# Patient Record
Sex: Female | Born: 1965 | Race: White | Hispanic: No | Marital: Single | Smoking: Current every day smoker
Health system: Southern US, Community
[De-identification: ages and names within clinical notes are randomized; demographics above are authoritative.]

## PROBLEM LIST (undated history)

## (undated) DIAGNOSIS — J449 Chronic obstructive pulmonary disease, unspecified: Secondary | ICD-10-CM

## (undated) DIAGNOSIS — A159 Respiratory tuberculosis unspecified: Secondary | ICD-10-CM

## (undated) DIAGNOSIS — J189 Pneumonia, unspecified organism: Secondary | ICD-10-CM

## (undated) DIAGNOSIS — F410 Panic disorder [episodic paroxysmal anxiety] without agoraphobia: Secondary | ICD-10-CM

## (undated) DIAGNOSIS — M549 Dorsalgia, unspecified: Secondary | ICD-10-CM

## (undated) DIAGNOSIS — G43909 Migraine, unspecified, not intractable, without status migrainosus: Secondary | ICD-10-CM

## (undated) DIAGNOSIS — F329 Major depressive disorder, single episode, unspecified: Secondary | ICD-10-CM

## (undated) DIAGNOSIS — F609 Personality disorder, unspecified: Secondary | ICD-10-CM

## (undated) DIAGNOSIS — F32A Depression, unspecified: Secondary | ICD-10-CM

## (undated) DIAGNOSIS — M542 Cervicalgia: Secondary | ICD-10-CM

## (undated) DIAGNOSIS — G8929 Other chronic pain: Secondary | ICD-10-CM

## (undated) DIAGNOSIS — R51 Headache: Secondary | ICD-10-CM

## (undated) DIAGNOSIS — O99519 Diseases of the respiratory system complicating pregnancy, unspecified trimester: Secondary | ICD-10-CM

## (undated) DIAGNOSIS — F319 Bipolar disorder, unspecified: Secondary | ICD-10-CM

## (undated) HISTORY — PX: NECK SURGERY: SHX720

## (undated) HISTORY — DX: Chronic obstructive pulmonary disease, unspecified: J44.9

## (undated) HISTORY — PX: APPENDECTOMY: SHX54

## (undated) HISTORY — DX: Diseases of the respiratory system complicating pregnancy, unspecified trimester: O99.519

## (undated) HISTORY — PX: ABDOMINAL HYSTERECTOMY: SHX81

## (undated) HISTORY — DX: Pneumonia, unspecified organism: J18.9

## (undated) HISTORY — PX: CHOLECYSTECTOMY: SHX55

## (undated) HISTORY — DX: Respiratory tuberculosis unspecified: A15.9

---

## 2001-06-10 ENCOUNTER — Emergency Department (HOSPITAL_COMMUNITY): Admission: EM | Admit: 2001-06-10 | Discharge: 2001-06-10 | Payer: Self-pay | Admitting: Emergency Medicine

## 2002-02-03 ENCOUNTER — Encounter: Payer: Self-pay | Admitting: Emergency Medicine

## 2002-02-03 ENCOUNTER — Emergency Department (HOSPITAL_COMMUNITY): Admission: EM | Admit: 2002-02-03 | Discharge: 2002-02-04 | Payer: Self-pay | Admitting: Emergency Medicine

## 2002-03-25 ENCOUNTER — Ambulatory Visit (HOSPITAL_COMMUNITY): Admission: RE | Admit: 2002-03-25 | Discharge: 2002-03-25 | Payer: Self-pay | Admitting: Family Medicine

## 2002-03-25 ENCOUNTER — Encounter: Payer: Self-pay | Admitting: Family Medicine

## 2002-04-02 ENCOUNTER — Ambulatory Visit (HOSPITAL_COMMUNITY): Admission: RE | Admit: 2002-04-02 | Discharge: 2002-04-02 | Payer: Self-pay | Admitting: Family Medicine

## 2002-04-02 ENCOUNTER — Encounter: Payer: Self-pay | Admitting: Family Medicine

## 2002-05-29 ENCOUNTER — Inpatient Hospital Stay (HOSPITAL_COMMUNITY): Admission: AD | Admit: 2002-05-29 | Discharge: 2002-05-30 | Payer: Self-pay | Admitting: Family Medicine

## 2002-05-30 ENCOUNTER — Encounter: Payer: Self-pay | Admitting: Family Medicine

## 2002-07-01 ENCOUNTER — Encounter: Payer: Self-pay | Admitting: Family Medicine

## 2002-07-01 ENCOUNTER — Ambulatory Visit (HOSPITAL_COMMUNITY): Admission: RE | Admit: 2002-07-01 | Discharge: 2002-07-01 | Payer: Self-pay | Admitting: Family Medicine

## 2003-01-13 ENCOUNTER — Other Ambulatory Visit: Admission: RE | Admit: 2003-01-13 | Discharge: 2003-01-13 | Payer: Self-pay | Admitting: Obstetrics & Gynecology

## 2003-04-30 ENCOUNTER — Encounter: Payer: Self-pay | Admitting: *Deleted

## 2003-04-30 ENCOUNTER — Emergency Department (HOSPITAL_COMMUNITY): Admission: EM | Admit: 2003-04-30 | Discharge: 2003-04-30 | Payer: Self-pay | Admitting: *Deleted

## 2003-05-09 ENCOUNTER — Ambulatory Visit (HOSPITAL_COMMUNITY): Admission: RE | Admit: 2003-05-09 | Discharge: 2003-05-09 | Payer: Self-pay | Admitting: Family Medicine

## 2003-05-12 ENCOUNTER — Encounter: Payer: Self-pay | Admitting: Family Medicine

## 2003-05-12 ENCOUNTER — Ambulatory Visit (HOSPITAL_COMMUNITY): Admission: RE | Admit: 2003-05-12 | Discharge: 2003-05-12 | Payer: Self-pay | Admitting: Family Medicine

## 2003-10-20 ENCOUNTER — Emergency Department (HOSPITAL_COMMUNITY): Admission: EM | Admit: 2003-10-20 | Discharge: 2003-10-21 | Payer: Self-pay | Admitting: Emergency Medicine

## 2003-10-21 ENCOUNTER — Emergency Department (HOSPITAL_COMMUNITY): Admission: EM | Admit: 2003-10-21 | Discharge: 2003-10-21 | Payer: Self-pay | Admitting: Emergency Medicine

## 2003-11-29 ENCOUNTER — Emergency Department (HOSPITAL_COMMUNITY): Admission: EM | Admit: 2003-11-29 | Discharge: 2003-11-29 | Payer: Self-pay | Admitting: Emergency Medicine

## 2004-02-04 ENCOUNTER — Ambulatory Visit (HOSPITAL_COMMUNITY): Admission: RE | Admit: 2004-02-04 | Discharge: 2004-02-04 | Payer: Self-pay | Admitting: Family Medicine

## 2004-02-09 ENCOUNTER — Emergency Department (HOSPITAL_COMMUNITY): Admission: EM | Admit: 2004-02-09 | Discharge: 2004-02-09 | Payer: Self-pay | Admitting: Emergency Medicine

## 2004-11-25 ENCOUNTER — Emergency Department (HOSPITAL_COMMUNITY): Admission: EM | Admit: 2004-11-25 | Discharge: 2004-11-25 | Payer: Self-pay | Admitting: Emergency Medicine

## 2004-11-26 ENCOUNTER — Ambulatory Visit: Payer: Self-pay | Admitting: Internal Medicine

## 2004-11-26 ENCOUNTER — Ambulatory Visit (HOSPITAL_COMMUNITY): Admission: RE | Admit: 2004-11-26 | Discharge: 2004-11-26 | Payer: Self-pay | Admitting: Internal Medicine

## 2005-02-13 ENCOUNTER — Emergency Department (HOSPITAL_COMMUNITY): Admission: EM | Admit: 2005-02-13 | Discharge: 2005-02-13 | Payer: Self-pay | Admitting: Emergency Medicine

## 2005-03-27 ENCOUNTER — Emergency Department (HOSPITAL_COMMUNITY): Admission: EM | Admit: 2005-03-27 | Discharge: 2005-03-27 | Payer: Self-pay | Admitting: *Deleted

## 2005-05-08 ENCOUNTER — Emergency Department (HOSPITAL_COMMUNITY): Admission: EM | Admit: 2005-05-08 | Discharge: 2005-05-09 | Payer: Self-pay | Admitting: Emergency Medicine

## 2005-08-11 ENCOUNTER — Emergency Department (HOSPITAL_COMMUNITY): Admission: EM | Admit: 2005-08-11 | Discharge: 2005-08-11 | Payer: Self-pay | Admitting: Emergency Medicine

## 2005-08-29 ENCOUNTER — Emergency Department (HOSPITAL_COMMUNITY): Admission: EM | Admit: 2005-08-29 | Discharge: 2005-08-29 | Payer: Self-pay | Admitting: Emergency Medicine

## 2005-09-19 ENCOUNTER — Ambulatory Visit (HOSPITAL_COMMUNITY): Admission: RE | Admit: 2005-09-19 | Discharge: 2005-09-19 | Payer: Self-pay | Admitting: Family Medicine

## 2006-02-15 ENCOUNTER — Emergency Department (HOSPITAL_COMMUNITY): Admission: EM | Admit: 2006-02-15 | Discharge: 2006-02-16 | Payer: Self-pay | Admitting: Emergency Medicine

## 2006-02-17 ENCOUNTER — Emergency Department (HOSPITAL_COMMUNITY): Admission: EM | Admit: 2006-02-17 | Discharge: 2006-02-17 | Payer: Self-pay | Admitting: Emergency Medicine

## 2006-05-09 ENCOUNTER — Emergency Department (HOSPITAL_COMMUNITY): Admission: EM | Admit: 2006-05-09 | Discharge: 2006-05-10 | Payer: Self-pay | Admitting: Emergency Medicine

## 2006-06-14 ENCOUNTER — Emergency Department (HOSPITAL_COMMUNITY): Admission: EM | Admit: 2006-06-14 | Discharge: 2006-06-14 | Payer: Self-pay | Admitting: Emergency Medicine

## 2006-07-20 ENCOUNTER — Emergency Department (HOSPITAL_COMMUNITY): Admission: EM | Admit: 2006-07-20 | Discharge: 2006-07-20 | Payer: Self-pay | Admitting: Emergency Medicine

## 2006-08-10 ENCOUNTER — Ambulatory Visit (HOSPITAL_COMMUNITY): Admission: RE | Admit: 2006-08-10 | Discharge: 2006-08-10 | Payer: Self-pay | Admitting: Family Medicine

## 2006-09-15 ENCOUNTER — Ambulatory Visit (HOSPITAL_COMMUNITY): Admission: RE | Admit: 2006-09-15 | Discharge: 2006-09-16 | Payer: Self-pay | Admitting: Neurosurgery

## 2006-10-24 ENCOUNTER — Emergency Department (HOSPITAL_COMMUNITY): Admission: EM | Admit: 2006-10-24 | Discharge: 2006-10-24 | Payer: Self-pay | Admitting: Emergency Medicine

## 2006-11-04 ENCOUNTER — Emergency Department (HOSPITAL_COMMUNITY): Admission: EM | Admit: 2006-11-04 | Discharge: 2006-11-04 | Payer: Self-pay | Admitting: Emergency Medicine

## 2006-12-04 ENCOUNTER — Emergency Department (HOSPITAL_COMMUNITY): Admission: EM | Admit: 2006-12-04 | Discharge: 2006-12-04 | Payer: Self-pay | Admitting: Emergency Medicine

## 2006-12-09 ENCOUNTER — Emergency Department (HOSPITAL_COMMUNITY): Admission: EM | Admit: 2006-12-09 | Discharge: 2006-12-09 | Payer: Self-pay | Admitting: Emergency Medicine

## 2007-01-18 ENCOUNTER — Emergency Department (HOSPITAL_COMMUNITY): Admission: EM | Admit: 2007-01-18 | Discharge: 2007-01-18 | Payer: Self-pay | Admitting: Emergency Medicine

## 2007-02-09 ENCOUNTER — Emergency Department (HOSPITAL_COMMUNITY): Admission: EM | Admit: 2007-02-09 | Discharge: 2007-02-09 | Payer: Self-pay | Admitting: Emergency Medicine

## 2007-02-25 ENCOUNTER — Emergency Department (HOSPITAL_COMMUNITY): Admission: EM | Admit: 2007-02-25 | Discharge: 2007-02-25 | Payer: Self-pay | Admitting: Emergency Medicine

## 2007-04-14 ENCOUNTER — Emergency Department: Payer: Self-pay | Admitting: Emergency Medicine

## 2007-04-21 ENCOUNTER — Emergency Department: Payer: Self-pay | Admitting: Emergency Medicine

## 2007-04-26 ENCOUNTER — Emergency Department: Payer: Self-pay | Admitting: Internal Medicine

## 2007-05-03 ENCOUNTER — Emergency Department: Payer: Self-pay | Admitting: Emergency Medicine

## 2007-05-07 ENCOUNTER — Emergency Department: Payer: Self-pay | Admitting: Emergency Medicine

## 2007-05-21 ENCOUNTER — Emergency Department: Payer: Self-pay | Admitting: Emergency Medicine

## 2007-07-11 ENCOUNTER — Emergency Department (HOSPITAL_COMMUNITY): Admission: EM | Admit: 2007-07-11 | Discharge: 2007-07-11 | Payer: Self-pay | Admitting: Emergency Medicine

## 2007-12-16 ENCOUNTER — Emergency Department (HOSPITAL_COMMUNITY): Admission: EM | Admit: 2007-12-16 | Discharge: 2007-12-16 | Payer: Self-pay | Admitting: Emergency Medicine

## 2008-01-27 ENCOUNTER — Emergency Department (HOSPITAL_COMMUNITY): Admission: EM | Admit: 2008-01-27 | Discharge: 2008-01-27 | Payer: Self-pay | Admitting: Emergency Medicine

## 2008-05-27 ENCOUNTER — Emergency Department (HOSPITAL_COMMUNITY): Admission: EM | Admit: 2008-05-27 | Discharge: 2008-05-27 | Payer: Self-pay | Admitting: Emergency Medicine

## 2008-10-31 ENCOUNTER — Emergency Department (HOSPITAL_COMMUNITY): Admission: EM | Admit: 2008-10-31 | Discharge: 2008-10-31 | Payer: Self-pay | Admitting: Emergency Medicine

## 2009-03-29 ENCOUNTER — Emergency Department (HOSPITAL_COMMUNITY): Admission: EM | Admit: 2009-03-29 | Discharge: 2009-03-29 | Payer: Self-pay | Admitting: Emergency Medicine

## 2009-04-25 ENCOUNTER — Emergency Department (HOSPITAL_COMMUNITY): Admission: EM | Admit: 2009-04-25 | Discharge: 2009-04-26 | Payer: Self-pay | Admitting: Emergency Medicine

## 2009-05-13 ENCOUNTER — Emergency Department (HOSPITAL_COMMUNITY): Admission: EM | Admit: 2009-05-13 | Discharge: 2009-05-13 | Payer: Self-pay | Admitting: Emergency Medicine

## 2010-03-03 ENCOUNTER — Emergency Department (HOSPITAL_COMMUNITY): Admission: EM | Admit: 2010-03-03 | Discharge: 2010-03-03 | Payer: Self-pay | Admitting: Emergency Medicine

## 2010-05-13 ENCOUNTER — Emergency Department (HOSPITAL_COMMUNITY): Admission: EM | Admit: 2010-05-13 | Discharge: 2010-05-13 | Payer: Self-pay | Admitting: Emergency Medicine

## 2010-07-23 ENCOUNTER — Emergency Department (HOSPITAL_COMMUNITY)
Admission: EM | Admit: 2010-07-23 | Discharge: 2010-07-23 | Payer: Self-pay | Source: Home / Self Care | Admitting: Emergency Medicine

## 2010-08-22 ENCOUNTER — Encounter: Payer: Self-pay | Admitting: Family Medicine

## 2010-10-11 LAB — LITHIUM LEVEL: Lithium Lvl: 0.61 mEq/L — ABNORMAL LOW (ref 0.80–1.40)

## 2010-10-13 LAB — COMPREHENSIVE METABOLIC PANEL
ALT: 12 U/L (ref 0–35)
Albumin: 3.8 g/dL (ref 3.5–5.2)
Chloride: 107 mEq/L (ref 96–112)
GFR calc Af Amer: >60 mL/min (ref >60)
Glucose, Bld: 94 mg/dL (ref 70–99)
Potassium: 3.5 mEq/L (ref 3.5–5.1)
Total Protein: 6.6 g/dL (ref 6.0–8.3)

## 2010-10-13 LAB — DIFFERENTIAL
Basophils Relative: 1 % (ref 0–1)
Eosinophils Absolute: 0.1 10*3/uL (ref 0.0–0.7)
Lymphs Abs: 3.1 10*3/uL (ref 0.7–4.0)
Neutro Abs: 7.5 10*3/uL (ref 1.7–7.7)

## 2010-10-13 LAB — D-DIMER, QUANTITATIVE: D-Dimer, Quant: 0.22 ug/mL-FEU (ref 0.00–0.48)

## 2010-10-13 LAB — CBC
HCT: 42.2 % (ref 36.0–46.0)
Hemoglobin: 14.5 g/dL (ref 12.0–15.0)
MCH: 32.5 pg (ref 26.0–34.0)
MCV: 94.8 fL (ref 78.0–100.0)
Platelets: 198 10*3/uL (ref 150–400)

## 2010-10-13 LAB — POCT CARDIAC MARKERS: Troponin i, poc: <0.05 ng/mL (ref 0.00–0.09)

## 2010-11-06 LAB — POCT CARDIAC MARKERS
CKMB, poc: <1 ng/mL — ABNORMAL LOW (ref 1.0–8.0)
Myoglobin, poc: 17.5 ng/mL (ref 12–200)

## 2010-11-06 LAB — CBC
HCT: 42 % (ref 36.0–46.0)
MCV: 95.7 fL (ref 78.0–100.0)
RDW: 13.8 % (ref 11.5–15.5)
WBC: 9.7 10*3/uL (ref 4.0–10.5)

## 2010-11-06 LAB — COMPREHENSIVE METABOLIC PANEL
ALT: 13 U/L (ref 0–35)
AST: 16 U/L (ref 0–37)
BUN: 12 mg/dL (ref 6–23)
Calcium: 9.1 mg/dL (ref 8.4–10.5)
Creatinine, Ser: 0.7 mg/dL (ref 0.4–1.2)
GFR calc Af Amer: >60 mL/min (ref >60)
GFR calc non Af Amer: >60 mL/min (ref >60)
Potassium: 3.9 mEq/L (ref 3.5–5.1)
Sodium: 136 mEq/L (ref 135–145)
Total Protein: 6 g/dL (ref 6.0–8.3)

## 2010-11-06 LAB — DIFFERENTIAL
Eosinophils Absolute: 0.1 10*3/uL (ref 0.0–0.7)
Neutro Abs: 6.3 10*3/uL (ref 1.7–7.7)
Neutrophils Relative %: 65 % (ref 43–77)

## 2010-11-25 ENCOUNTER — Emergency Department (HOSPITAL_COMMUNITY): Payer: Self-pay

## 2010-11-25 ENCOUNTER — Emergency Department (HOSPITAL_COMMUNITY)
Admission: EM | Admit: 2010-11-25 | Discharge: 2010-11-25 | Disposition: A | Discharge status: Home / Self Care | Payer: Self-pay | Attending: Emergency Medicine | Admitting: Emergency Medicine

## 2010-11-25 DIAGNOSIS — G43909 Migraine, unspecified, not intractable, without status migrainosus: Secondary | ICD-10-CM | POA: Insufficient documentation

## 2010-11-25 DIAGNOSIS — R110 Nausea: Secondary | ICD-10-CM | POA: Insufficient documentation

## 2010-11-25 DIAGNOSIS — H53149 Visual discomfort, unspecified: Secondary | ICD-10-CM | POA: Insufficient documentation

## 2010-12-17 NOTE — Consult Note (Signed)
NAME:  Kara Kelly                        ACCOUNT NO.:  [id number]   MEDICAL RECORD NO.:  [id number]                   PATIENT TYPE:  INP   LOCATION:  A327                                 FACILITY:  [facility]   PHYSICIAN:  Dennie Maizes, M.D.                DATE OF BIRTH:  02-07-1966   DATE OF CONSULTATION:  DATE OF DISCHARGE:  05/30/2002                                   CONSULTATION   REASON FOR CONSULTATION:  Severe right flank pain.   HISTORY OF PRESENT ILLNESS:  This 45 year old female complains of sudden  onset of severe right flank pain radiating to the front for 1-day.  This  pain was very severe and she was unable to bear the pain. She had associated  nausea and vomiting and mild dysuria.  The patient also had low-grade fever.  There is no history of voiding difficulty or gross hematuria.  There is no  past history of urolithiasis.  She has been treated for recurrent urinary  tract infections possible pyelonephritis during the past 6 weeks.  She has  had 2 courses of antibiotics.   PAST MEDICAL HISTORY:  History of bipolar disorder, migraine, status post  hysterectomy, cholecystectomy, appendectomy.   MEDICATIONS:  1. Eskalith 440 mg 1 p.o. b.i.d.  2. Depakote 500 mg p.o. b.i.d.  3. Xanax 1 mg p.o. t.i.d. p.r.n. anxiety.   ALLERGIES:  SULFA and DARVOCET.  She has side effects with SERZONE, BUSPAR,  PAMELOR, ZOLOFT, AND WELLBUTRIN.   FAMILY HISTORY:  Positive for breast cancer, hypertension, coronary artery  disease, and hyperlipidemia.   PHYSICAL EXAMINATION:  GENERAL:  The patient was found to be in severe pain.  ABDOMEN:  Soft, no palpable flank mass.  Moderate right CVA tenderness was  noted.  No suprapubic tenderness was noted.  GENITOURINARY:  A Foley catheter was inserted and 50 cc of clear urine was  drained.   X-RAY AND LABORATORY DATA:  Admission labs:  CBC normal, WBC normal, red  cells normal. Urinalysis in the office revealed white blood cells and  red  blood cells.  Urine culture and sensitivity have been done and the patient  has been started on IV Rocephin.   IMPRESSION:  1. Right renal colic.  2. Possible acute pyelonephritis.   PLAN:  1. Await urine culture and sensitivity.  2.     An IVP has been scheduled for 05/30/02.  We will review the x-rays and     consider course and decide about further evaluation and management.   Thanks for this consult.                                               Dennie Maizes, M.D.    SK/MEDQ  D:  05/30/2002  T:  05/31/2002  Job:  [id number]   cc:   Donna Bernard, M.D.  [address]  [place]  [place] [address]  Fax: [phone]

## 2010-12-17 NOTE — H&P (Signed)
NAME:  Kara Kelly                        ACCOUNT NO.:  [id number]   MEDICAL RECORD NO.:  [id number]                   PATIENT TYPE:  INP   LOCATION:  A327                                 FACILITY:  [facility]   PHYSICIAN:  Donna Bernard, M.D.             DATE OF BIRTH:  Mar 13, 1966   DATE OF ADMISSION:  05/29/2002  DATE OF DISCHARGE:                                HISTORY & PHYSICAL   CHIEF COMPLAINT:  Severe flank pain with sudden onset.   SUBJECTIVE:  This patient is a 45 year old white female with history of  recent urinary tract infections and bipolar affective disorder who presents  to the office the day of admission with complaints of severe pain.  The  patient states the pain came on rather suddenly this morning.  She states  she was doing reasonably well yesterday.  The pain is in the flank radiating  on across the abdomen and down into the right groin area.  It is very severe  in nature and she finds herself moving about, unable to sit still, with  severe pain.  The patient did have nausea and also developed vomiting.  Recently she has been seen for a couple of episodes of pyelonephritis.  In  fact, she was scheduled to see the urologist, Dr. Jerre Simon, on June 17, 2002.   MEDICATIONS:  The patient claims compliance with her usual medications,  which include:  1. Eskalith 450 mg one p.o. b.i.d.  2. Depakote 500 mg one p.o. b.i.d.  3. Xanax 1 mg t.i.d. p.r.n. with the patient stating she is trying to wean     herself off of this.   ALLERGIES:  SULFA, DARVOCET.  Side effects:  SERZONE, BUSPAR, PAMELOR,  ZOLOFT, WELLBUTRIN      .   PRIOR SURGERY:  Remote hysterectomy, cholecystectomy, appendectomy,  endometrial surgery.   FAMILY HISTORY:  Positive for breast cancer, hypertension, coronary artery  disease, hyperlipidemia.   PAST MEDICAL HISTORY:  Significant for diagnosis of bipolar affective  disorder followed by a psychiatrist for this.  Also has a history of  migraines.   SOCIAL HISTORY:  The patient smokes, no alcohol abuse.  One child, married.  Works as a Scientist, research (medical).   REVIEW OF SYSTEMS:  Otherwise negative.   PHYSICAL EXAMINATION:  VITAL SIGNS:  Stable, afebrile, blood pressure  140/90.  GENERAL:  The patient is in acute distress, holding her right flank, pacing  about, crying with the pain.  HEENT:  Normal.  NECK:  Supple.  LUNGS:  Clear.  HEART:  Regular rate and rhythm.  ABDOMEN:  Some right CVA tenderness noted.  Some right lateral abdominal and  right lower quadrant tenderness noted.  No rebound.  Good bowel sounds.  EXTREMITIES:  Thin.   SIGNIFICANT LABORATOR DATA:  CBC:  White blood count normal.  MET-7 normal.  UA in the office is nonspecific with 2-3 epithelial cells, white blood  cells, and red blood cells.   IMPRESSION:  1. Acute onset right flank pain with colicky presentation and history of a     couple of recent mild pyelonephritis-type presentations in the same area.     Urinalyses on these other visits have been more impressive as far as     infection; this one is not.  In addition, this patient in this particular     presentation is more of a kidney stone-type pain rather than an     inflammatory pyelonephritis-type pain.  This was discussed with the     patient.  2. Bipolar disorder.   PLAN:  As per orders.                                                Donna Bernard, M.D.    Karie Chimera  D:  05/29/2002  T:  05/30/2002  Job:  [id number]

## 2010-12-17 NOTE — Op Note (Signed)
Kara Kelly, Kara Kelly              ACCOUNT NO.:  [id number]   MEDICAL RECORD NO.:  [id number]          PATIENT TYPE:  AMB   LOCATION:  DAY                           FACILITY:  [facility]   PHYSICIAN:  R. Roetta Sessions, M.D. DATE OF BIRTH:  [birth year]   DATE OF PROCEDURE:  11/26/2004  DATE OF DISCHARGE:                                 OPERATIVE REPORT   PROCEDURE:  Diagnostic esophagogastroduodenoscopy.   INDICATIONS FOR PROCEDURE:  The patient is a 45 year old lady with history  of chronic gastroesophageal reflux disease who has not been on acid  suppression therapy in some time who over the past four weeks has had  retrosternal chest pain intermittently and worsening of her reflux symptoms  and the perception she is not able to swallow anything including solid fluid  and liquid. She is able to swallow pills, however. Also apparently she has  had some suprapubic pain intermittently which Dr. Nobie Putnam has ordered an  abdominal and pelvic CT electively.   Kara Kelly is not having any abdominal pain today. She has not had any  melena or rectal bleeding. She denies taking any antibiotics recently. She  tells me she pretty much stays upright and drinks adequate fluids when she  does swallow medications. She tells me Dr. Corinda Gubler and associates down in  [place] several years ago around 1996 performed an EGD and said she would  have to get her esophagus stretched one day. EGD is now being done to  further evaluate her symptoms. This approach has been discussed with the  patient at length. Potential risks, benefits, and alternatives have been  reviewed and questions answered. She is agreeable. Please see documentation  in the medical record.   PROCEDURE NOTE:  O2 saturation, blood pressure, pulse, and respirations were  monitored throughout the entire procedure. Conscious sedation with Versed 8  mg IV and Demerol 200 mg IV in divided doses. Cetacaine spray for topical  oropharyngeal  anesthesia.   INSTRUMENT:  Olympus video chip system.   FINDINGS:  Examination of the tubular esophagus revealed a couple of tiny  distal esophageal erosions. Esophageal mucosa otherwise appeared entirely  normal, and tubular esophagus was widely patent through the ED junction. EG  junction was easily traversed.   Stomach:  Gastric cavity was empty and insufflated well with air. Thorough  examination of gastric mucosa including retroflexed view of the proximal  stomach and esophagogastric junction demonstrated multiple linear erosions.  There was no infiltrating process or frank ulcer crater. The pylorus was  patent and easily traversed. Examination of the bulb and second portion  revealed no abnormalities.   THERAPEUTIC/DIAGNOSTIC MANEUVERS:  None.   The patient tolerated the procedure well and was reactive to endoscopy.   IMPRESSION:  1.  Distal esophageal erosions consistent with erosive reflux esophagitis      (mild). Otherwise normal tubular esophagus.  2.  Multiple linear antral erosions. Otherwise normal stomach. Normal D1 and      D2.   RECOMMENDATIONS:  1.  Check Helicobacter pylori serology today.  2.  Begin Prevacid 30 mg SoluTab one on the tongue each  morning before      breakfast. She is to go by my office for a three-week supply of free      samples.  3.  Recommend she continue to follow through on workup orchestrated by Dr.      Nobie Putnam for her suprapubic discomfort with CT scan as scheduled per Dr.      Nobie Putnam. Of note, she had labs done on April 26. Her white count was      normal at 6,000, H and H 14.5 and 41.0, MCV 93.3. Electrolytes look      good. HCG quantitative urine was negative. Urinalysis was also negative.   We will make arrangements to see this nice lady back in our office in three  weeks so we can reassess her chest pain/dysphagia symptoms.      RMR/MEDQ  D:  11/26/2004  T:  11/26/2004  Job:  [id number]   cc:   Patrica Duel, M.D.  [address]  [place]  [place] [address]  Fax: [phone]

## 2010-12-17 NOTE — Discharge Summary (Signed)
   NAME:  Kara Kelly                        ACCOUNT NO.:  [id number]   MEDICAL RECORD NO.:  [id number]                   PATIENT TYPE:  INP   LOCATION:  A327                                 FACILITY:  [facility]   PHYSICIAN:  Scott A. Gerda Diss, M.D.               DATE OF BIRTH:  1966/01/22   DATE OF ADMISSION:  05/29/2002  DATE OF DISCHARGE:  05/30/2002                                 DISCHARGE SUMMARY   DISCHARGE DIAGNOSES:  Kidney stone with complete ureter obstruction.   HOSPITAL COURSE:  A 45 year old white female admitted in with severe renal  colic pain.  Had an IVP ordered.  Unable to get it done until the morning of  October 30.  It showed complete right kidney obstruction.  Dr. Rito Ehrlich was  consulted.  Saw the patient on the 29th and again on the 30th.  On the 30th  he did cystoscopy with retrograde pyelogram and stone extraction and stent  placement.  There were no complications.  The patient was feeling much  better that evening.  She was discharged to home on Percocet one q.4h.  p.r.n. severe pain, caution drowsiness, Cipro XR 500 one daily for seven  days.  She is to call if ongoing troubles or problems.  Follow up with Dr.  Lilyan Punt on a p.r.n. basis and follow up with Dr. Rito Ehrlich in one week.                                               Scott A. Gerda Diss, M.D.    Linus Orn  D:  05/30/2002  T:  05/31/2002  Job:  [id number]

## 2010-12-17 NOTE — Op Note (Signed)
[name]                          ACCOUNT NO.:  [id number]   MEDICAL RECORD NO.:  [id number]                    PATIENT TYPE:   LOCATION:                                       FACILITY:   PHYSICIAN:  Dennie Maizes, M.D.                DATE OF BIRTH:   DATE OF PROCEDURE:  05/30/2002  DATE OF DISCHARGE:                                 OPERATIVE REPORT   PREOPERATIVE DIAGNOSES:  1. Right distal ureteral calculus with obstruction.  2. Right renal colic.  3. Right hydronephrosis.   POSTOPERATIVE DIAGNOSES:  1. Right distal ureteral calculus with obstruction.  2. Right renal colic.  3. Right hydronephrosis.   PROCEDURE:  1. Cystoscopy.  2. Right retrograde pyelogram.  3. Ureteroscopic stone extraction.  4. Right ureteral stent placement.   SURGEON:  Dennie Maizes, M.D.   ANESTHESIA:  General   COMPLICATIONS:  None.   SPECIMENS:  A 2-mm size ureteral stone.   DRAINS:  A 6 French, 26-cm size right ureteral stent with a string.   INDICATIONS FOR PROCEDURE:  This 45 year old female was admitted to the  hospital with severe right flank pain.  Evaluation revealed a small right  distal ureteral calculus with obstruction and hydronephrosis.  The patient  was taken to the OR today for cystoscopy, right retrograde pyelogram,  ureteroscopic stone extraction, and right ureteral stent placement.   DESCRIPTION OF PROCEDURE:  General anesthesia was induced and the patient  was placed on the operating room table in the dorsal lithotomy position.  The lower abdomen and genitalia were prepped and draped in a sterile  fashion.  Cystoscopy was done with the 25 [place] scope and the appearance of  the bladder was normal.  The trigone, ureteral orifices, and bladder mucosa  were unremarkable.   A 5 French wedge catheter was then placed in the right ureteral orifice.  Contrast was injected and a retrograde pyelogram was done with C-arm  fluoroscopy.  There was a small filling  defect in the distal ureter.  The  ureter and renal pelvis were moderately dilated.  The 5 French open-ended  retractor was placed in the ureteral orifice.  A 0.038-inch Benson guidewire  with the flexible tip was then advanced into the renal pelvis.  The open-  ended catheter was then removed.  The distal ureter was then dilated using  an 54 French balloon dilating catheter.  The balloon dilating catheter was  then removed leaving the guidewire in place.   Ureteroscopy was done with the 9.5 French rigid ureteroscope.  Small stone  fragment was noted in the distal ureter.  This was retrieved with the 4 wire  basket.  I feel that the stone fragmented during the removal and one of the  fragments moved into the upper collecting system.  Examination of the ureter  was done up to the level of the ureteropelvic junction.  No other stone  fragment was noted. The instruments were removed.   A 6 French 26-cm size stent was then inserted over the guidewire and placed  in the right collecting system.  The patient was transferred to the PACU in  satisfactory condition.                                               Dennie Maizes, M.D.    SK/MEDQ  D:  05/30/2002  T:  05/30/2002  Job:  [id number]   cc:   Donna Bernard, M.D.  [address]  [place]  [place] [address]  Fax: [phone]

## 2010-12-17 NOTE — Op Note (Signed)
NAMESHOMARI, MATUSIK              ACCOUNT NO.:  [id number]   MEDICAL RECORD NO.:  [id number]          PATIENT TYPE:  AMB   LOCATION:  SDS                          FACILITY:  [facility]   PHYSICIAN:  Coletta Memos, M.D.     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   POSTOPERATIVE DIAGNOSIS:  Cervical spondylosis without myelopathy C5-C6  and C6-C7, cervical radiculopathy C6, cervical radiculopathy C7.   PROCEDURE:  1. Anterior cervical decompression C5-C6 and C6-C7.  2. Arthrodesis C5-C6 with 7 mm ACF bone, 7 mm allograft also at C6-C7.  3. Anterior instrumentation 32 mm Vector plate with 14 mm screws, two      in C5, two in C6, two in C7.   COMPLICATIONS:  None.   SURGEON:  Coletta Memos, M.D.   ASSISTANT:  Clydene Fake, M.D.   INDICATIONS:  Ms. Zayra Devito presented with severe pain in the left  upper extremity.  The pain has been ongoing and refractory to  conservative treatment.  I, therefore, recommended and she agreed to  undergo operative decompression.  She did have weakness in her triceps  at approximately 4/5.   OPERATIVE NOTE:  [name] was brought to the operating room,  intubated, and placed under general anesthetic.  Her head was placed on  a horseshoe retractor in essentially neutral position.  Her neck was  prepped and she was draped in a sterile fashion.  I infiltrated 4 mL  0.5% lidocaine with 1:200,000 epinephrine, starting from the midline  extending to the medial border of the left sternocleidomastoid over to  the cricothyroid cartilage.  I opened the skin with a #10 blade and I  took this down sharply to the platysma.  I dissected in a plane above  the platysma rostrally and caudally.  I opened the platysma in a  horizontal  fashion.  I then dissected in a plane inferior to the  platysma rostrally and caudally.  I dissected via both sharp and blunt  techniques to create an avascular corridor to the cervical spine.  I  placed a spinal needle.  An x-ray showed spinal needle to be in the disc  space between C5 and C6.  I then reflected the longus colli muscles  bilaterally and placed a self-retaining retractor.   For the decompression, I opened both disc spaces with a #15 blade then  removed disc material in a progressive fashion using pituitary rongeurs,  curets, Kerrison punches, and a high speed drill, which was used to  remove osteophytes.  After thoroughly decompressing distally the C6-C7  disc space, distraction pins had been placed and the good space opened  up.  I then decompressed both C7 nerve roots bilaterally.  After the  decompression, I did achieve hemostasis.  I then prepared the C6-C7  space for arthrodesis.   Using a high speed drill, I shaved down the endplates and removed soft  tissue  and also evened out both the C6 and C7 interspace.  I then placed  a 7 mm lordotic allograft into that space.  I removed the distraction  pin from C7.  I did place some Gelfoam along the outer borders.  I then  turned my attention to decompression of C5-C6.  I placed a distraction  pin into C5 and opened the disc space.  Again, I removed the disc in a  progressive fashion.  I identified the posterior longitudinal ligament,  opened that, and decompressed both C6 nerve roots thoroughly along with  the spinal canal.  After decompression, I then prepared for arthrodesis.   For the arthrodesis at C5-C6, I shaved down the endplates, removed soft  tissue and again evened them out.  For the arthrodesis, I then placed a  7 mm allograft which again were lordotic Synthes bone.  I removed the  distraction pins at C5 and C6.  I then prepared for instrumentation.  I  placed a 32 mm plate and it seemed to fit.  So I then  placed two screws  with Dr. Doreen Beam assistance in C5, C6 and C7.  Each pilot hole was  first drilled and self-tapping screws were used.  X-ray showed the  plate, plug and screws to be in good position.  I then closed the wound  in layered fashion using Vicryl sutures to reapproximate the platysma  and then subsequently subcuticular layer.  Dermabond was used for a  sterile dressing.           ______________________________  Coletta Memos, M.D.     KC/MEDQ  D:  09/15/2006  T:  09/15/2006  Job:  [id number]

## 2010-12-17 NOTE — Procedures (Signed)
   NAME:  Kara Kelly, Kara Kelly                        ACCOUNT NO.:  [id number]   MEDICAL RECORD NO.:  [id number]                   PATIENT TYPE:  OUT   LOCATION:  DFTL                                 FACILITY:  [facility]   PHYSICIAN:  Scott A. Gerda Diss, M.D.               DATE OF BIRTH:  21-Apr-1966   DATE OF PROCEDURE:  DATE OF DISCHARGE:                                    STRESS TEST   INDICATIONS:  Chest discomfort with risk factors.   Resting EKG:  Normal sinus rhythm. No acute ST segment changes.   Protocol:  Bruce protocol.   ST segment response to exercise:  The patient did not have any significant  ST segment depressions noted. No sign of any ischemia.   Arrhythmias:  None.   Blood pressure response to exercise:  The patient's blood pressure went up  gradually during the test but was not a hypertensive response.   Recovery phase:  Quick and uneventful.   The patient exercised for approximately 11 minutes, and the reason the test  was stopped was because target heart rate was met plus also dyspnea.   INTERPRETATION:  Negative stress test.      ___________________________________________                                            Jonna Coup. Gerda Diss, M.D.   Linus Orn  D:  05/09/2003  T:  05/09/2003  Job:  [id number]

## 2011-03-16 ENCOUNTER — Other Ambulatory Visit: Payer: Self-pay

## 2011-03-16 ENCOUNTER — Emergency Department (HOSPITAL_COMMUNITY): Payer: Self-pay

## 2011-03-16 ENCOUNTER — Emergency Department (HOSPITAL_COMMUNITY)
Admission: EM | Admit: 2011-03-16 | Discharge: 2011-03-16 | Disposition: A | Discharge status: Home / Self Care | Payer: Self-pay | Attending: Emergency Medicine | Admitting: Emergency Medicine

## 2011-03-16 DIAGNOSIS — F17200 Nicotine dependence, unspecified, uncomplicated: Secondary | ICD-10-CM | POA: Insufficient documentation

## 2011-03-16 DIAGNOSIS — F411 Generalized anxiety disorder: Secondary | ICD-10-CM | POA: Insufficient documentation

## 2011-03-16 HISTORY — DX: Major depressive disorder, single episode, unspecified: F32.9

## 2011-03-16 HISTORY — DX: Depression, unspecified: F32.A

## 2011-03-16 HISTORY — DX: Panic disorder (episodic paroxysmal anxiety): F41.0

## 2011-03-16 LAB — CBC
Platelets: 243 10*3/uL (ref 150–400)
RBC: 4.7 MIL/uL (ref 3.87–5.11)
WBC: 9.3 10*3/uL (ref 4.0–10.5)

## 2011-03-16 LAB — D-DIMER, QUANTITATIVE: D-Dimer, Quant: 0.22 ug/mL-FEU (ref 0.00–0.48)

## 2011-03-16 LAB — CARDIAC PANEL(CRET KIN+CKTOT+MB+TROPI)
CK, MB: 1.7 ng/mL (ref 0.3–4.0)
Relative Index: INVALID (ref 0.0–2.5)
Total CK: 38 U/L (ref 7–177)
Troponin I: <0.3 ng/mL (ref <0.30)

## 2011-03-16 LAB — BASIC METABOLIC PANEL
CO2: 20 mEq/L (ref 19–32)
Chloride: 104 mEq/L (ref 96–112)
Sodium: 137 mEq/L (ref 135–145)

## 2011-03-16 MED ORDER — LORAZEPAM 2 MG/ML IJ SOLN
2.0000 mg | Freq: Once | INTRAMUSCULAR | Status: AC
  Administered 2011-03-16: 2 mg via INTRAVENOUS
  Filled 2011-03-16: qty 1

## 2011-03-16 MED ORDER — HYDROMORPHONE HCL 1 MG/ML IJ SOLN
1.0000 mg | Freq: Once | INTRAMUSCULAR | Status: AC
  Administered 2011-03-16: 1 mg via INTRAVENOUS
  Filled 2011-03-16: qty 1

## 2011-03-16 MED ORDER — ONDANSETRON HCL 4 MG/2ML IJ SOLN
4.0000 mg | Freq: Once | INTRAMUSCULAR | Status: AC
  Administered 2011-03-16: 4 mg via INTRAVENOUS
  Filled 2011-03-16: qty 2

## 2011-03-16 MED ORDER — ASPIRIN 325 MG PO TABS
325.0000 mg | ORAL_TABLET | Freq: Once | ORAL | Status: AC
  Administered 2011-03-16: 325 mg via ORAL
  Filled 2011-03-16: qty 1

## 2011-03-16 MED ORDER — LORAZEPAM 1 MG PO TABS: 1.0000 mg | ORAL_TABLET | Freq: Three times a day (TID) | ORAL | Status: AC | PRN

## 2011-03-16 MED ORDER — SODIUM CHLORIDE 0.9 % IV SOLN
INTRAVENOUS | Status: DC
  Administered 2011-03-16: 09:00:00 via INTRAVENOUS

## 2011-03-16 NOTE — ED Notes (Signed)
Pt up to restroom, states that she is still nervous, notified edp.

## 2011-03-16 NOTE — ED Notes (Signed)
A&Ox3. Skin w/p/d. Resp even and unlabored. Complaining of Headache 10/10 and feeling anxious at this time.

## 2011-03-16 NOTE — ED Notes (Signed)
Pt reports cp since last night.  Pt has hx of panic attacks, but states "this feels so much worse".  Pt is extremely anxious.  Pt is tearful, needed me to open doors completely and is holding on to her call light.  Pt states " please don't leave me alone very long".

## 2011-03-16 NOTE — ED Provider Notes (Signed)
History    Scribed for Shelda Jakes, MD, the patient was seen in room APA06/APA06. This chart was scribed by Clarita Crane. This patient's care was started at 9:02AM.  CSN: [id number] Arrival date & time: 03/16/2011  8:11 AM  Chief Complaint  Patient presents with  . Chest Pain   HPI Patient is a 45 year old female c/o constant sternal chest pain radiating to neck described as burning like "my chest was on fire" onset approximately 10 hours ago while resting at home and persistent since with associated tingling and numbness of bilateral upper extremities and feet, SOB, minimal swelling of bilateral hands and diarrhea-3x this AM. Rates chest pain a 9/10 currently and 9/10 at its worst. Patient also notes feeling nauseated when she has become increasingly worried or upset since onset of chest pain. Patient describes diarrhea as loose stools. Denies blood in stool, abdominal pain, dysuria, swelling of lower extremities, HA, back pain and previous history of similar symptoms. States chest pain is not aggravated or relieved by anything. Patient states current symptoms are not similar to those previously experienced with panic attacks. Reports h/o migraines, depression, panic attack, appendectomy, cholecystectomy, abdominal hysterectomy.     HPI ELEMENTS: Location: sternum Onset: last night while resting at home Duration: persistent since onset Timing: constant Quality: burning Severity: Rates pain 9/10 Modifying factors: Not aggravated or relieved by anything Context:  as above  Associated symptoms:  tingling and numbness of bilateral upper extremities and feet, SOB, minimal swelling of bilateral hands and diarrhea-3x this AM, nausea with increased emotional distress. Denies blood in stool, abdominal pain, HA, back pain    PAST MEDICAL HISTORY:  Past Medical History  Diagnosis Date  . Panic attack   . Depression     PAST SURGICAL HISTORY:  Past Surgical History  Procedure Date  .  Appendectomy   . Cholecystectomy   . Abdominal hysterectomy     MEDICATIONS:  Previous Medications   ACETAMINOPHEN (TYLENOL) 325 MG TABLET    Take 325 mg by mouth every 6 (six) hours as needed. For pain    ASCORBIC ACID (VITAMIN C) 1000 MG TABLET    Take 1,000 mg by mouth 2 (two) times daily.     ASENAPINE (SAPHRIS) 5 MG SUBL    Place 5 mg under the tongue daily.     CLONAZEPAM (KLONOPIN) 1 MG TABLET    Take 1 mg by mouth 3 (three) times daily as needed. anxiety    DOLOMITE 130-78 MG TABS    Take 2 tablets by mouth 4 (four) times daily.     LAMOTRIGINE (LAMICTAL) 100 MG TABLET    Take 100 mg by mouth 2 (two) times daily.     VITAMIN B-12 (CYANOCOBALAMIN) 100 MCG TABLET    Take 50 mcg by mouth daily.       ALLERGIES:  Allergies as of 03/16/2011 - Review Complete 03/16/2011  Allergen Reaction Noted  . Morphine and related  03/16/2011     FAMILY HISTORY:  No family history on file.   SOCIAL HISTORY: History   Social History  . Marital Status: Single    Spouse Name: N/A    Number of Children: N/A  . Years of Education: N/A   Social History Main Topics  . Smoking status: Current Everyday Smoker  . Smokeless tobacco: None  . Alcohol Use: No  . Drug Use: No  . Sexually Active:    Other Topics Concern  . None   Social History Narrative  .  None     Review of Systems 10 Systems reviewed and are negative for acute change except as noted in the HPI.  Physical Exam  BP 144/84  Pulse 72  Temp(Src) 98.1 F (36.7 C) (Oral)  Resp 19  Ht 5\' 11"  (1.803 m)  Wt 175 lb (79.379 kg)  BMI 24.41 kg/m2  SpO2 97%  Physical Exam  Nursing note and vitals reviewed. Constitutional: She is oriented to person, place, and time. She appears well-developed and well-nourished.       Anxious appearing, tearful.   HENT:  Head: Normocephalic and atraumatic.  Mouth/Throat: Oropharynx is clear and moist.  Eyes: EOM are normal. Pupils are equal, round, and reactive to light.  Neck: Neck  supple.  Cardiovascular: Normal rate and regular rhythm.  Exam reveals no gallop and no friction rub.   No murmur heard. Pulmonary/Chest: Effort normal and breath sounds normal. She has no wheezes.  Abdominal: Soft. Bowel sounds are normal. She exhibits no distension. There is no tenderness.  Musculoskeletal: Normal range of motion. She exhibits no edema.       No edema of bilateral hands or lower extremities.   Lymphadenopathy:    She has no cervical adenopathy.  Neurological: She is alert and oriented to person, place, and time. No sensory deficit.  Skin: Skin is warm and dry.  Psychiatric: She has a normal mood and affect. Her behavior is normal.    ED Course  Procedures    OTHER DATA REVIEWED: Nursing notes, vital signs, and past medical records reviewed. Lab results reviewed and considered Imaging results reviewed and considered EKG evaluated and considered DIAGNOSTIC STUDIES: Oxygen Saturation is 98% on room air, normal by my interpretation.     Date: 03/16/2011  Rate: 83  Rhythm: normal sinus rhythm  QRS Axis: normal  Intervals: normal  ST/T Wave abnormalities: nonspecific T wave changes  Conduction Disutrbances:none  Narrative Interpretation:   Old EKG Reviewed: none available and unchanged    LABS / RADIOLOGY: Results for orders placed during the hospital encounter of 03/16/11  CBC      Component Value Range   WBC 9.3  4.0 - 10.5 (K/uL)   RBC 4.70  3.87 - 5.11 (MIL/uL)   Hemoglobin 14.8  12.0 - 15.0 (g/dL)   HCT 40.9  81.1 - 91.4 (%)   MCV 91.1  78.0 - 100.0 (fL)   MCH 31.5  26.0 - 34.0 (pg)   MCHC 34.6  30.0 - 36.0 (g/dL)   RDW 78.2  95.6 - 21.3 (%)   Platelets 243  150 - 400 (K/uL)  BASIC METABOLIC PANEL      Component Value Range   Sodium 137  135 - 145 (mEq/L)   Potassium 3.7  3.5 - 5.1 (mEq/L)   Chloride 104  96 - 112 (mEq/L)   CO2 20  19 - 32 (mEq/L)   Glucose, Bld 118 (*) 70 - 99 (mg/dL)   BUN 8  6 - 23 (mg/dL)   Creatinine, Ser 0.86  0.50 -  1.10 (mg/dL)   Calcium 57.8  8.4 - 10.5 (mg/dL)   GFR calc non Af Amer >60  >60 (mL/min)   GFR calc Af Amer >60  >60 (mL/min)  CARDIAC PANEL(CRET KIN+CKTOT+MB+TROPI)      Component Value Range   Total CK 38  7 - 177 (U/L)   CK, MB 1.7  0.3 - 4.0 (ng/mL)   Troponin I <0.30  <0.30 (ng/mL)   Relative Index RELATIVE INDEX IS INVALID  0.0 - 2.5   D-DIMER, QUANTITATIVE      Component Value Range   D-Dimer, Quant 0.22  0.00 - 0.48 (ug/mL-FEU)    Dg Chest 2 View  03/16/2011  *RADIOLOGY REPORT*  Clinical Data: Chest pain  CHEST - 2 VIEW  Comparison: 05/13/2010  Findings: Normal heart size, mediastinal contours, and pulmonary vascularity. Minimal chronic peribronchial thickening. Lungs otherwise clear. No pleural effusion or pneumothorax. Chronic compression deformity of a vertebra at the thoracolumbar junction. Prior cervical spine fusion.  IMPRESSION: No acute abnormalities.  Original Report Authenticated By: Lollie Marrow, M.D.     PROCEDURES:  ED COURSE / COORDINATION OF CARE: Orders Placed This Encounter  Procedures  . DG Chest 2 View  . CBC  . Basic metabolic panel  . Cardiac panel Timed (cret kin+cktot+mb+tropi)  . D-dimer, quantitative  . Vital signs   1:27PM Recheck- Pt labs and imaging  indicate no acute abnormality. Patient reports she is feeling better after administration of pain medication. Noted her anxiety was not improved with administration of Ativan.    MDM: Differential Diagnosis: PERSISTENT CP NEED TO RULE OUT CARDIAC EVENT, PE, PNEUMOTHORAX, BUT MAY BE RALTED TO ANXIETY.   IMPROVED BY DISCHARGE FEELING MUCH BETTER NOW .  PLAN:  Discharge The patient is to return the emergency department if there is any worsening of symptoms. I have reviewed the discharge instructions with the patient/family  CONDITION ON DISCHARGE: Stable, Improved  MEDICATIONS GIVEN IN THE E.D.  Medications  0.9 %  sodium chloride infusion (  Intravenous New Bag 03/16/11 0921)    lamoTRIgine (LAMICTAL) 100 MG tablet (not administered)  clonazePAM (KLONOPIN) 1 MG tablet (not administered)  asenapine (SAPHRIS) 5 MG SUBL (not administered)  acetaminophen (TYLENOL) 325 MG tablet (not administered)  vitamin B-12 (CYANOCOBALAMIN) 100 MCG tablet (not administered)  Ascorbic Acid (VITAMIN C) 1000 MG tablet (not administered)  Dolomite 130-78 MG TABS (not administered)  aspirin tablet 325 mg (325 mg Oral Given 03/16/11 0915)  ondansetron (ZOFRAN) injection 4 mg (4 mg Intravenous Given 03/16/11 0920)  LORazepam (ATIVAN) injection 2 mg (2 mg Intravenous Given 03/16/11 0919)      I personally performed the services described in this documentation, which was scribed in my presence. The recorded information has been reviewed and considered. Shelda Jakes, MD

## 2011-03-16 NOTE — ED Notes (Signed)
Pt reports was at home alone last night and started feeling a burning sensation in chest.  Says got nervous.  Reports pain got worse throughout the night.  Pt says feels like chest is caving in, pt says is very scared and nervous.  Reports hands and feet are tingling.  Pt says has had panic attacks in the past but says this doesn't seem like the other panic attacks.

## 2011-03-23 ENCOUNTER — Other Ambulatory Visit: Payer: Self-pay | Admitting: Obstetrics and Gynecology

## 2011-03-23 DIAGNOSIS — Z139 Encounter for screening, unspecified: Secondary | ICD-10-CM

## 2011-03-31 ENCOUNTER — Ambulatory Visit (HOSPITAL_COMMUNITY)
Admission: RE | Admit: 2011-03-31 | Discharge: 2011-03-31 | Disposition: A | Discharge status: Home / Self Care | Payer: Self-pay | Source: Ambulatory Visit | Attending: Obstetrics and Gynecology | Admitting: Obstetrics and Gynecology

## 2011-03-31 DIAGNOSIS — Z139 Encounter for screening, unspecified: Secondary | ICD-10-CM

## 2011-04-28 LAB — DIFFERENTIAL
Eosinophils Absolute: 0.1
Lymphs Abs: 2.8
Neutrophils Relative %: 54

## 2011-04-28 LAB — CBC
MCV: 94.2
Platelets: 167
WBC: 7.2

## 2011-04-28 LAB — BASIC METABOLIC PANEL
BUN: 9
Creatinine, Ser: 0.8
GFR calc non Af Amer: >60

## 2011-05-09 LAB — DIFFERENTIAL
Eosinophils Relative: 2
Lymphocytes Relative: 42
Lymphs Abs: 4.1 — ABNORMAL HIGH
Monocytes Absolute: 0.5
Neutro Abs: 4.8

## 2011-05-09 LAB — URINALYSIS, ROUTINE W REFLEX MICROSCOPIC
Glucose, UA: NEGATIVE
Leukocytes, UA: NEGATIVE
Nitrite: NEGATIVE
Urobilinogen, UA: 0.2

## 2011-05-09 LAB — COMPREHENSIVE METABOLIC PANEL
AST: 18
Albumin: 3.4 — ABNORMAL LOW
Calcium: 8.7
Creatinine, Ser: 0.87
GFR calc Af Amer: >60
Total Protein: 5.7 — ABNORMAL LOW

## 2011-05-09 LAB — URINE MICROSCOPIC-ADD ON

## 2011-05-09 LAB — CBC
MCHC: 33.9
MCV: 94.5
Platelets: 256
RDW: 13.9
WBC: 9.8

## 2011-05-18 LAB — COMPREHENSIVE METABOLIC PANEL
ALT: 16
Albumin: 3.5
Alkaline Phosphatase: 47
Potassium: 3.4 — ABNORMAL LOW
Sodium: 137
Total Protein: 5.7 — ABNORMAL LOW

## 2011-05-18 LAB — URINE CULTURE

## 2011-05-18 LAB — DIFFERENTIAL
Basophils Relative: 0
Lymphs Abs: 1.5
Monocytes Absolute: 0.3
Monocytes Relative: 3
Neutro Abs: 7.7

## 2011-05-18 LAB — AMYLASE: Amylase: 70

## 2011-05-18 LAB — URINALYSIS, ROUTINE W REFLEX MICROSCOPIC
Glucose, UA: NEGATIVE
Ketones, ur: 40 — AB
Specific Gravity, Urine: 1.02
pH: 7

## 2011-05-18 LAB — CBC
Platelets: 248
RDW: 14

## 2011-05-18 LAB — LIPASE, BLOOD: Lipase: 21

## 2011-07-25 ENCOUNTER — Encounter (HOSPITAL_COMMUNITY): Payer: Self-pay | Admitting: *Deleted

## 2011-07-25 ENCOUNTER — Emergency Department (HOSPITAL_COMMUNITY)
Admission: EM | Admit: 2011-07-25 | Discharge: 2011-07-25 | Disposition: A | Discharge status: Home / Self Care | Payer: Self-pay | Attending: Emergency Medicine | Admitting: Emergency Medicine

## 2011-07-25 DIAGNOSIS — A088 Other specified intestinal infections: Secondary | ICD-10-CM | POA: Insufficient documentation

## 2011-07-25 DIAGNOSIS — F410 Panic disorder [episodic paroxysmal anxiety] without agoraphobia: Secondary | ICD-10-CM | POA: Insufficient documentation

## 2011-07-25 DIAGNOSIS — F17200 Nicotine dependence, unspecified, uncomplicated: Secondary | ICD-10-CM | POA: Insufficient documentation

## 2011-07-25 DIAGNOSIS — Z9889 Other specified postprocedural states: Secondary | ICD-10-CM | POA: Insufficient documentation

## 2011-07-25 DIAGNOSIS — J069 Acute upper respiratory infection, unspecified: Secondary | ICD-10-CM | POA: Insufficient documentation

## 2011-07-25 DIAGNOSIS — A084 Viral intestinal infection, unspecified: Secondary | ICD-10-CM

## 2011-07-25 DIAGNOSIS — G43909 Migraine, unspecified, not intractable, without status migrainosus: Secondary | ICD-10-CM | POA: Insufficient documentation

## 2011-07-25 DIAGNOSIS — Z9079 Acquired absence of other genital organ(s): Secondary | ICD-10-CM | POA: Insufficient documentation

## 2011-07-25 DIAGNOSIS — F319 Bipolar disorder, unspecified: Secondary | ICD-10-CM | POA: Insufficient documentation

## 2011-07-25 HISTORY — DX: Bipolar disorder, unspecified: F31.9

## 2011-07-25 MED ORDER — IBUPROFEN 600 MG PO TABS: 600.0000 mg | ORAL_TABLET | Freq: Three times a day (TID) | ORAL | Status: AC | PRN

## 2011-07-25 MED ORDER — MAGNESIUM SULFATE 40 MG/ML IJ SOLN
2.0000 g | Freq: Once | INTRAMUSCULAR | Status: AC
  Administered 2011-07-25: 2 g via INTRAVENOUS
  Filled 2011-07-25: qty 50

## 2011-07-25 MED ORDER — SODIUM CHLORIDE 0.9 % IV BOLUS (SEPSIS)
1000.0000 mL | Freq: Once | INTRAVENOUS | Status: AC
  Administered 2011-07-25: 1000 mL via INTRAVENOUS

## 2011-07-25 MED ORDER — CETIRIZINE-PSEUDOEPHEDRINE ER 5-120 MG PO TB12: 1.0000 | ORAL_TABLET | Freq: Two times a day (BID) | ORAL | Status: DC

## 2011-07-25 MED ORDER — SODIUM CHLORIDE 0.9 % IV SOLN: INTRAVENOUS | Status: DC

## 2011-07-25 MED ORDER — KETOROLAC TROMETHAMINE 30 MG/ML IJ SOLN
30.0000 mg | Freq: Once | INTRAMUSCULAR | Status: AC
  Administered 2011-07-25: 30 mg via INTRAVENOUS
  Filled 2011-07-25: qty 1

## 2011-07-25 MED ORDER — SUMATRIPTAN SUCCINATE 6 MG/0.5ML ~~LOC~~ SOLN
6.0000 mg | Freq: Once | SUBCUTANEOUS | Status: AC
  Administered 2011-07-25: 6 mg via SUBCUTANEOUS
  Filled 2011-07-25: qty 0.5

## 2011-07-25 MED ORDER — PROMETHAZINE HCL 25 MG/ML IJ SOLN
25.0000 mg | Freq: Once | INTRAMUSCULAR | Status: AC
  Administered 2011-07-25: 25 mg via INTRAVENOUS
  Filled 2011-07-25: qty 1

## 2011-07-25 MED ORDER — ONDANSETRON HCL 8 MG PO TABS: 8.0000 mg | ORAL_TABLET | Freq: Three times a day (TID) | ORAL | Status: AC | PRN

## 2011-07-25 MED ORDER — DEXAMETHASONE SODIUM PHOSPHATE 10 MG/ML IJ SOLN
10.0000 mg | Freq: Once | INTRAMUSCULAR | Status: AC
  Administered 2011-07-25: 10 mg via INTRAVENOUS
  Filled 2011-07-25: qty 1

## 2011-07-25 MED ORDER — DIPHENHYDRAMINE HCL 50 MG/ML IJ SOLN
25.0000 mg | Freq: Once | INTRAMUSCULAR | Status: AC
  Administered 2011-07-25: 25 mg via INTRAVENOUS
  Filled 2011-07-25: qty 1

## 2011-07-25 NOTE — ED Notes (Signed)
Pt c/o migraine, n/v/,  And chills x 1 day.

## 2011-07-25 NOTE — ED Provider Notes (Signed)
History     CSN: [id number]  Arrival date & time 07/25/11  0311   First MD Initiated Contact with Patient 07/25/11 0500      Chief Complaint  Patient presents with  . Migraine  . Cough  . Nasal Congestion  . Chills  . Nausea  . Emesis    (Consider location/radiation/quality/duration/timing/severity/associated sxs/prior treatment) HPI Comments: The patient is a 45 year old female without significant past medical history who presents chiefly for evaluation of a migraine headache that she reports started gradually early this morning and has worsened to where it is now moderate to severe in intensity, throbbing, dull, bifrontal without radiation, and similar to prior migraines. She has had nausea and vomiting with it as well. This comes on it's a one-week course of an upper respiratory tract infection with nasal congestion and a nonproductive cough which is unchanged.  Patient is a 45 y.o. female presenting with migraine, cough, and vomiting. The history is provided by the patient.  Migraine This is a new (the patient reports a dull, throbbing, bifrontal headache that is similar to her prior migraine headaches. She reports is moderate to severe in intensity and its onset was gradual.) problem. The current episode started 6 to 12 hours ago. The problem occurs constantly. The problem has not changed since onset.Associated symptoms include headaches. Pertinent negatives include no chest pain, no abdominal pain and no shortness of breath. Exacerbated by: sound and light. The symptoms are relieved by nothing. Treatments tried: hydrocodone. The treatment provided no relief.  Cough This is a new problem. The current episode started more than 2 days ago (approximately one week ago). The problem occurs every few minutes. The problem has not changed since onset.The cough is non-productive. There has been no fever. Associated symptoms include headaches and rhinorrhea. Pertinent negatives include no chest  pain, no chills, no sweats, no weight loss, no ear congestion, no ear pain, no sore throat, no myalgias, no shortness of breath, no wheezing and no eye redness. She has tried nothing for the symptoms. Her past medical history does not include COPD or asthma.  Emesis  This is a new problem. The current episode started 6 to 12 hours ago. The problem occurs 2 to 4 times per day. The problem has been resolved. The emesis has an appearance of stomach contents. There has been no fever. Associated symptoms include cough, diarrhea, headaches and URI. Pertinent negatives include no abdominal pain, no arthralgias, no chills, no fever, no myalgias and no sweats.    Past Medical History  Diagnosis Date  . Panic attack   . Depression   . Endometriosis   . Bipolar 1 disorder     Past Surgical History  Procedure Date  . Appendectomy   . Cholecystectomy   . Abdominal hysterectomy     History reviewed. No pertinent family history.  History  Substance Use Topics  . Smoking status: Current Everyday Smoker  . Smokeless tobacco: Not on file  . Alcohol Use: No    OB History    Grav Para Term Preterm Abortions TAB SAB Ect Mult Living                  Review of Systems  Constitutional: Negative for fever, chills and weight loss.  HENT: Positive for rhinorrhea. Negative for ear pain and sore throat.   Eyes: Negative for redness.  Respiratory: Positive for cough. Negative for shortness of breath and wheezing.   Cardiovascular: Negative for chest pain.  Gastrointestinal: Positive for  vomiting and diarrhea. Negative for abdominal pain.  Musculoskeletal: Negative for myalgias and arthralgias.  Neurological: Positive for headaches.  All other systems reviewed and are negative.    Allergies  Morphine and related  Home Medications   Current Outpatient Rx  Name Route Sig Dispense Refill  . ACETAMINOPHEN 325 MG PO TABS Oral Take 325 mg by mouth every 6 (six) hours as needed. For pain     .  CLONAZEPAM 1 MG PO TABS Oral Take 1 mg by mouth 3 (three) times daily as needed. anxiety     . DOLOMITE 130-78 MG PO TABS Oral Take 2 tablets by mouth 4 (four) times daily.      . DULOXETINE HCL 60 MG PO CPEP Oral Take 60 mg by mouth daily.      Marland Kitchen LAMOTRIGINE 100 MG PO TABS Oral Take 100 mg by mouth 2 (two) times daily.      Marland Kitchen VITAMIN B-12 100 MCG PO TABS Oral Take 50 mcg by mouth daily.        BP 119/65  Pulse 84  Temp 97.6 F (36.4 C)  Resp 18  Ht 5\' 10"  (1.778 m)  Wt 185 lb (83.915 kg)  BMI 26.54 kg/m2  SpO2 99%  Physical Exam  Constitutional: She is oriented to person, place, and time. She appears well-developed and well-nourished. She appears distressed.  HENT:  Head: Normocephalic and atraumatic.  Right Ear: External ear normal.  Left Ear: External ear normal.  Mouth/Throat: Oropharynx is clear and moist. No oropharyngeal exudate.       Nasal congestion present  Eyes: Conjunctivae and EOM are normal. Pupils are equal, round, and reactive to light. Right eye exhibits no discharge. Left eye exhibits no discharge. Right eye exhibits no nystagmus. Left eye exhibits no nystagmus.  Fundoscopic exam:      The right eye shows no papilledema.       The left eye shows no papilledema.  Neck: Normal range of motion, full passive range of motion without pain and phonation normal. Neck supple. Carotid bruit is not present.  Cardiovascular: Normal rate, regular rhythm, normal heart sounds and intact distal pulses.  Exam reveals no gallop and no friction rub.   No murmur heard. Pulmonary/Chest: Effort normal and breath sounds normal. No respiratory distress. She has no wheezes. She has no rales.  Abdominal: Soft. Bowel sounds are normal. She exhibits no distension. There is no tenderness. There is no rebound and no guarding.  Musculoskeletal: Normal range of motion. She exhibits no edema and no tenderness.  Lymphadenopathy:    She has no cervical adenopathy.  Neurological: She is alert and  oriented to person, place, and time. She has normal reflexes. No cranial nerve deficit. She exhibits normal muscle tone. Coordination normal. GCS eye subscore is 4. GCS verbal subscore is 5. GCS motor subscore is 6.  Skin: Skin is warm and dry. No rash noted. She is not diaphoretic.  Psychiatric: She has a normal mood and affect.    ED Course  Procedures (including critical care time)  Labs Reviewed - No data to display No results found.   No diagnosis found.    MDM  No suggestion of pneumonia, pharyngitis, or otitis on examination. The patient is afebrile and appears to have a viral upper respiratory tract infection as well as a migraine headache, which she has a history of. At this time the patient's headache has been relieved after being given steroids, nonsteroidal anti-inflammatory, anti-emetic, antihistamine, IV fluid, magnesium sulfate,  and finely Imitrex subcutaneously. She also appears to have a viral gastroenteritis component, but a benign abdominal examination. I will discharge her home with decongestant antihistamine and anti-emetic.       Felisa Bonier, MD 07/25/11 [phone]

## 2012-02-14 ENCOUNTER — Emergency Department (HOSPITAL_COMMUNITY)
Admission: EM | Admit: 2012-02-14 | Discharge: 2012-02-14 | Disposition: A | Discharge status: Home / Self Care | Payer: Medicaid Other | Attending: Emergency Medicine | Admitting: Emergency Medicine

## 2012-02-14 ENCOUNTER — Emergency Department (HOSPITAL_COMMUNITY): Payer: Medicaid Other

## 2012-02-14 ENCOUNTER — Encounter (HOSPITAL_COMMUNITY): Payer: Self-pay

## 2012-02-14 DIAGNOSIS — F319 Bipolar disorder, unspecified: Secondary | ICD-10-CM | POA: Insufficient documentation

## 2012-02-14 DIAGNOSIS — R0789 Other chest pain: Secondary | ICD-10-CM

## 2012-02-14 DIAGNOSIS — R51 Headache: Secondary | ICD-10-CM

## 2012-02-14 DIAGNOSIS — F17200 Nicotine dependence, unspecified, uncomplicated: Secondary | ICD-10-CM | POA: Insufficient documentation

## 2012-02-14 DIAGNOSIS — J40 Bronchitis, not specified as acute or chronic: Secondary | ICD-10-CM

## 2012-02-14 DIAGNOSIS — Z79899 Other long term (current) drug therapy: Secondary | ICD-10-CM | POA: Insufficient documentation

## 2012-02-14 DIAGNOSIS — Z9089 Acquired absence of other organs: Secondary | ICD-10-CM | POA: Insufficient documentation

## 2012-02-14 HISTORY — DX: Headache: R51

## 2012-02-14 MED ORDER — METOCLOPRAMIDE HCL 5 MG/ML IJ SOLN
10.0000 mg | Freq: Once | INTRAMUSCULAR | Status: AC
  Administered 2012-02-14: 10 mg via INTRAVENOUS
  Filled 2012-02-14: qty 2

## 2012-02-14 MED ORDER — METHOCARBAMOL 500 MG PO TABS: 500.0000 mg | ORAL_TABLET | Freq: Two times a day (BID) | ORAL | Status: AC

## 2012-02-14 MED ORDER — VALPROATE SODIUM 500 MG/5ML IV SOLN
INTRAVENOUS | Status: AC
  Filled 2012-02-14: qty 10

## 2012-02-14 MED ORDER — DIPHENHYDRAMINE HCL 50 MG/ML IJ SOLN
25.0000 mg | Freq: Once | INTRAMUSCULAR | Status: AC
  Administered 2012-02-14: 25 mg via INTRAVENOUS
  Filled 2012-02-14: qty 1

## 2012-02-14 MED ORDER — CYCLOBENZAPRINE HCL 10 MG PO TABS
10.0000 mg | ORAL_TABLET | Freq: Once | ORAL | Status: AC
  Administered 2012-02-14: 10 mg via ORAL
  Filled 2012-02-14: qty 1

## 2012-02-14 MED ORDER — DROPERIDOL 2.5 MG/ML IJ SOLN
1.2500 mg | Freq: Once | INTRAMUSCULAR | Status: DC
  Filled 2012-02-14: qty 2

## 2012-02-14 MED ORDER — VALPROATE SODIUM 500 MG/5ML IV SOLN
1.0000 g | Freq: Once | INTRAVENOUS | Status: AC
  Administered 2012-02-14: 1000 mg via INTRAVENOUS
  Filled 2012-02-14: qty 10

## 2012-02-14 MED ORDER — KETOROLAC TROMETHAMINE 30 MG/ML IJ SOLN
30.0000 mg | Freq: Once | INTRAMUSCULAR | Status: AC
  Administered 2012-02-14: 30 mg via INTRAVENOUS
  Filled 2012-02-14: qty 1

## 2012-02-14 MED ORDER — SODIUM CHLORIDE 0.9 % IV SOLN
1000.0000 mL | Freq: Once | INTRAVENOUS | Status: AC
  Administered 2012-02-14: 1000 mL via INTRAVENOUS

## 2012-02-14 MED ORDER — TRAMADOL-ACETAMINOPHEN 37.5-325 MG PO TABS: ORAL_TABLET | ORAL | Status: AC

## 2012-02-14 MED ORDER — MAGNESIUM SULFATE 40 MG/ML IJ SOLN
2.0000 g | Freq: Once | INTRAMUSCULAR | Status: AC
  Administered 2012-02-14: 2 g via INTRAVENOUS
  Filled 2012-02-14: qty 50

## 2012-02-14 MED ORDER — SODIUM CHLORIDE 0.9 % IV SOLN
1000.0000 mL | INTRAVENOUS | Status: DC
  Administered 2012-02-14: 1000 mL via INTRAVENOUS

## 2012-02-14 MED ORDER — AMOXICILLIN 500 MG PO CAPS: 500.0000 mg | ORAL_CAPSULE | Freq: Three times a day (TID) | ORAL | Status: AC

## 2012-02-14 MED ORDER — PROMETHAZINE HCL 25 MG RE SUPP: RECTAL | Status: DC

## 2012-02-14 NOTE — ED Notes (Signed)
Pt reports no relief from pain meds.  edp notified.

## 2012-02-14 NOTE — ED Notes (Signed)
States "Flexeril is not strong enough for me, last time I had Valium, Flexeril makes me ill."  "Can you ask the doctor to give me something other than Flexeril to take at home?"  Advised patient that I would speak to the doctor in regards to her questions.

## 2012-02-14 NOTE — ED Notes (Signed)
States she feels somewhat better, states some of the pressure in her head is gone, continues to keep lights off and sunglasses on.

## 2012-02-14 NOTE — ED Notes (Signed)
Complain of rib pain on right. States she woke up this morning with rib pain and a headache

## 2012-02-14 NOTE — ED Provider Notes (Signed)
History  This chart was scribed for Ward Givens, MD by Bennett Scrape. This patient was seen in room APA17/APA17 and the patient's care was started at 3:05PM.  CSN: [id number]  Arrival date & time 02/14/12  1342   First MD Initiated Contact with Patient 02/14/12 1505      Chief Complaint  Patient presents with  . Chest Pain     Patient is a 46 y.o. female presenting with chest pain. The history is provided by the patient. No language interpreter was used.  Chest Pain The chest pain began 6 - 12 hours ago. Chest pain occurs constantly. The chest pain is unchanged. The pain does not radiate. Chest pain is worsened by deep breathing. Primary symptoms include nausea and vomiting. Pertinent negatives for primary symptoms include no fever and no abdominal pain.  Pertinent negatives for associated symptoms include no diaphoresis.    Kara Kelly is a 46 y.o. female who presents to the Emergency Department complaining of 12 hours of right pleuritic chest pain that she woke up with this morning. The pain is described sharp and is non-radiating. She reports that the pain is worse with movement and deep breathing. She lists one week of ough productive of yellow phlegm. She also c/o 3 days of frontal HA described as throbbing with associated nausea, non-bloody emesis, blurry vision and photophobia.  She states that the HA is worse with bright lights and loud noises. She reports that at baseline she has migraines once per month and HAs once or twice per week, and that tylenol improves her HA symptoms. She also reports constipation with her last bowel movement occuring 4 days ago, but attributes this to her decreased appetite due to the nausea. She denies fever, SOB, diarrhea, dizziness, lightheadedness and abdominal pain as associated symptoms. She has a h/o depression and bipolar disorder. Pt reports everyday tobacco use 1 pack/day.     PCP [facility] Department.   Past Medical History    Diagnosis Date  . Panic attack   . Depression   . Endometriosis   . Bipolar 1 disorder   . Headache     Past Surgical History  Procedure Date  . Appendectomy   . Cholecystectomy   . Abdominal hysterectomy     No family history on file.  History  Substance Use Topics  . Smoking status: Current Everyday Smoker  . Smokeless tobacco: Not on file  . Alcohol Use: No  unemployed  No OB history provided.  Review of Systems  Constitutional: Negative for fever and diaphoresis.  Eyes: Positive for photophobia and visual disturbance.  Cardiovascular: Positive for chest pain. Negative for leg swelling.  Gastrointestinal: Positive for nausea, vomiting and constipation. Negative for abdominal pain and diarrhea.  Neurological: Positive for headaches. Negative for syncope.    Allergies  Morphine and related  Home Medications   Current Outpatient Rx  Name Route Sig Dispense Refill  . ACETAMINOPHEN 325 MG PO TABS Oral Take 325 mg by mouth every 6 (six) hours as needed. For pain     . CLONAZEPAM 1 MG PO TABS Oral Take 1 mg by mouth 3 (three) times daily as needed. anxiety     . DULOXETINE HCL 60 MG PO CPEP Oral Take 60 mg by mouth daily.      Marland Kitchen LAMOTRIGINE 100 MG PO TABS Oral Take 200 mg by mouth at bedtime.     Marland Kitchen VITAMIN B-12 100 MCG PO TABS Oral Take 50 mcg by mouth daily.  BP 116/72  Pulse 91  Temp 97.6 F (36.4 C) (Oral)  Resp 20  Ht 5\' 8"  (1.727 m)  Wt 211 lb (95.709 kg)  BMI 32.08 kg/m2  SpO2 99%  Vital signs normal    Physical Exam  Nursing note and vitals reviewed. Constitutional: She is oriented to person, place, and time. She appears well-developed. No distress.       Pt wearing sunglasses  HENT:  Head: Normocephalic and atraumatic.  Right Ear: External ear normal.  Left Ear: External ear normal.  Nose: Nose normal.  Mouth/Throat: Oropharynx is clear and moist.  Eyes: Conjunctivae and EOM are normal. Pupils are equal, round, and reactive to light.   Neck: Normal range of motion. Neck supple. No tracheal deviation present.  Cardiovascular: Normal rate and normal heart sounds.   Pulmonary/Chest: Effort normal and breath sounds normal. No respiratory distress. She has no wheezes. She has no rales.  Abdominal: Soft. Bowel sounds are normal. There is tenderness (Right anterior ribcage, no crepitus).    Musculoskeletal: Normal range of motion. She exhibits no edema.  Neurological: She is alert and oriented to person, place, and time. No cranial nerve deficit.  Skin: Skin is warm and dry.  Psychiatric: She has a normal mood and affect. Her behavior is normal.    ED Course  Procedures (including critical care time)   Medications  0.9 %  sodium chloride infusion (1000 mL Intravenous New Bag/Given 02/14/12 1614)    Followed by  0.9 %  sodium chloride infusion (1000 mL Intravenous New Bag/Given 02/14/12 1614)    Followed by  0.9 %  sodium chloride infusion (not administered)  metoCLOPramide (REGLAN) injection 10 mg (10 mg Intravenous Given 02/14/12 1615)  diphenhydrAMINE (BENADRYL) injection 25 mg (25 mg Intravenous Given 02/14/12 1615)  cyclobenzaprine (FLEXERIL) tablet 10 mg (10 mg Oral Given 02/14/12 1615)    DIAGNOSTIC STUDIES: Oxygen Saturation is 99% on room air, normal by my interpretation.    COORDINATION OF CARE: 3:45PM- Discussed treatment plan which includes a CXR with pt at bedside and pt agreed to plan. 5:58PM- Informed pt of negative chest XR and that pain is most likely associated with muscle tearing due to dry heaving. 9:50PM- Pt reports improvements to HA symptoms.  Discussed discharge plans, pt agreed.  No recent labwork.  Dg Ribs Unilateral W/chest Right  02/14/2012  *RADIOLOGY REPORT*  Clinical Data: Right anterior chest pain  RIGHT RIBS AND CHEST - 3+ VIEW  Comparison: 11/24/2011  Findings: No acute displaced or deforming rib fracture is noted. The lungs are well-aerated.  No pneumothorax is seen.  Postsurgical  changes in the cervical spine are noted.  IMPRESSION: No evidence of acute rib fracture.  Original Report Authenticated By: Phillips Odor, M.D.     Date: 02/14/2012  Rate: 70  Rhythm: normal sinus rhythm and premature ventricular contractions (PVC)  QRS Axis: normal  Intervals: QT prolonged  ST/T Wave abnormalities: normal  Conduction Disutrbances:none  Narrative Interpretation:   Old EKG Reviewed: unchanged from 03/16/2011 NOTE Pt unable to received inapsine b/o prolonged QTI     1. Chest wall pain   2. Bronchitis   3. Headache     New Prescriptions   AMOXICILLIN (AMOXIL) 500 MG CAPSULE    Take 1 capsule (500 mg total) by mouth 3 (three) times daily.   METHOCARBAMOL (ROBAXIN) 500 MG TABLET    Take 1 tablet (500 mg total) by mouth 2 (two) times daily.   PROMETHAZINE (PHENERGAN) 25 MG SUPPOSITORY  Unwrap and insert 1 PR PRN nausea, vomiting or headache   TRAMADOL-ACETAMINOPHEN (ULTRACET) 37.5-325 MG PER TABLET    2 tabs po QID prn pain    Plan discharge  Devoria Albe, MD, FACEP   MDM   I personally performed the services described in this documentation, which was scribed in my presence. The recorded information has been reviewed and considered.  Devoria Albe, MD, [facility]        Ward Givens, MD 02/14/12 2203

## 2012-02-22 ENCOUNTER — Emergency Department (HOSPITAL_COMMUNITY)
Admission: EM | Admit: 2012-02-22 | Discharge: 2012-02-22 | Discharge status: Against Medical Advice | Payer: Medicaid Other | Attending: Emergency Medicine | Admitting: Emergency Medicine

## 2012-04-16 ENCOUNTER — Other Ambulatory Visit (HOSPITAL_COMMUNITY): Payer: Self-pay | Admitting: Nurse Practitioner

## 2012-04-16 DIAGNOSIS — Z139 Encounter for screening, unspecified: Secondary | ICD-10-CM

## 2012-04-23 ENCOUNTER — Ambulatory Visit (HOSPITAL_COMMUNITY): Payer: Medicaid Other

## 2012-06-24 ENCOUNTER — Encounter (HOSPITAL_COMMUNITY): Payer: Self-pay | Admitting: *Deleted

## 2012-06-24 ENCOUNTER — Emergency Department (HOSPITAL_COMMUNITY)
Admission: EM | Admit: 2012-06-24 | Discharge: 2012-06-24 | Disposition: A | Discharge status: Home / Self Care | Payer: Self-pay | Attending: Emergency Medicine | Admitting: Emergency Medicine

## 2012-06-24 DIAGNOSIS — Z79899 Other long term (current) drug therapy: Secondary | ICD-10-CM | POA: Insufficient documentation

## 2012-06-24 DIAGNOSIS — F410 Panic disorder [episodic paroxysmal anxiety] without agoraphobia: Secondary | ICD-10-CM | POA: Insufficient documentation

## 2012-06-24 DIAGNOSIS — F3289 Other specified depressive episodes: Secondary | ICD-10-CM | POA: Insufficient documentation

## 2012-06-24 DIAGNOSIS — R51 Headache: Secondary | ICD-10-CM | POA: Insufficient documentation

## 2012-06-24 DIAGNOSIS — F319 Bipolar disorder, unspecified: Secondary | ICD-10-CM | POA: Insufficient documentation

## 2012-06-24 DIAGNOSIS — F17200 Nicotine dependence, unspecified, uncomplicated: Secondary | ICD-10-CM | POA: Insufficient documentation

## 2012-06-24 DIAGNOSIS — Z8742 Personal history of other diseases of the female genital tract: Secondary | ICD-10-CM | POA: Insufficient documentation

## 2012-06-24 DIAGNOSIS — F329 Major depressive disorder, single episode, unspecified: Secondary | ICD-10-CM | POA: Insufficient documentation

## 2012-06-24 DIAGNOSIS — Z859 Personal history of malignant neoplasm, unspecified: Secondary | ICD-10-CM | POA: Insufficient documentation

## 2012-06-24 HISTORY — DX: Personality disorder, unspecified: F60.9

## 2012-06-24 MED ORDER — HYDROMORPHONE HCL PF 1 MG/ML IJ SOLN
1.0000 mg | Freq: Once | INTRAMUSCULAR | Status: AC
  Administered 2012-06-24: 1 mg via INTRAVENOUS
  Filled 2012-06-24: qty 1

## 2012-06-24 MED ORDER — AMOXICILLIN 500 MG PO CAPS: 500.0000 mg | ORAL_CAPSULE | Freq: Three times a day (TID) | ORAL | Status: DC

## 2012-06-24 MED ORDER — ONDANSETRON HCL 4 MG/2ML IJ SOLN
4.0000 mg | Freq: Once | INTRAMUSCULAR | Status: AC
  Administered 2012-06-24: 4 mg via INTRAVENOUS
  Filled 2012-06-24: qty 2

## 2012-06-24 MED ORDER — METOCLOPRAMIDE HCL 5 MG/ML IJ SOLN
10.0000 mg | Freq: Once | INTRAMUSCULAR | Status: AC
  Administered 2012-06-24: 10 mg via INTRAVENOUS
  Filled 2012-06-24: qty 2

## 2012-06-24 MED ORDER — OXYCODONE-ACETAMINOPHEN 5-325 MG PO TABS
1.0000 | ORAL_TABLET | Freq: Four times a day (QID) | ORAL | Status: DC | PRN
Start: 2012-06-24 — End: 2012-09-30

## 2012-06-24 MED ORDER — SODIUM CHLORIDE 0.9 % IV BOLUS (SEPSIS)
1000.0000 mL | Freq: Once | INTRAVENOUS | Status: AC
  Administered 2012-06-24: 1000 mL via INTRAVENOUS

## 2012-06-24 MED ORDER — KETOROLAC TROMETHAMINE 30 MG/ML IJ SOLN
30.0000 mg | Freq: Once | INTRAMUSCULAR | Status: AC
  Administered 2012-06-24: 30 mg via INTRAVENOUS
  Filled 2012-06-24: qty 1

## 2012-06-24 MED ORDER — DIPHENHYDRAMINE HCL 50 MG/ML IJ SOLN
25.0000 mg | Freq: Once | INTRAMUSCULAR | Status: AC
  Administered 2012-06-24: 25 mg via INTRAVENOUS
  Filled 2012-06-24: qty 1

## 2012-06-24 NOTE — ED Notes (Signed)
Pt requesting pain medication and antibiotic. Dr. Adriana Simas aware. 2 scripts given to pt

## 2012-06-24 NOTE — ED Provider Notes (Signed)
History  This chart was scribed for Donnetta Hutching, MD by Erskine Emery, ED Scribe. This patient was seen in room APA16A/APA16A and the patient's care was started at 15:10.   CSN: [id number]  Arrival date & time 06/24/12  1240   First MD Initiated Contact with Patient 06/24/12 1510      Chief Complaint  Patient presents with  . Migraine    (Consider location/radiation/quality/duration/timing/severity/associated sxs/prior treatment) The history is provided by the patient. No language interpreter was used.  Kara Kelly is a 46 y.o. female who presents to the Emergency Department complaining of a  biparietal and bioccipital migraine and bilateral jaw pain for the past 2 days. Pt reports some associated emesis, nausea, and throat swelling. Pt denies any associated throat soreness but reports she can't take her medication he throat is so swollen. Pt took 8 mg of ibuprofen and some Tylenol PM with no relief of symptoms.   Past Medical History  Diagnosis Date  . Panic attack   . Depression   . Endometriosis   . Bipolar 1 disorder   . Headache   . Cancer   . Personality disorder     Past Surgical History  Procedure Date  . Appendectomy   . Cholecystectomy   . Abdominal hysterectomy   . Neck surgery     No family history on file.  History  Substance Use Topics  . Smoking status: Current Every Day Smoker    Types: Cigarettes  . Smokeless tobacco: Not on file  . Alcohol Use: No    OB History    Grav Para Term Preterm Abortions TAB SAB Ect Mult Living                  Review of Systems A complete 10 system review of systems was obtained and all systems are negative except as noted in the HPI and PMH.    Allergies  Morphine and related  Home Medications   Current Outpatient Rx  Name  Route  Sig  Dispense  Refill  . ALPRAZOLAM 1 MG PO TABS   Oral   Take 1 mg by mouth 3 (three) times daily.         . DULOXETINE HCL 60 MG PO CPEP   Oral   Take 60 mg by mouth.            Marland Kitchen GABAPENTIN 100 MG PO CAPS   Oral   Take 100 mg by mouth 2 (two) times daily.         Marland Kitchen LAMOTRIGINE 100 MG PO TABS   Oral   Take 100 mg by mouth 2 (two) times daily.          Marland Kitchen OXCARBAZEPINE 150 MG PO TABS   Oral   Take 150 mg by mouth daily.         Marland Kitchen PROMETHAZINE HCL 25 MG RE SUPP      Unwrap and insert 1 PR PRN nausea, vomiting or headache   8 each   0   . VITAMIN B-12 100 MCG PO TABS   Oral   Take 50 mcg by mouth daily.            Triage vitals: BP 95/71  Pulse 84  Temp 98.1 F (36.7 C) (Oral)  Resp 16  Ht 5\' 8"  (1.727 m)  Wt 205 lb (92.987 kg)  BMI 31.17 kg/m2  SpO2 100%  Physical Exam  Nursing note and vitals reviewed. Constitutional: She is oriented to  person, place, and time. She appears well-developed and well-nourished.  HENT:  Head: Normocephalic and atraumatic.       Tender inferior to the angle of the mandible bilaterally. Throat is slightly erythematous.   Eyes: Conjunctivae normal and EOM are normal. Pupils are equal, round, and reactive to light.  Neck: Normal range of motion. Neck supple.       Shoddy adenopathy.  Cardiovascular: Normal rate, regular rhythm and normal heart sounds.   Pulmonary/Chest: Effort normal and breath sounds normal.  Abdominal: Soft. Bowel sounds are normal.  Musculoskeletal: Normal range of motion.  Neurological: She is alert and oriented to person, place, and time.  Skin: Skin is warm and dry.  Psychiatric: She has a normal mood and affect.    ED Course  Procedures (including critical care time) DIAGNOSTIC STUDIES: Oxygen Saturation is 100% on room air, normal by my interpretation.    COORDINATION OF CARE: 15:30--I evaluated the pt and we discussed a treatment plan including IV fluids, pain medication (Toradol, Benadryl, Reglan), and strep test to which the pt agreed.    Labs Reviewed  RAPID STREP SCREEN   No results found.   No diagnosis found.    MDM  Patient has shotty  submandibular adenopathy.   Rx amoxicillin 500 mg 3 times a day #30 and Percocet #10. No evidence of meningitis.      I personally performed the services described in this documentation, which was scribed in my presence. The recorded information has been reviewed and is accurate.    Donnetta Hutching, MD 06/24/12 [phone]

## 2012-06-24 NOTE — ED Notes (Signed)
Migraine headache x 2 days.  bil jaw pain and cough also with sore throat.  Reports vomiting with headache.

## 2012-08-29 ENCOUNTER — Encounter (HOSPITAL_COMMUNITY): Payer: Self-pay | Admitting: Emergency Medicine

## 2012-08-29 ENCOUNTER — Emergency Department (HOSPITAL_COMMUNITY)
Admission: EM | Admit: 2012-08-29 | Discharge: 2012-08-29 | Disposition: A | Discharge status: Other Acute Inpatient Hospital | Payer: Self-pay | Attending: Emergency Medicine | Admitting: Emergency Medicine

## 2012-08-29 DIAGNOSIS — Z859 Personal history of malignant neoplasm, unspecified: Secondary | ICD-10-CM | POA: Insufficient documentation

## 2012-08-29 DIAGNOSIS — Z8742 Personal history of other diseases of the female genital tract: Secondary | ICD-10-CM | POA: Insufficient documentation

## 2012-08-29 DIAGNOSIS — Z8679 Personal history of other diseases of the circulatory system: Secondary | ICD-10-CM | POA: Insufficient documentation

## 2012-08-29 DIAGNOSIS — F319 Bipolar disorder, unspecified: Secondary | ICD-10-CM | POA: Insufficient documentation

## 2012-08-29 DIAGNOSIS — F4310 Post-traumatic stress disorder, unspecified: Secondary | ICD-10-CM | POA: Insufficient documentation

## 2012-08-29 DIAGNOSIS — F17200 Nicotine dependence, unspecified, uncomplicated: Secondary | ICD-10-CM | POA: Insufficient documentation

## 2012-08-29 DIAGNOSIS — Z79899 Other long term (current) drug therapy: Secondary | ICD-10-CM | POA: Insufficient documentation

## 2012-08-29 DIAGNOSIS — F309 Manic episode, unspecified: Secondary | ICD-10-CM | POA: Insufficient documentation

## 2012-08-29 DIAGNOSIS — R4689 Other symptoms and signs involving appearance and behavior: Secondary | ICD-10-CM

## 2012-08-29 DIAGNOSIS — F603 Borderline personality disorder: Secondary | ICD-10-CM | POA: Insufficient documentation

## 2012-08-29 DIAGNOSIS — Z8659 Personal history of other mental and behavioral disorders: Secondary | ICD-10-CM | POA: Insufficient documentation

## 2012-08-29 LAB — CBC WITH DIFFERENTIAL/PLATELET
Basophils Absolute: 0.1 10*3/uL (ref 0.0–0.1)
Eosinophils Absolute: 0.2 10*3/uL (ref 0.0–0.7)
Eosinophils Relative: 2 % (ref 0–5)
HCT: 43.9 % (ref 36.0–46.0)
Lymphocytes Relative: 37 % (ref 12–46)
MCH: 32.8 pg (ref 26.0–34.0)
MCHC: 34.6 g/dL (ref 30.0–36.0)
MCV: 94.8 fL (ref 78.0–100.0)
Monocytes Absolute: 0.9 10*3/uL (ref 0.1–1.0)
Platelets: 260 10*3/uL (ref 150–400)
RDW: 14.3 % (ref 11.5–15.5)
WBC: 10.3 10*3/uL (ref 4.0–10.5)

## 2012-08-29 LAB — RAPID URINE DRUG SCREEN, HOSP PERFORMED
Cocaine: NOT DETECTED
Opiates: NOT DETECTED

## 2012-08-29 LAB — COMPREHENSIVE METABOLIC PANEL
AST: 34 U/L (ref 0–37)
CO2: 28 mEq/L (ref 19–32)
Calcium: 9.6 mg/dL (ref 8.4–10.5)
Creatinine, Ser: 0.83 mg/dL (ref 0.50–1.10)
GFR calc Af Amer: >90 mL/min (ref >90)
GFR calc non Af Amer: 84 mL/min — ABNORMAL LOW (ref >90)
Glucose, Bld: 90 mg/dL (ref 70–99)
Sodium: 140 mEq/L (ref 135–145)
Total Protein: 7.2 g/dL (ref 6.0–8.3)

## 2012-08-29 LAB — URINALYSIS, ROUTINE W REFLEX MICROSCOPIC
Leukocytes, UA: NEGATIVE
Protein, ur: NEGATIVE mg/dL
Specific Gravity, Urine: >1.03 — ABNORMAL HIGH (ref 1.005–1.030)
Urobilinogen, UA: 0.2 mg/dL (ref 0.0–1.0)

## 2012-08-29 MED ORDER — LORAZEPAM 2 MG/ML IJ SOLN: 2.0000 mg | Freq: Three times a day (TID) | INTRAMUSCULAR | Status: DC | PRN

## 2012-08-29 MED ORDER — ONDANSETRON HCL 4 MG PO TABS: 4.0000 mg | ORAL_TABLET | Freq: Three times a day (TID) | ORAL | Status: DC | PRN

## 2012-08-29 MED ORDER — ACETAMINOPHEN 325 MG PO TABS: 650.0000 mg | ORAL_TABLET | ORAL | Status: DC | PRN

## 2012-08-29 MED ORDER — ZOLPIDEM TARTRATE 5 MG PO TABS: 10.0000 mg | ORAL_TABLET | Freq: Every evening | ORAL | Status: DC | PRN

## 2012-08-29 MED ORDER — LAMOTRIGINE 100 MG PO TABS
100.0000 mg | ORAL_TABLET | Freq: Two times a day (BID) | ORAL | Status: DC
  Filled 2012-08-29 (×4): qty 1

## 2012-08-29 MED ORDER — OXCARBAZEPINE 150 MG PO TABS
150.0000 mg | ORAL_TABLET | Freq: Every day | ORAL | Status: DC
  Filled 2012-08-29 (×3): qty 1

## 2012-08-29 MED ORDER — ZIPRASIDONE MESYLATE 20 MG IM SOLR: 10.0000 mg | Freq: Three times a day (TID) | INTRAMUSCULAR | Status: DC | PRN

## 2012-08-29 MED ORDER — IBUPROFEN 400 MG PO TABS: 600.0000 mg | ORAL_TABLET | Freq: Three times a day (TID) | ORAL | Status: DC | PRN

## 2012-08-29 MED ORDER — DIAZEPAM 5 MG PO TABS
5.0000 mg | ORAL_TABLET | Freq: Three times a day (TID) | ORAL | Status: DC
  Administered 2012-08-29: 5 mg via ORAL
  Filled 2012-08-29: qty 1

## 2012-08-29 MED ORDER — LORAZEPAM 1 MG PO TABS
2.0000 mg | ORAL_TABLET | Freq: Once | ORAL | Status: AC
  Administered 2012-08-29: 2 mg via ORAL
  Filled 2012-08-29: qty 2

## 2012-08-29 MED ORDER — DULOXETINE HCL 60 MG PO CPEP
60.0000 mg | ORAL_CAPSULE | Freq: Every day | ORAL | Status: DC
  Filled 2012-08-29 (×3): qty 1

## 2012-08-29 MED ORDER — NICOTINE 21 MG/24HR TD PT24: 21.0000 mg | MEDICATED_PATCH | Freq: Every day | TRANSDERMAL | Status: DC

## 2012-08-29 MED ORDER — ACETAMINOPHEN 500 MG PO TABS
1000.0000 mg | ORAL_TABLET | Freq: Once | ORAL | Status: AC
  Administered 2012-08-29: 1000 mg via ORAL
  Filled 2012-08-29: qty 2

## 2012-08-29 MED ORDER — ZIPRASIDONE HCL 40 MG PO CAPS
ORAL_CAPSULE | ORAL | Status: AC
  Filled 2012-08-29: qty 1

## 2012-08-29 MED ORDER — ALUM & MAG HYDROXIDE-SIMETH 200-200-20 MG/5ML PO SUSP: 30.0000 mL | ORAL | Status: DC | PRN

## 2012-08-29 MED ORDER — ZIPRASIDONE HCL 40 MG PO CAPS
40.0000 mg | ORAL_CAPSULE | Freq: Two times a day (BID) | ORAL | Status: DC
  Administered 2012-08-29: 40 mg via ORAL
  Filled 2012-08-29 (×4): qty 1

## 2012-08-29 MED ORDER — GABAPENTIN 100 MG PO CAPS
100.0000 mg | ORAL_CAPSULE | Freq: Two times a day (BID) | ORAL | Status: DC
  Filled 2012-08-29 (×4): qty 1

## 2012-08-29 MED ORDER — LORAZEPAM 1 MG PO TABS
ORAL_TABLET | ORAL | Status: AC
  Filled 2012-08-29: qty 1

## 2012-08-29 NOTE — ED Notes (Signed)
Lab at bedside to draw blood. Patient more cooperative at this time.   Security came back with patient's belongings due to patient stating she now wanted the ED to lock up her stuff because "I don't trust him with my stuff". Belongings mentioned in previous noted and a purse locked up in ED locker by security.

## 2012-08-29 NOTE — ED Notes (Signed)
Patient has Ecologist at bedside.

## 2012-08-29 NOTE — ED Notes (Signed)
Najah at [facility] called to let us know that Pt did not meet their Criteria.  Nurse informed.
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
## 2012-08-29 NOTE — ED Notes (Signed)
Patient's father at bedside. He was wanded by security before visiting patient.
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
## 2012-08-29 NOTE — ED Notes (Signed)
Patient wanting husband to come back and visit. Patient informed that the ED has a one visitor policy, and that her dad would have to leave and switch out with husband. Patient did not want father to leave.

## 2012-08-29 NOTE — ED Notes (Signed)
Pt c/o hi/si x 2 weeks. Just out of [facility] and was better. States a doctor took her off of cogentin and has been feeling like this since. Pt restless. Threatening people. Anxiety. Shaking legs. States "nobody better look at me wrong I will fuck them up". Denies plan to hurt self but states "no I wanna fuck everybody else up that looks at me wrong". Friend at side calm and helpful.

## 2012-08-29 NOTE — BH Assessment (Signed)
[facility] Assessment Progress Note      Consulted with [facility] who declined pt, as pt does not meet inpatient criteria. Pt has been referred to [facility] for placement. Notified [employer] staff of this.

## 2012-08-29 NOTE — ED Notes (Signed)
Patient changed into blue scrubs and wanded by security. Patient refusing to let her things out of the room. Husband eventually got patient to agree to let him lock her things in their vehicle. Shirt, pant, one pair of house shoes, and two coats given to husband and he left ED with them in his possession. Patient excessively loud, cursing, threatening to "fuck up anyone that looks at me wrong". Is currently talking with Tommy from ACT team.

## 2012-08-29 NOTE — ED Notes (Signed)
Patient complaining of headache and stated "the medicine they gave me earlier to help calm me down did not help at all." Advised MD.

## 2012-09-30 ENCOUNTER — Emergency Department (HOSPITAL_COMMUNITY)
Admission: EM | Admit: 2012-09-30 | Discharge: 2012-09-30 | Disposition: A | Discharge status: Home / Self Care | Payer: Self-pay | Attending: Emergency Medicine | Admitting: Emergency Medicine

## 2012-09-30 ENCOUNTER — Encounter (HOSPITAL_COMMUNITY): Payer: Self-pay

## 2012-09-30 DIAGNOSIS — R21 Rash and other nonspecific skin eruption: Secondary | ICD-10-CM | POA: Insufficient documentation

## 2012-09-30 DIAGNOSIS — F410 Panic disorder [episodic paroxysmal anxiety] without agoraphobia: Secondary | ICD-10-CM | POA: Insufficient documentation

## 2012-09-30 DIAGNOSIS — L089 Local infection of the skin and subcutaneous tissue, unspecified: Secondary | ICD-10-CM | POA: Insufficient documentation

## 2012-09-30 DIAGNOSIS — F319 Bipolar disorder, unspecified: Secondary | ICD-10-CM | POA: Insufficient documentation

## 2012-09-30 DIAGNOSIS — Z859 Personal history of malignant neoplasm, unspecified: Secondary | ICD-10-CM | POA: Insufficient documentation

## 2012-09-30 DIAGNOSIS — Z8659 Personal history of other mental and behavioral disorders: Secondary | ICD-10-CM | POA: Insufficient documentation

## 2012-09-30 DIAGNOSIS — Z8742 Personal history of other diseases of the female genital tract: Secondary | ICD-10-CM | POA: Insufficient documentation

## 2012-09-30 DIAGNOSIS — F17200 Nicotine dependence, unspecified, uncomplicated: Secondary | ICD-10-CM | POA: Insufficient documentation

## 2012-09-30 DIAGNOSIS — Z79899 Other long term (current) drug therapy: Secondary | ICD-10-CM | POA: Insufficient documentation

## 2012-09-30 DIAGNOSIS — F1210 Cannabis abuse, uncomplicated: Secondary | ICD-10-CM | POA: Insufficient documentation

## 2012-09-30 MED ORDER — HYDROCODONE-ACETAMINOPHEN 5-325 MG PO TABS
1.0000 | ORAL_TABLET | Freq: Once | ORAL | Status: AC
  Administered 2012-09-30: 1 via ORAL
  Filled 2012-09-30: qty 1

## 2012-09-30 MED ORDER — SULFAMETHOXAZOLE-TMP DS 800-160 MG PO TABS
1.0000 | ORAL_TABLET | Freq: Once | ORAL | Status: AC
  Administered 2012-09-30: 1 via ORAL
  Filled 2012-09-30: qty 1

## 2012-09-30 MED ORDER — SULFAMETHOXAZOLE-TRIMETHOPRIM 800-160 MG PO TABS: 1.0000 | ORAL_TABLET | Freq: Two times a day (BID) | ORAL | Status: DC

## 2012-09-30 MED ORDER — KETOROLAC TROMETHAMINE 60 MG/2ML IM SOLN
60.0000 mg | Freq: Once | INTRAMUSCULAR | Status: AC
  Administered 2012-09-30: 60 mg via INTRAMUSCULAR
  Filled 2012-09-30: qty 2

## 2012-09-30 MED ORDER — HYDROCODONE-ACETAMINOPHEN 5-325 MG PO TABS: 1.0000 | ORAL_TABLET | ORAL | Status: DC | PRN

## 2012-09-30 NOTE — ED Notes (Signed)
When in to discharge pt ans go over RX pt stated RX was not strong enough for her. She has a high pain tolerance

## 2012-09-30 NOTE — ED Notes (Signed)
Pt c/o foot pain for about a week and a half. Pt presents with some redness and mild swelling on top of right foot. Small red bumps also present on foot. Recent tattoo in area within the last week and a half.

## 2012-10-02 NOTE — ED Provider Notes (Signed)
History     CSN: [id number]  Arrival date & time 09/30/12  4098   First MD Initiated Contact with Patient 09/30/12 1040      Chief Complaint  Patient presents with  . Foot Pain    (Consider location/radiation/quality/duration/timing/severity/associated sxs/prior treatment) HPI Comments: Kara Kelly is a 47 y.o. Female presenting with pain, redness and drainage from her right dorsal foot at the site of a tattoo she had placed about 10 days ago.  Surrounding the tattoo itself are also small raised "bumps" which are tender but have not drained.  She denies fevers and chills and has had no red streaking, but does have pain that radiates into her ankle and lower leg which is worsened with attempts to weight bear.  She has taken no medicines prior to arrival, but has tried an otc antibiotic ointment without relief of symptoms. Her tattoo was placed by a professional shop, she reports new sterile needles were used.     The history is provided by the patient and the spouse.    Past Medical History  Diagnosis Date  . Panic attack   . Depression   . Endometriosis   . Bipolar 1 disorder   . Headache   . Cancer   . Personality disorder     Past Surgical History  Procedure Laterality Date  . Appendectomy    . Cholecystectomy    . Abdominal hysterectomy    . Neck surgery      No family history on file.  History  Substance Use Topics  . Smoking status: Current Every Day Smoker -- 1.00 packs/day    Types: Cigarettes  . Smokeless tobacco: Not on file  . Alcohol Use: No    OB History   Grav Para Term Preterm Abortions TAB SAB Ect Mult Living                  Review of Systems  Constitutional: Negative for fever and chills.  HENT: Negative for facial swelling.   Respiratory: Negative for shortness of breath and wheezing.   Skin: Positive for rash and wound.  Neurological: Negative for numbness.    Allergies  Morphine and related  Home Medications   Current  Outpatient Rx  Name  Route  Sig  Dispense  Refill  . diazepam (VALIUM) 10 MG tablet   Oral   Take 10 mg by mouth 3 (three) times daily.         Marland Kitchen gabapentin (NEURONTIN) 400 MG capsule   Oral   Take 400 mg by mouth 3 (three) times daily.         Marland Kitchen lamoTRIgine (LAMICTAL) 100 MG tablet   Oral   Take 100 mg by mouth 2 (two) times daily.          . Multiple Vitamins-Minerals (MULTIVITAMINS THER. W/MINERALS) TABS   Oral   Take 1 tablet by mouth daily.         . OXcarbazepine (TRILEPTAL) 150 MG tablet   Oral   Take 150 mg by mouth daily.         Marland Kitchen HYDROcodone-acetaminophen (NORCO/VICODIN) 5-325 MG per tablet   Oral   Take 1 tablet by mouth every 4 (four) hours as needed for pain.   20 tablet   0   . sulfamethoxazole-trimethoprim (SEPTRA DS) 800-160 MG per tablet   Oral   Take 1 tablet by mouth every 12 (twelve) hours.   20 tablet   0     BP 115/62  Pulse 107  Temp(Src) 98.4 F (36.9 C) (Oral)  Resp 18  Ht 5\' 8"  (1.727 m)  Wt 200 lb (90.719 kg)  BMI 30.42 kg/m2  SpO2 95%  Physical Exam  Constitutional: She appears well-developed and well-nourished. No distress.  HENT:  Head: Normocephalic.  Neck: Neck supple.  Cardiovascular: Normal rate.   Pulmonary/Chest: Effort normal. She has no wheezes.  Musculoskeletal: Normal range of motion. She exhibits no edema.  Skin:  Raised,  Scaling peeling skin noted over the "vine" of the her tattoo which crosses her right foot dorsum.  She has moderate surrounding erythema without edema, scattered small papules, no pustules present.  The rash appears dry at this time.  Very ttp,  No red streaking.  Less than 3 sec cap refill.  Pedal pulses normal.    ED Course  Procedures (including critical care time)  Labs Reviewed - No data to display No results found.   1. Skin infection       MDM  Suspect localized skin infection surrounding tattoo.  Pt placed on bactrim,  Encouraged warm soaks, elevation.  Hydrocodone  prescribed.  Given dose of each along with IM toradol for immediate pain relief.  Crutches supplied.  Recheck by pcp or return here prn worsening sx.        Burgess Amor, PA 10/02/12 1214

## 2012-10-02 NOTE — ED Provider Notes (Signed)
Medical screening examination/treatment/procedure(s) were performed by non-physician practitioner and as supervising physician I was immediately available for consultation/collaboration.   Joseph L Zammit, MD 10/02/12 1436 

## 2012-10-17 ENCOUNTER — Emergency Department (HOSPITAL_COMMUNITY): Payer: Self-pay

## 2012-10-17 ENCOUNTER — Emergency Department (HOSPITAL_COMMUNITY)
Admission: EM | Admit: 2012-10-17 | Discharge: 2012-10-17 | Disposition: A | Discharge status: Home / Self Care | Payer: Self-pay | Attending: Emergency Medicine | Admitting: Emergency Medicine

## 2012-10-17 DIAGNOSIS — M7730 Calcaneal spur, unspecified foot: Secondary | ICD-10-CM | POA: Insufficient documentation

## 2012-10-17 DIAGNOSIS — F410 Panic disorder [episodic paroxysmal anxiety] without agoraphobia: Secondary | ICD-10-CM | POA: Insufficient documentation

## 2012-10-17 DIAGNOSIS — F319 Bipolar disorder, unspecified: Secondary | ICD-10-CM | POA: Insufficient documentation

## 2012-10-17 DIAGNOSIS — M19079 Primary osteoarthritis, unspecified ankle and foot: Secondary | ICD-10-CM | POA: Insufficient documentation

## 2012-10-17 DIAGNOSIS — F17200 Nicotine dependence, unspecified, uncomplicated: Secondary | ICD-10-CM | POA: Insufficient documentation

## 2012-10-17 DIAGNOSIS — Z79899 Other long term (current) drug therapy: Secondary | ICD-10-CM | POA: Insufficient documentation

## 2012-10-17 DIAGNOSIS — Z8589 Personal history of malignant neoplasm of other organs and systems: Secondary | ICD-10-CM | POA: Insufficient documentation

## 2012-10-17 DIAGNOSIS — Z8742 Personal history of other diseases of the female genital tract: Secondary | ICD-10-CM | POA: Insufficient documentation

## 2012-10-17 DIAGNOSIS — R51 Headache: Secondary | ICD-10-CM | POA: Insufficient documentation

## 2012-10-17 MED ORDER — HYDROCODONE-ACETAMINOPHEN 5-325 MG PO TABS
2.0000 | ORAL_TABLET | Freq: Once | ORAL | Status: AC
  Administered 2012-10-17: 2 via ORAL
  Filled 2012-10-17: qty 2

## 2012-10-17 MED ORDER — DICLOFENAC SODIUM 75 MG PO TBEC: 75.0000 mg | DELAYED_RELEASE_TABLET | Freq: Two times a day (BID) | ORAL | Status: DC

## 2012-10-17 MED ORDER — PROMETHAZINE HCL 12.5 MG PO TABS
12.5000 mg | ORAL_TABLET | Freq: Once | ORAL | Status: AC
  Administered 2012-10-17: 12.5 mg via ORAL
  Filled 2012-10-17: qty 1

## 2012-10-17 MED ORDER — HYDROCODONE-ACETAMINOPHEN 5-325 MG PO TABS: 1.0000 | ORAL_TABLET | ORAL | Status: DC | PRN

## 2012-10-17 MED ORDER — KETOROLAC TROMETHAMINE 10 MG PO TABS
10.0000 mg | ORAL_TABLET | Freq: Once | ORAL | Status: AC
  Administered 2012-10-17: 10 mg via ORAL
  Filled 2012-10-17: qty 1

## 2012-10-17 NOTE — ED Provider Notes (Signed)
History     CSN: [id number]  Arrival date & time 10/17/12  1644   First MD Initiated Contact with Patient 10/17/12 1755      Chief Complaint  Patient presents with  . Foot Pain    (Consider location/radiation/quality/duration/timing/severity/associated sxs/prior treatment) HPI Comments: Patient is a 47 year old female who was treated in the emergency department on March 4 with a skin infection involving the right foot. This infection was in the area of a new tattoo. The patient was treated with Bactrim and Norco. The patient states that approximately 3 days ago she started having some mild discomfort of the heel of the right foot. Early this morning she states that she could barely stand to put any weight on it and it has been" throbbing" since that time. The patient denies any new injury to the right foot. She denies stepping on any sharp objects or any known introduction of foreign body to the foot. She's not had this problem in the past. There's been no previous operations or procedures involving the right foot.  Patient is a 47 y.o. female presenting with lower extremity pain. The history is provided by the patient.  Foot Pain Associated symptoms include headaches. Pertinent negatives include no abdominal pain, arthralgias, chest pain, coughing or neck pain.    Past Medical History  Diagnosis Date  . Panic attack   . Depression   . Endometriosis   . Bipolar 1 disorder   . Headache   . Cancer   . Personality disorder     Past Surgical History  Procedure Laterality Date  . Appendectomy    . Cholecystectomy    . Abdominal hysterectomy    . Neck surgery      No family history on file.  History  Substance Use Topics  . Smoking status: Current Every Day Smoker -- 1.00 packs/day    Types: Cigarettes  . Smokeless tobacco: Not on file  . Alcohol Use: No    OB History   Grav Para Term Preterm Abortions TAB SAB Ect Mult Living                  Review of Systems   Constitutional: Negative for activity change.       All ROS Neg except as noted in HPI  HENT: Negative for nosebleeds and neck pain.   Eyes: Negative for photophobia and discharge.  Respiratory: Negative for cough, shortness of breath and wheezing.   Cardiovascular: Negative for chest pain and palpitations.  Gastrointestinal: Negative for abdominal pain and blood in stool.  Genitourinary: Negative for dysuria, frequency and hematuria.  Musculoskeletal: Negative for back pain and arthralgias.  Skin: Negative.   Neurological: Positive for headaches. Negative for dizziness, seizures and speech difficulty.  Psychiatric/Behavioral: Negative for hallucinations and confusion. The patient is nervous/anxious.        Depression    Allergies  Morphine and related  Home Medications   Current Outpatient Rx  Name  Route  Sig  Dispense  Refill  . diazepam (VALIUM) 10 MG tablet   Oral   Take 10 mg by mouth 3 (three) times daily.         Marland Kitchen gabapentin (NEURONTIN) 400 MG capsule   Oral   Take 400 mg by mouth 3 (three) times daily.         Marland Kitchen HYDROcodone-acetaminophen (NORCO/VICODIN) 5-325 MG per tablet   Oral   Take 1 tablet by mouth every 4 (four) hours as needed for pain.   20  tablet   0   . lamoTRIgine (LAMICTAL) 100 MG tablet   Oral   Take 100 mg by mouth 2 (two) times daily.          . Multiple Vitamins-Minerals (MULTIVITAMINS THER. W/MINERALS) TABS   Oral   Take 1 tablet by mouth daily.         . OXcarbazepine (TRILEPTAL) 150 MG tablet   Oral   Take 150 mg by mouth daily.         Marland Kitchen sulfamethoxazole-trimethoprim (SEPTRA DS) 800-160 MG per tablet   Oral   Take 1 tablet by mouth every 12 (twelve) hours.   20 tablet   0     BP 120/70  Pulse 75  Temp(Src) 98.1 F (36.7 C) (Oral)  Resp 18  Ht 5\' 8"  (1.727 m)  Wt 205 lb (92.987 kg)  BMI 31.18 kg/m2  SpO2 96%  Physical Exam  Nursing note and vitals reviewed. Constitutional: She is oriented to person, place,  and time. She appears well-developed and well-nourished.  Non-toxic appearance.  HENT:  Head: Normocephalic.  Right Ear: Tympanic membrane and external ear normal.  Left Ear: Tympanic membrane and external ear normal.  Eyes: EOM and lids are normal. Pupils are equal, round, and reactive to light.  Neck: Normal range of motion. Neck supple. Carotid bruit is not present.  Cardiovascular: Normal rate, regular rhythm, normal heart sounds, intact distal pulses and normal pulses.   Pulmonary/Chest: Breath sounds normal. No respiratory distress.  Abdominal: Soft. Bowel sounds are normal. There is no tenderness. There is no guarding.  Musculoskeletal: Normal range of motion.  There is a resolving rash around the tattoo of the dorsum of the right foot extending into the right ankle. The dorsalis pedis is 2+ bilaterally. The capillary refill of the right foot is less than 3 seconds. There is pain to palpation and movement of the Achilles tendon. There is mild increased redness and significant tenderness of the heel of the right foot. No red streaking appreciated. No weeping lesions appreciated.  Lymphadenopathy:       Head (right side): No submandibular adenopathy present.       Head (left side): No submandibular adenopathy present.    She has no cervical adenopathy.  Neurological: She is alert and oriented to person, place, and time. She has normal strength. No cranial nerve deficit or sensory deficit.  Skin: Skin is warm and dry.  Psychiatric: She has a normal mood and affect. Her speech is normal.    ED Course  Procedures (including critical care time)  Labs Reviewed - No data to display No results found. Pulse oximetry 96% on room air. Within normal limits by my interpretation.  No diagnosis found.    MDM  I have reviewed nursing notes, vital signs, and all appropriate lab and imaging results for this patient.  X-ray of the right foot reveals degenerative changes present. There is also a  small plantar calcaneal spur present. The previously infected area of the foot and ankle has cleared. The vital signs are stable. Suspect that the heel pain is related to the spur as well as the degenerative changes. Patient has been encouraged to see one of the podiatry specialist for additional evaluation and management. Prescription for diclofenac 2 times daily, and Norco every 4 hours as needed for pain, #15 tablets given to the patient.      Kathie Dike, PA-C 10/17/12 1907

## 2012-10-17 NOTE — ED Notes (Signed)
States she was see nand treated for an infection inn her right foot last week, states she is having trouble with pain in her right heel now

## 2012-10-17 NOTE — ED Notes (Signed)
Pt alert & oriented x4, stable gait. Patient given discharge instructions, paperwork & prescription(s). Patient  instructed to stop at the registration desk to finish any additional paperwork. Patient verbalized understanding. Pt left department w/ no further questions. 

## 2012-10-18 NOTE — ED Provider Notes (Signed)
Medical screening examination/treatment/procedure(s) were performed by non-physician practitioner and as supervising physician I was immediately available for consultation/collaboration. Devoria Albe, MD, FACEP   Ward Givens, MD 10/18/12 Lyda Jester

## 2012-11-07 ENCOUNTER — Emergency Department (HOSPITAL_COMMUNITY): Payer: Self-pay

## 2012-11-07 ENCOUNTER — Emergency Department (HOSPITAL_COMMUNITY)
Admission: EM | Admit: 2012-11-07 | Discharge: 2012-11-07 | Disposition: A | Discharge status: Home / Self Care | Payer: Self-pay | Attending: Emergency Medicine | Admitting: Emergency Medicine

## 2012-11-07 ENCOUNTER — Encounter (HOSPITAL_COMMUNITY): Payer: Self-pay

## 2012-11-07 DIAGNOSIS — K625 Hemorrhage of anus and rectum: Secondary | ICD-10-CM | POA: Insufficient documentation

## 2012-11-07 DIAGNOSIS — F319 Bipolar disorder, unspecified: Secondary | ICD-10-CM | POA: Insufficient documentation

## 2012-11-07 DIAGNOSIS — F17200 Nicotine dependence, unspecified, uncomplicated: Secondary | ICD-10-CM | POA: Insufficient documentation

## 2012-11-07 DIAGNOSIS — F329 Major depressive disorder, single episode, unspecified: Secondary | ICD-10-CM | POA: Insufficient documentation

## 2012-11-07 DIAGNOSIS — Z8742 Personal history of other diseases of the female genital tract: Secondary | ICD-10-CM | POA: Insufficient documentation

## 2012-11-07 DIAGNOSIS — Z79899 Other long term (current) drug therapy: Secondary | ICD-10-CM | POA: Insufficient documentation

## 2012-11-07 DIAGNOSIS — Z9089 Acquired absence of other organs: Secondary | ICD-10-CM | POA: Insufficient documentation

## 2012-11-07 DIAGNOSIS — F3289 Other specified depressive episodes: Secondary | ICD-10-CM | POA: Insufficient documentation

## 2012-11-07 DIAGNOSIS — F410 Panic disorder [episodic paroxysmal anxiety] without agoraphobia: Secondary | ICD-10-CM | POA: Insufficient documentation

## 2012-11-07 DIAGNOSIS — R109 Unspecified abdominal pain: Secondary | ICD-10-CM | POA: Insufficient documentation

## 2012-11-07 DIAGNOSIS — Z859 Personal history of malignant neoplasm, unspecified: Secondary | ICD-10-CM | POA: Insufficient documentation

## 2012-11-07 LAB — COMPREHENSIVE METABOLIC PANEL
ALT: 87 U/L — ABNORMAL HIGH (ref 0–35)
Alkaline Phosphatase: 128 U/L — ABNORMAL HIGH (ref 39–117)
CO2: 27 mEq/L (ref 19–32)
Calcium: 10.1 mg/dL (ref 8.4–10.5)
GFR calc Af Amer: >90 mL/min (ref >90)
GFR calc non Af Amer: 80 mL/min — ABNORMAL LOW (ref >90)
Glucose, Bld: 96 mg/dL (ref 70–99)
Sodium: 138 mEq/L (ref 135–145)

## 2012-11-07 LAB — CBC WITH DIFFERENTIAL/PLATELET
Eosinophils Relative: 1 % (ref 0–5)
HCT: 43.7 % (ref 36.0–46.0)
Hemoglobin: 15.1 g/dL — ABNORMAL HIGH (ref 12.0–15.0)
Lymphocytes Relative: 33 % (ref 12–46)
Lymphs Abs: 3.1 10*3/uL (ref 0.7–4.0)
MCV: 93.2 fL (ref 78.0–100.0)
Monocytes Absolute: 0.6 10*3/uL (ref 0.1–1.0)
Platelets: 250 10*3/uL (ref 150–400)
RBC: 4.69 MIL/uL (ref 3.87–5.11)
WBC: 9.3 10*3/uL (ref 4.0–10.5)

## 2012-11-07 LAB — TYPE AND SCREEN

## 2012-11-07 MED ORDER — SODIUM CHLORIDE 0.9 % IV BOLUS (SEPSIS)
1000.0000 mL | Freq: Once | INTRAVENOUS | Status: AC
  Administered 2012-11-07: 1000 mL via INTRAVENOUS

## 2012-11-07 MED ORDER — FENTANYL CITRATE 0.05 MG/ML IJ SOLN
100.0000 ug | Freq: Once | INTRAMUSCULAR | Status: AC
  Administered 2012-11-07: 100 ug via INTRAVENOUS
  Filled 2012-11-07: qty 2

## 2012-11-07 MED ORDER — OXYCODONE-ACETAMINOPHEN 5-325 MG PO TABS: 1.0000 | ORAL_TABLET | Freq: Four times a day (QID) | ORAL | Status: DC | PRN

## 2012-11-07 MED ORDER — ALBUTEROL SULFATE HFA 108 (90 BASE) MCG/ACT IN AERS: 1.0000 | INHALATION_SPRAY | Freq: Four times a day (QID) | RESPIRATORY_TRACT | Status: DC | PRN

## 2012-11-07 MED ORDER — IOHEXOL 300 MG/ML  SOLN
50.0000 mL | Freq: Once | INTRAMUSCULAR | Status: AC | PRN
  Administered 2012-11-07: 50 mL via ORAL

## 2012-11-07 MED ORDER — IOHEXOL 300 MG/ML  SOLN
100.0000 mL | Freq: Once | INTRAMUSCULAR | Status: AC | PRN
  Administered 2012-11-07: 100 mL via INTRAVENOUS

## 2012-11-07 MED ORDER — HYDROCORTISONE ACETATE 25 MG RE SUPP: 25.0000 mg | Freq: Two times a day (BID) | RECTAL | Status: DC

## 2012-11-07 MED ORDER — SODIUM CHLORIDE 0.9 % IV SOLN: Freq: Once | INTRAVENOUS | Status: DC

## 2012-11-07 MED ORDER — DEXTROSE-NACL 5-0.9 % IV SOLN: Freq: Once | INTRAVENOUS | Status: DC

## 2012-11-07 MED ORDER — PREDNISONE 20 MG PO TABS: ORAL_TABLET | ORAL | Status: DC

## 2012-11-07 MED ORDER — ONDANSETRON HCL 4 MG/2ML IJ SOLN
4.0000 mg | Freq: Once | INTRAMUSCULAR | Status: AC
  Administered 2012-11-07: 4 mg via INTRAVENOUS
  Filled 2012-11-07: qty 2

## 2012-11-07 NOTE — ED Notes (Signed)
Complain of low belly pain and dark rectal bleeding

## 2012-11-07 NOTE — ED Notes (Signed)
POC hemoccult obtained. EDP performed. Result negative. EDP aware.

## 2012-11-07 NOTE — ED Provider Notes (Signed)
History  This chart was scribed for Donnetta Hutching, MD by Bennett Scrape, ED Scribe. This patient was seen in room [facility] and the patient's care was started at 11:06 AM.  CSN: [id number]  Arrival date & time 11/07/12  1041   First MD Initiated Contact with Patient 11/07/12 1106      Chief Complaint  Patient presents with  . Rectal Bleeding     The history is provided by the patient. No language interpreter was used.    Kara Kelly is a 47 y.o. female who presents to the Emergency Department complaining of 4 days of sudden onset, gradually worsening, constant melena described as black with associated LLQ abdominal tenderness described as sharp. She states that it was watery and black with the initial onset and then today she noticed dark red blood. She reports prior less severe episodes of the same. She also c/o one month of CP with associated voice hoarseness and SOB with exertion. She also reports wheezing at night but states that she is a smoker. She denies nausea, emesis, weakness, numbness and HA as associated symptoms. She has a h/o depression, CA and bipolar disorder. She has a h/o 10 laser surgeries for endometriosis, appendectomy, cholecystectomy and abdominal hysterectomy.   No PCP  Past Medical History  Diagnosis Date  . Panic attack   . Depression   . Endometriosis   . Bipolar 1 disorder   . Headache   . Cancer   . Personality disorder     Past Surgical History  Procedure Laterality Date  . Appendectomy    . Cholecystectomy    . Abdominal hysterectomy    . Neck surgery      No family history on file.  History  Substance Use Topics  . Smoking status: Current Every Day Smoker -- 1.00 packs/day    Types: Cigarettes  . Smokeless tobacco: Not on file  . Alcohol Use: No    No OB history provided.  Review of Systems  A complete 10 system review of systems was obtained and all systems are negative except as noted in the HPI and PMH.   Allergies   Morphine and related  Home Medications   Current Outpatient Rx  Name  Route  Sig  Dispense  Refill  . diazepam (VALIUM) 10 MG tablet   Oral   Take 10 mg by mouth 3 (three) times daily.         . diclofenac (VOLTAREN) 75 MG EC tablet   Oral   Take 1 tablet (75 mg total) by mouth 2 (two) times daily.   12 tablet   0   . gabapentin (NEURONTIN) 400 MG capsule   Oral   Take 400 mg by mouth 3 (three) times daily.         Marland Kitchen HYDROcodone-acetaminophen (NORCO/VICODIN) 5-325 MG per tablet   Oral   Take 1 tablet by mouth every 4 (four) hours as needed for pain.   20 tablet   0   . HYDROcodone-acetaminophen (NORCO/VICODIN) 5-325 MG per tablet   Oral   Take 1 tablet by mouth every 4 (four) hours as needed for pain.   15 tablet   0   . lamoTRIgine (LAMICTAL) 100 MG tablet   Oral   Take 100 mg by mouth 2 (two) times daily.          . Multiple Vitamins-Minerals (MULTIVITAMINS THER. W/MINERALS) TABS   Oral   Take 1 tablet by mouth daily.         Marland Kitchen  OXcarbazepine (TRILEPTAL) 150 MG tablet   Oral   Take 150 mg by mouth daily.         Marland Kitchen sulfamethoxazole-trimethoprim (SEPTRA DS) 800-160 MG per tablet   Oral   Take 1 tablet by mouth every 12 (twelve) hours.   20 tablet   0     Triage Vitals: BP 128/77  Pulse 95  Temp(Src) 98 F (36.7 C) (Oral)  Resp 18  Ht 5\' 8"  (1.727 m)  Wt 200 lb (90.719 kg)  BMI 30.42 kg/m2  SpO2 98%  Physical Exam  Nursing note and vitals reviewed. Constitutional: She is oriented to person, place, and time. She appears well-developed and well-nourished.  HENT:  Head: Normocephalic and atraumatic.  Eyes: Conjunctivae and EOM are normal. Pupils are equal, round, and reactive to light.  Neck: Normal range of motion. Neck supple.  Cardiovascular: Normal rate, regular rhythm and normal heart sounds.   Pulmonary/Chest: Effort normal and breath sounds normal.  Abdominal: Soft. Bowel sounds are normal. There is tenderness (diffusely tender,  worse in LLQ).  Genitourinary:  No masses, good rectal tone, heme negative, no gross blood present, chaperone present  Musculoskeletal: Normal range of motion.  Neurological: She is alert and oriented to person, place, and time.  Skin: Skin is warm and dry.  Psychiatric: She has a normal mood and affect.    ED Course  Procedures (including critical care time)  DIAGNOSTIC STUDIES: Oxygen Saturation is 98% on room air, normal by my interpretation.    COORDINATION OF CARE: 11:56 AM-Advised pt to stop smoking. Discussed treatment plan which includes CT abdomen, CXR, CBC panel, CMP, and troponin with pt at bedside and pt agreed to plan.   Labs Reviewed - No data to display No results found. Results for orders placed during the hospital encounter of 11/07/12  CBC WITH DIFFERENTIAL      Result Value Range   WBC 9.3  4.0 - 10.5 K/uL   RBC 4.69  3.87 - 5.11 MIL/uL   Hemoglobin 15.1 (*) 12.0 - 15.0 g/dL   HCT 16.1  09.6 - 04.5 %   MCV 93.2  78.0 - 100.0 fL   MCH 32.2  26.0 - 34.0 pg   MCHC 34.6  30.0 - 36.0 g/dL   RDW 40.9  81.1 - 91.4 %   Platelets 250  150 - 400 K/uL   Neutrophils Relative 58  43 - 77 %   Neutro Abs 5.4  1.7 - 7.7 K/uL   Lymphocytes Relative 33  12 - 46 %   Lymphs Abs 3.1  0.7 - 4.0 K/uL   Monocytes Relative 7  3 - 12 %   Monocytes Absolute 0.6  0.1 - 1.0 K/uL   Eosinophils Relative 1  0 - 5 %   Eosinophils Absolute 0.1  0.0 - 0.7 K/uL   Basophils Relative 1  0 - 1 %   Basophils Absolute 0.1  0.0 - 0.1 K/uL  COMPREHENSIVE METABOLIC PANEL      Result Value Range   Sodium 138  135 - 145 mEq/L   Potassium 4.7  3.5 - 5.1 mEq/L   Chloride 103  96 - 112 mEq/L   CO2 27  19 - 32 mEq/L   Glucose, Bld 96  70 - 99 mg/dL   BUN 12  6 - 23 mg/dL   Creatinine, Ser 7.82  0.50 - 1.10 mg/dL   Calcium 95.6  8.4 - 21.3 mg/dL   Total Protein 7.1  6.0 - 8.3  g/dL   Albumin 3.6  3.5 - 5.2 g/dL   AST 71 (*) 0 - 37 U/L   ALT 87 (*) 0 - 35 U/L   Alkaline Phosphatase 128 (*) 39 -  117 U/L   Total Bilirubin 0.2 (*) 0.3 - 1.2 mg/dL   GFR calc non Af Amer 80 (*) >90 mL/min   GFR calc Af Amer >90  >90 mL/min  LIPASE, BLOOD      Result Value Range   Lipase 43  11 - 59 U/L  TROPONIN I      Result Value Range   Troponin I <0.30  <0.30 ng/mL  TYPE AND SCREEN      Result Value Range   ABO/RH(D) B POS     Antibody Screen PENDING     Sample Expiration 11/10/2012       Dg Chest 2 View  11/07/2012  *RADIOLOGY REPORT*  Clinical Data: Rectal bleeding, shortness of breath, chest pain, smoker  CHEST - 2 VIEW  Comparison: 02/14/2012  Findings: Normal heart size and vascularity.  Streaky bibasilar densities compatible with atelectasis.  No CHF, definite pneumonia, effusion or pneumothorax.  Lower cervical fusion hardware evident. Trachea midline.  Chronic compression fracture at approximate T12.  IMPRESSION: Stable exam.  Increased basilar atelectasis.   Original Report Authenticated By: Judie Petit. Miles Costain, M.D.    Ct Abdomen Pelvis W Contrast  11/07/2012  *RADIOLOGY REPORT*  Clinical Data: Rectal bleeding with left lower quadrant pain.  CT ABDOMEN AND PELVIS WITH CONTRAST  Technique:  Multidetector CT imaging of the abdomen and pelvis was performed following the standard protocol during bolus administration of intravenous contrast.  Contrast: 50mL OMNIPAQUE IOHEXOL 300 MG/ML  SOLN, OMNIPAQUE IOHEXOL 300 MG/ML  SOLN  Comparison: 02/16/2006.  Findings: Lung bases show dependent atelectasis bilaterally.  Heart size normal.  No pericardial or pleural effusion. Pre pericardiac lymph nodes are sub centimeter in size.  Mild intrahepatic biliary duct dilatation after cholecystectomy, as before. Right adrenal gland is unremarkable.  Left adrenal nodule measures 1.4 x 2.0 cm, as before.  Right kidney is unremarkable. An 11 mm exophytic lesion off the upper pole left kidney measures 29 HU and appears minimally larger than on 02/15/2006, at which time it measured approximate 7 mm.  Spleen, pancreas,  stomach and bowel are unremarkable. Hysterectomy.  Ovaries are visualized.  No pathologically enlarged lymph nodes.  Scattered atherosclerotic calcification of the arterial vasculature without abdominal aortic aneurysm.  No worrisome lytic or sclerotic lesions. T12 compression fracture is unchanged from 02/09/2007.  IMPRESSION:  1.  No acute findings to explain the patient's given symptoms. 2.  Stable left adrenal nodule, shown represent an adenoma when examination of 08/11/2005 is reviewed. 3.  Very minimal interval enlargement of a hyperdense lesion off the upper pole left kidney from examination performed on almost 7 years ago.  Findings favor a hyperdense cyst.   Original Report Authenticated By: Leanna Battles, M.D.     No diagnosis found.   Date: 11/07/2012  Rate: 76  Rhythm: normal sinus rhythm  QRS Axis: normal  Intervals: normal  ST/T Wave abnormalities: normal  Conduction Disutrbances: none  Narrative Interpretation: unremarkable     MDM    Rectal exam reveals no masses.  Heme-negative.  CT scan revealed no acute pathology.  Discussed stable left adrenal nodule and lesion in the upper pole of left kidney.  These appear to be stable from 2007.   No acute abdomen.   Discharge meds include Percocet #20, albuterol inhaler,  prednisone taper, Anusol suppository.  Patient understands to return if worse    I personally performed the services described in this documentation, which was scribed in my presence. The recorded information has been reviewed and is accurate.    Donnetta Hutching, MD 11/07/12 1501

## 2012-11-17 ENCOUNTER — Emergency Department (HOSPITAL_COMMUNITY): Payer: Self-pay

## 2012-11-17 ENCOUNTER — Encounter (HOSPITAL_COMMUNITY): Payer: Self-pay | Admitting: Nurse Practitioner

## 2012-11-17 ENCOUNTER — Emergency Department (HOSPITAL_COMMUNITY)
Admission: EM | Admit: 2012-11-17 | Discharge: 2012-11-17 | Disposition: A | Discharge status: Home / Self Care | Payer: Self-pay | Attending: Emergency Medicine | Admitting: Emergency Medicine

## 2012-11-17 DIAGNOSIS — Z79899 Other long term (current) drug therapy: Secondary | ICD-10-CM | POA: Insufficient documentation

## 2012-11-17 DIAGNOSIS — Z3202 Encounter for pregnancy test, result negative: Secondary | ICD-10-CM | POA: Insufficient documentation

## 2012-11-17 DIAGNOSIS — Z8679 Personal history of other diseases of the circulatory system: Secondary | ICD-10-CM | POA: Insufficient documentation

## 2012-11-17 DIAGNOSIS — F319 Bipolar disorder, unspecified: Secondary | ICD-10-CM | POA: Insufficient documentation

## 2012-11-17 DIAGNOSIS — F17200 Nicotine dependence, unspecified, uncomplicated: Secondary | ICD-10-CM | POA: Insufficient documentation

## 2012-11-17 DIAGNOSIS — Z859 Personal history of malignant neoplasm, unspecified: Secondary | ICD-10-CM | POA: Insufficient documentation

## 2012-11-17 DIAGNOSIS — Z8659 Personal history of other mental and behavioral disorders: Secondary | ICD-10-CM | POA: Insufficient documentation

## 2012-11-17 DIAGNOSIS — R109 Unspecified abdominal pain: Secondary | ICD-10-CM | POA: Insufficient documentation

## 2012-11-17 DIAGNOSIS — F329 Major depressive disorder, single episode, unspecified: Secondary | ICD-10-CM | POA: Insufficient documentation

## 2012-11-17 DIAGNOSIS — R062 Wheezing: Secondary | ICD-10-CM | POA: Insufficient documentation

## 2012-11-17 DIAGNOSIS — R0789 Other chest pain: Secondary | ICD-10-CM | POA: Insufficient documentation

## 2012-11-17 DIAGNOSIS — R197 Diarrhea, unspecified: Secondary | ICD-10-CM | POA: Insufficient documentation

## 2012-11-17 DIAGNOSIS — R0602 Shortness of breath: Secondary | ICD-10-CM | POA: Insufficient documentation

## 2012-11-17 DIAGNOSIS — R110 Nausea: Secondary | ICD-10-CM | POA: Insufficient documentation

## 2012-11-17 DIAGNOSIS — Z8742 Personal history of other diseases of the female genital tract: Secondary | ICD-10-CM | POA: Insufficient documentation

## 2012-11-17 DIAGNOSIS — F3289 Other specified depressive episodes: Secondary | ICD-10-CM | POA: Insufficient documentation

## 2012-11-17 LAB — COMPREHENSIVE METABOLIC PANEL
AST: 46 U/L — ABNORMAL HIGH (ref 0–37)
Albumin: 3.8 g/dL (ref 3.5–5.2)
Alkaline Phosphatase: 106 U/L (ref 39–117)
BUN: 18 mg/dL (ref 6–23)
CO2: 28 mEq/L (ref 19–32)
Chloride: 103 mEq/L (ref 96–112)
Creatinine, Ser: 1.03 mg/dL (ref 0.50–1.10)
GFR calc non Af Amer: 65 mL/min — ABNORMAL LOW (ref >90)
Potassium: 3.8 mEq/L (ref 3.5–5.1)
Total Bilirubin: 0.3 mg/dL (ref 0.3–1.2)

## 2012-11-17 LAB — CBC WITH DIFFERENTIAL/PLATELET
HCT: 43.9 % (ref 36.0–46.0)
Hemoglobin: 15.7 g/dL — ABNORMAL HIGH (ref 12.0–15.0)
Lymphocytes Relative: 38 % (ref 12–46)
Monocytes Absolute: 0.8 10*3/uL (ref 0.1–1.0)
Monocytes Relative: 7 % (ref 3–12)
Neutro Abs: 5.8 10*3/uL (ref 1.7–7.7)
Neutrophils Relative %: 53 % (ref 43–77)
RBC: 4.84 MIL/uL (ref 3.87–5.11)
WBC: 10.9 10*3/uL — ABNORMAL HIGH (ref 4.0–10.5)

## 2012-11-17 LAB — POCT I-STAT TROPONIN I: Troponin i, poc: 0 ng/mL (ref 0.00–0.08)

## 2012-11-17 LAB — URINALYSIS, MICROSCOPIC ONLY
Glucose, UA: NEGATIVE mg/dL
Hgb urine dipstick: NEGATIVE
Ketones, ur: NEGATIVE mg/dL
Protein, ur: NEGATIVE mg/dL
Urobilinogen, UA: 0.2 mg/dL (ref 0.0–1.0)

## 2012-11-17 LAB — POCT PREGNANCY, URINE: Preg Test, Ur: NEGATIVE

## 2012-11-17 LAB — D-DIMER, QUANTITATIVE: D-Dimer, Quant: <0.27 ug/mL-FEU (ref 0.00–0.48)

## 2012-11-17 LAB — LIPASE, BLOOD: Lipase: 71 U/L — ABNORMAL HIGH (ref 11–59)

## 2012-11-17 MED ORDER — IPRATROPIUM BROMIDE 0.02 % IN SOLN
0.5000 mg | Freq: Once | RESPIRATORY_TRACT | Status: AC
  Administered 2012-11-17: 0.5 mg via RESPIRATORY_TRACT
  Filled 2012-11-17: qty 2.5

## 2012-11-17 MED ORDER — HYDROMORPHONE HCL PF 1 MG/ML IJ SOLN
0.5000 mg | Freq: Once | INTRAMUSCULAR | Status: AC
  Administered 2012-11-17: 0.5 mg via INTRAVENOUS
  Filled 2012-11-17: qty 1

## 2012-11-17 MED ORDER — ALBUTEROL SULFATE (5 MG/ML) 0.5% IN NEBU
2.5000 mg | INHALATION_SOLUTION | Freq: Once | RESPIRATORY_TRACT | Status: AC
  Administered 2012-11-17: 2.5 mg via RESPIRATORY_TRACT
  Filled 2012-11-17: qty 0.5

## 2012-11-17 MED ORDER — OXYCODONE-ACETAMINOPHEN 5-325 MG PO TABS: 2.0000 | ORAL_TABLET | ORAL | Status: DC | PRN

## 2012-11-17 MED ORDER — KETOROLAC TROMETHAMINE 30 MG/ML IJ SOLN
INTRAMUSCULAR | Status: AC
  Administered 2012-11-17: 30 mg via INTRAVENOUS
  Filled 2012-11-17: qty 1

## 2012-11-17 MED ORDER — KETOROLAC TROMETHAMINE 30 MG/ML IJ SOLN: 30.0000 mg | Freq: Once | INTRAMUSCULAR | Status: AC

## 2012-11-17 MED ORDER — OXYCODONE-ACETAMINOPHEN 5-325 MG PO TABS
2.0000 | ORAL_TABLET | Freq: Once | ORAL | Status: AC
  Administered 2012-11-17: 2 via ORAL
  Filled 2012-11-17: qty 2

## 2012-11-17 NOTE — ED Notes (Signed)
States she was seen at [facility] 2 weeks ago for abd pain, had a CT scan and "they told me I had a lot of cysts and my liver looked bad."  Reports she was unable to f/u with another doctor afterwards and continues to have severe abd pain and SOB. A&Ox4, able to speak in full sentences.

## 2012-11-17 NOTE — ED Provider Notes (Signed)
History     CSN: [id number]  Arrival date & time 11/17/12  1428   First MD Initiated Contact with Patient 11/17/12 1436      Chief Complaint  Patient presents with  . Abdominal Pain    (Consider location/radiation/quality/duration/timing/severity/associated sxs/prior treatment) HPI Comments: Patient with extensive psychiatric history and hx of endometriosis presents for constant LLQ pain that is throbbing and stabbing in quality, radiating to her suprapubic region and L lateral side x 1 month. Patient denies aggravating or alleviating factors of her symptoms. She admits to associated nausea, shortness of breath, and intermittent black watery diarrhea without evidence of gross blood. Patient has hx of appendectomy, cholecystectomy, and abdominal hysterectomy. Patient was evaluated for complaints at [facility] 2 weeks ago and work up with CT abdomen/pelvis, CXR, CBC, CMP, lipase, troponin, hemoccult, and type and screen with no acute findings; CT visualized adrenal nodule and cyst at upper pole of L kidney, both stable since 2007. Patient states symptoms have persisted without any new symptom onset. Patient states last BM was 1 hour prior to arrival and was black and watery.  Patient is a 47 y.o. female presenting with abdominal pain. The history is provided by the patient. No language interpreter was used.  Abdominal Pain Associated symptoms: diarrhea, nausea and shortness of breath   Associated symptoms: no chest pain, no dysuria, no fever, no hematuria and no vomiting     Past Medical History  Diagnosis Date  . Panic attack   . Depression   . Endometriosis   . Bipolar 1 disorder   . Headache   . Cancer   . Personality disorder     Past Surgical History  Procedure Laterality Date  . Appendectomy    . Cholecystectomy    . Abdominal hysterectomy    . Neck surgery      History reviewed. No pertinent family history.  History  Substance Use Topics  . Smoking status: Current  Every Day Smoker -- 1.00 packs/day    Types: Cigarettes  . Smokeless tobacco: Not on file  . Alcohol Use: No    OB History   Grav Para Term Preterm Abortions TAB SAB Ect Mult Living                  Review of Systems  Constitutional: Negative for fever.  Respiratory: Positive for chest tightness and shortness of breath.   Cardiovascular: Negative for chest pain.  Gastrointestinal: Positive for nausea, abdominal pain and diarrhea. Negative for vomiting.  Genitourinary: Negative for dysuria and hematuria.  Skin: Negative for rash.  Neurological: Negative for numbness.  All other systems reviewed and are negative.    Allergies  Morphine and related  Home Medications   Current Outpatient Rx  Name  Route  Sig  Dispense  Refill  . albuterol (PROVENTIL HFA;VENTOLIN HFA) 108 (90 BASE) MCG/ACT inhaler   Inhalation   Inhale 1-2 puffs into the lungs every 6 (six) hours as needed for wheezing.   1 Inhaler   1   . diazepam (VALIUM) 10 MG tablet   Oral   Take 10 mg by mouth 3 (three) times daily.         Marland Kitchen doxepin (SINEQUAN) 50 MG capsule   Oral   Take 50 mg by mouth at bedtime.         . gabapentin (NEURONTIN) 300 MG capsule   Oral   Take 300 mg by mouth 3 (three) times daily.         Marland Kitchen  HYDROcodone-acetaminophen (NORCO/VICODIN) 5-325 MG per tablet   Oral   Take 1 tablet by mouth every 4 (four) hours as needed for pain.   20 tablet   0   . hydrocortisone (ANUSOL-HC) 25 MG suppository   Rectal   Place 1 suppository (25 mg total) rectally 2 (two) times daily. For 7 days   14 suppository   0   . hydrOXYzine (ATARAX/VISTARIL) 50 MG tablet   Oral   Take 50 mg by mouth 3 (three) times daily.         Marland Kitchen lamoTRIgine (LAMICTAL) 100 MG tablet   Oral   Take 200 mg by mouth at bedtime.         . mirtazapine (REMERON) 15 MG tablet   Oral   Take 15 mg by mouth at bedtime.         . Multiple Vitamins-Minerals (MULTIVITAMINS THER. W/MINERALS) TABS   Oral   Take  1 tablet by mouth daily.         . Nutritional Supplements (ESTROVEN PO)   Oral   Take 1 tablet by mouth daily.         Marland Kitchen oxcarbazepine (TRILEPTAL) 600 MG tablet   Oral   Take 600 mg by mouth 2 (two) times daily.         . risperiDONE (RISPERDAL) 1 MG tablet   Oral   Take 1 mg by mouth at bedtime.         Marland Kitchen oxyCODONE-acetaminophen (PERCOCET/ROXICET) 5-325 MG per tablet   Oral   Take 2 tablets by mouth every 4 (four) hours as needed for pain.   10 tablet   0     BP 113/69  Pulse 92  Temp(Src) 97.9 F (36.6 C) (Oral)  Resp 17  SpO2 91%  Physical Exam  Nursing note and vitals reviewed. Constitutional: She is oriented to person, place, and time. She appears well-developed and well-nourished.  HENT:  Head: Normocephalic and atraumatic.  Mouth/Throat: Oropharynx is clear and moist. No oropharyngeal exudate.  Eyes: Conjunctivae are normal. Pupils are equal, round, and reactive to light. No scleral icterus.  Neck: Normal range of motion. Neck supple.  Cardiovascular: Normal rate, regular rhythm, normal heart sounds and intact distal pulses.   Pulmonary/Chest: She has wheezes. She has no rales. She exhibits no tenderness.  Poor inspiratory effort with diffuse decreased breath sounds and mild expiratory wheezes  Abdominal: Soft. She exhibits no distension. There is tenderness. There is no rebound.  Patient diffusely tender to palpation with voluntary guarding and without peritoneal signs. No palpable masses appreciated.  Genitourinary: Rectal exam shows no fissure, no mass and anal tone normal. Guaiac negative stool.  Soft brownish yellow stool without gross blood appreciated on rectal exam  Musculoskeletal: Normal range of motion. She exhibits no edema.  Lymphadenopathy:    She has no cervical adenopathy.  Neurological: She is alert and oriented to person, place, and time.  Skin: Skin is warm and dry. No rash noted. No erythema.  Psychiatric: She has a normal mood and  affect. Her behavior is normal.    ED Course  Procedures (including critical care time)  Labs Reviewed  CBC WITH DIFFERENTIAL - Abnormal; Notable for the following:    WBC 10.9 (*)    Hemoglobin 15.7 (*)    Lymphs Abs 4.1 (*)    All other components within normal limits  COMPREHENSIVE METABOLIC PANEL - Abnormal; Notable for the following:    Glucose, Bld 100 (*)    AST  46 (*)    ALT 72 (*)    GFR calc non Af Amer 65 (*)    GFR calc Af Amer 75 (*)    All other components within normal limits  URINALYSIS, MICROSCOPIC ONLY - Abnormal; Notable for the following:    APPearance CLOUDY (*)    Squamous Epithelial / LPF MANY (*)    Casts HYALINE CASTS (*)    All other components within normal limits  LIPASE, BLOOD - Abnormal; Notable for the following:    Lipase 71 (*)    All other components within normal limits  STOOL CULTURE  D-DIMER, QUANTITATIVE  OCCULT BLOOD X 1 CARD TO LAB, STOOL  POCT I-STAT TROPONIN I  POCT PREGNANCY, URINE   Dg Chest 2 View  11/17/2012  *RADIOLOGY REPORT*  Clinical Data: Shortness of breath, chest pain, cough, congestion  CHEST - 2 VIEW  Comparison: 11/07/2012  Findings: Lungs are clear. No pleural effusion or pneumothorax.  Cardiomediastinal silhouette is within normal limits.  Stable mild superior endplate compression deformity of the T12 vertebral body. Cervical spine fixation hardware.  Cholecystectomy clips.  IMPRESSION: No evidence of acute cardiopulmonary disease.   Original Report Authenticated By: Charline Bills, M.D.    US Transvaginal Non-ob  11/17/2012  *RADIOLOGY REPORT*  Clinical Data:  47 year old female with abdominal pain.  Left pelvic pain.  Lower quadrant pain.  Prior hysterectomy.  TRANSABDOMINAL AND TRANSVAGINAL ULTRASOUND OF PELVIS DOPPLER ULTRASOUND OF OVARIES  Technique:  Both transabdominal and transvaginal ultrasound examinations of the pelvis were performed. Transabdominal technique was performed for global imaging of the pelvis  including uterus, ovaries, adnexal regions, and pelvic cul-de-sac.  It was necessary to proceed with endovaginal exam following the transabdominal exam to attempt to visualize the adnexa.  Color and duplex Doppler ultrasound was utilized to evaluate blood flow to the ovaries.  Comparison:  CT abdomen and pelvis 11/07/2012 and earlier.  Findings:  Uterus:  Surgically absent.  Endometrium:  Surgically absent.  Both ovaries are visible on the recent pelvis CT, but were situated near bowel.  Both ovaries are not enlarged at that time with no associated inflammation or mass. There was no pelvic free fluid at that time.  Today, neither ovary could be delineated despite transabdominal and transvaginal imaging attempts.  Other findings:  No pelvic free fluid.  Pulsed Doppler evaluation:  Ovaries not discernible despite transabdominal or transvaginal imaging attempts.  demonstrates normal low-resistance arterial and venous waveforms in both ovaries.  IMPRESSION: 1.  Ovaries not delineated by ultrasound despite transabdominal and transvaginal imaging attempts.  Both ovaries were visible by CT on 11/07/2012, and had a normal CT appearance at that time.  No pelvic free fluid. 2.  Uterus surgically absent.   Original Report Authenticated By: Erskine Speed, M.D.    US Pelvis Complete  11/17/2012  *RADIOLOGY REPORT*  Clinical Data:  47 year old female with abdominal pain.  Left pelvic pain.  Lower quadrant pain.  Prior hysterectomy.  TRANSABDOMINAL AND TRANSVAGINAL ULTRASOUND OF PELVIS DOPPLER ULTRASOUND OF OVARIES  Technique:  Both transabdominal and transvaginal ultrasound examinations of the pelvis were performed. Transabdominal technique was performed for global imaging of the pelvis including uterus, ovaries, adnexal regions, and pelvic cul-de-sac.  It was necessary to proceed with endovaginal exam following the transabdominal exam to attempt to visualize the adnexa.  Color and duplex Doppler ultrasound was utilized to  evaluate blood flow to the ovaries.  Comparison:  CT abdomen and pelvis 11/07/2012 and earlier.  Findings:  Uterus:  Surgically absent.  Endometrium:  Surgically absent.  Both ovaries are visible on the recent pelvis CT, but were situated near bowel.  Both ovaries are not enlarged at that time with no associated inflammation or mass. There was no pelvic free fluid at that time.  Today, neither ovary could be delineated despite transabdominal and transvaginal imaging attempts.  Other findings:  No pelvic free fluid.  Pulsed Doppler evaluation:  Ovaries not discernible despite transabdominal or transvaginal imaging attempts.  demonstrates normal low-resistance arterial and venous waveforms in both ovaries.  IMPRESSION: 1.  Ovaries not delineated by ultrasound despite transabdominal and transvaginal imaging attempts.  Both ovaries were visible by CT on 11/07/2012, and had a normal CT appearance at that time.  No pelvic free fluid. 2.  Uterus surgically absent.   Original Report Authenticated By: Erskine Speed, M.D.      Date: 11/17/2012  Rate: 88  Rhythm: normal sinus rhythm  QRS Axis: right  Intervals: normal  ST/T Wave abnormalities: normal  Conduction Disutrbances:2 PVCs  Narrative Interpretation: NSR with borderline right axis and 2 PVCs; no STEMI  Old EKG Reviewed: no PVCs on prior EKG; otherwise unchanged I have personally reviewed and interpreted this EKG.   1. Abdominal pain     MDM  Patient presents for constant LLQ pain that is throbbing and stabbing in quality, radiating to her suprapubic region and L lateral side x 1 month with associated SOB. On exam patient with poor inspiratory effort and decreased breath sounds b/l as well as diffuse TTP on abdominal exam with voluntary guarding. Rectal exam significant for soft brownish yellow stool without gross blood. Patient was evaluated 2 weeks prior with CXR and CT abdomen/pelvis, both of which were unremarkable. W/u today to include CBC, CMP,  lipase, troponin, hemoccult, UA, urine pregnancy, EKG, CXR, and Pelvic U/S. Patient with a hx of anxiety which I suspect to be the cause of her SOB, however d-dimer ordered for further evaluation of symptoms.  Patient's work up significant for mild leukocytosis. AST/ALT elevated, however decreased from previous work up. All other labs c/w prior work ups and d-dimer not elevated to suspect DVT/PE as cause of SOB symptoms. I have personally reviewed this patient's CXR which shows no evidence of PTX, pleural effusion, PNA, or infiltrate. Patient states that during U/S "they hit the exact spot where my pain is"; no U/S findings to correlate with source of patient's pain, however, as overall study negative for acute process. I have reviewed all of these results with the patient who verbalizes her understanding.  Patient has remained well and nontoxic appearing and hemodynamically stable; pain controlled with IV narcotic pain medicine. Patient stable for d/c with GI follow up and percocet to take as needed for pain. Indications for ED return discussed. Patient states comfort and understanding with this d/c plan with no unaddressed concerns.   Antony Madura, PA-C 11/19/12 1655

## 2012-11-17 NOTE — ED Notes (Signed)
Occult blood collected buy PAC [facility]umes

## 2012-11-17 NOTE — ED Provider Notes (Signed)
47 year old female, history of chronic abdominal pain including a history of endometriosis, status post hysterectomy many years ago at the age of 74, has been told that she had ruptured ovarian cysts in the past who presents with 4 weeks of left lower quadrant pain, some radiation from the flank to the groin, denies dysuria, hematuria, but has been having diarrhea for the last several weeks. She denies any blood in her stool, states that it is loose sometimes watery and sometimes normal. She denies fevers or chills and on my exam has a soft abdomen which is tender in the left lower quadrant, minimal tenderness to the epigastrium and no tenderness on the right lower quadrant. There is no peritoneal signs, no tachycardia, no abnormal lung sounds, the patient appears comfortable other than her left lower quadrant pain. This is the same pain for which she was evaluated with CT scan and laboratory workup at her last visit in the last month, no definite etiology was seen at that time. We'll proceed with ultrasound of the pelvis to evaluate pelvic organs otherwise patient appears hemodynamically stable, pain medications given.  Medical screening examination/treatment/procedure(s) were conducted as a shared visit with non-physician practitioner(s) and myself.  I personally evaluated the patient during the encounter    Vida Roller, MD 11/17/12 [phone]

## 2012-11-19 NOTE — ED Provider Notes (Signed)
Medical screening examination/treatment/procedure(s) were conducted as a shared visit with non-physician practitioner(s) and myself.  I personally evaluated the patient during the encounter  Please see my separate respective documentation pertaining to this patient encounter   Vida Roller, MD 11/19/12 [phone]

## 2012-12-16 ENCOUNTER — Encounter (HOSPITAL_COMMUNITY): Payer: Self-pay | Admitting: *Deleted

## 2012-12-16 ENCOUNTER — Emergency Department (HOSPITAL_COMMUNITY): Payer: Self-pay

## 2012-12-16 ENCOUNTER — Emergency Department (HOSPITAL_COMMUNITY)
Admission: EM | Admit: 2012-12-16 | Discharge: 2012-12-16 | Disposition: A | Discharge status: Home / Self Care | Payer: Self-pay | Attending: Emergency Medicine | Admitting: Emergency Medicine

## 2012-12-16 DIAGNOSIS — R209 Unspecified disturbances of skin sensation: Secondary | ICD-10-CM | POA: Insufficient documentation

## 2012-12-16 DIAGNOSIS — M5412 Radiculopathy, cervical region: Secondary | ICD-10-CM

## 2012-12-16 DIAGNOSIS — F319 Bipolar disorder, unspecified: Secondary | ICD-10-CM | POA: Insufficient documentation

## 2012-12-16 DIAGNOSIS — Z8742 Personal history of other diseases of the female genital tract: Secondary | ICD-10-CM | POA: Insufficient documentation

## 2012-12-16 DIAGNOSIS — Z859 Personal history of malignant neoplasm, unspecified: Secondary | ICD-10-CM | POA: Insufficient documentation

## 2012-12-16 DIAGNOSIS — M2550 Pain in unspecified joint: Secondary | ICD-10-CM | POA: Insufficient documentation

## 2012-12-16 DIAGNOSIS — Z79899 Other long term (current) drug therapy: Secondary | ICD-10-CM | POA: Insufficient documentation

## 2012-12-16 DIAGNOSIS — F410 Panic disorder [episodic paroxysmal anxiety] without agoraphobia: Secondary | ICD-10-CM | POA: Insufficient documentation

## 2012-12-16 DIAGNOSIS — F17200 Nicotine dependence, unspecified, uncomplicated: Secondary | ICD-10-CM | POA: Insufficient documentation

## 2012-12-16 MED ORDER — OXYCODONE-ACETAMINOPHEN 5-325 MG PO TABS: 1.0000 | ORAL_TABLET | Freq: Once | ORAL | Status: DC

## 2012-12-16 MED ORDER — OXYCODONE-ACETAMINOPHEN 5-325 MG PO TABS
ORAL_TABLET | ORAL | Status: AC
  Administered 2012-12-16: 1 via ORAL
  Filled 2012-12-16: qty 1

## 2012-12-16 MED ORDER — HYDROCODONE-ACETAMINOPHEN 5-325 MG PO TABS
1.0000 | ORAL_TABLET | Freq: Once | ORAL | Status: AC
  Administered 2012-12-16: 1 via ORAL
  Filled 2012-12-16: qty 1

## 2012-12-16 MED ORDER — OXYCODONE-ACETAMINOPHEN 5-325 MG PO TABS: 1.0000 | ORAL_TABLET | ORAL | Status: DC | PRN

## 2012-12-16 MED ORDER — OXYCODONE-ACETAMINOPHEN 5-325 MG PO TABS: 1.0000 | ORAL_TABLET | Freq: Once | ORAL | Status: AC

## 2012-12-16 NOTE — ED Notes (Signed)
Pt was working out in her yard yesterday, picking up items, today c/o left neck pain that radiates down left arm, worse with movement, tingling to left hand.

## 2012-12-16 NOTE — ED Notes (Signed)
Pt c/o neck pain that radiates down left arm since Friday. Pt states she "picked up a chain" Friday afternoon and the pain began shortly after. Pt reports tingling in left fingers.

## 2012-12-18 NOTE — ED Provider Notes (Signed)
History     CSN: [id number]  Arrival date & time 12/16/12  1511   First MD Initiated Contact with Patient 12/16/12 1524      Chief Complaint  Patient presents with  . Neck Pain    (Consider location/radiation/quality/duration/timing/severity/associated sxs/prior treatment) HPI Comments: Kara Kelly is a 47 y.o. Female presenting with pain and tightness in her left neck area which radiates down her left arm,  Describing a burning sensation along with tingling in her left hand since working in her yard yesterday.  She denies injury.  She does have a history of cervical neck fusion by Dr. Franky Macho years ago and has had had intermittent episodes of pain with overuse,  Denies history of radiation of pain.  She denies weakness in her arms and hands.  Pain is worse with movement,  Better but not resolved at rest.  She denies headache, rash and fevers.  She has tried no medications for relief of her symptoms but does take valium and gabapentin daily for chronic pain.      The history is provided by the patient.    Past Medical History  Diagnosis Date  . Panic attack   . Depression   . Endometriosis   . Bipolar 1 disorder   . Headache   . Cancer   . Personality disorder     Past Surgical History  Procedure Laterality Date  . Appendectomy    . Cholecystectomy    . Abdominal hysterectomy    . Neck surgery      No family history on file.  History  Substance Use Topics  . Smoking status: Current Every Day Smoker -- 1.00 packs/day    Types: Cigarettes  . Smokeless tobacco: Not on file  . Alcohol Use: No    OB History   Grav Para Term Preterm Abortions TAB SAB Ect Mult Living                  Review of Systems  Constitutional: Negative for fever and chills.  HENT: Positive for neck pain. Negative for neck stiffness.   Eyes: Negative for visual disturbance.  Musculoskeletal: Positive for arthralgias. Negative for myalgias and joint swelling.  Neurological:  Positive for numbness. Negative for weakness.    Allergies  Flexeril and Morphine and related  Home Medications   Current Outpatient Rx  Name  Route  Sig  Dispense  Refill  . albuterol (PROVENTIL HFA;VENTOLIN HFA) 108 (90 BASE) MCG/ACT inhaler   Inhalation   Inhale 1-2 puffs into the lungs every 6 (six) hours as needed for wheezing.   1 Inhaler   1   . diazepam (VALIUM) 10 MG tablet   Oral   Take 10 mg by mouth 3 (three) times daily.         Marland Kitchen doxepin (SINEQUAN) 50 MG capsule   Oral   Take 50 mg by mouth at bedtime.         . gabapentin (NEURONTIN) 400 MG capsule   Oral   Take 400 mg by mouth 4 (four) times daily.         . hydrocortisone (ANUSOL-HC) 25 MG suppository   Rectal   Place 1 suppository (25 mg total) rectally 2 (two) times daily. For 7 days   14 suppository   0   . hydrOXYzine (ATARAX/VISTARIL) 50 MG tablet   Oral   Take 50 mg by mouth 3 (three) times daily.         Marland Kitchen lamoTRIgine (LAMICTAL) 100 MG  tablet   Oral   Take 200 mg by mouth at bedtime.         . mirtazapine (REMERON) 15 MG tablet   Oral   Take 15 mg by mouth at bedtime.         . Multiple Vitamins-Minerals (MULTIVITAMINS THER. W/MINERALS) TABS   Oral   Take 1 tablet by mouth daily.         . Nutritional Supplements (ESTROVEN PO)   Oral   Take 1 tablet by mouth daily.         Marland Kitchen oxcarbazepine (TRILEPTAL) 600 MG tablet   Oral   Take 600 mg by mouth 2 (two) times daily.         . Potassium 99 MG TABS   Oral   Take 1 tablet by mouth 2 (two) times daily.         . risperiDONE (RISPERDAL) 1 MG tablet   Oral   Take 1 mg by mouth at bedtime.         Marland Kitchen oxyCODONE-acetaminophen (PERCOCET/ROXICET) 5-325 MG per tablet   Oral   Take 1 tablet by mouth every 4 (four) hours as needed for pain.   20 tablet   0     BP 146/85  Pulse 85  Temp(Src) 97.7 F (36.5 C)  Resp 20  SpO2 100%  Physical Exam  Constitutional: She appears well-developed and well-nourished.    HENT:  Head: Atraumatic.  Neck: Normal range of motion.  Cardiovascular:  Pulses equal bilaterally  Musculoskeletal: She exhibits tenderness.       Cervical back: She exhibits tenderness. She exhibits no bony tenderness, no swelling, no deformity, no spasm and normal pulse.       Back:  Neurological: She is alert. She has normal strength. She displays normal reflexes. No sensory deficit.  Reflex Scores:      Tricep reflexes are 2+ on the right side and 2+ on the left side.      Bicep reflexes are 2+ on the right side and 2+ on the left side. Equal grip strength  Skin: Skin is warm and dry.  Psychiatric: She has a normal mood and affect.    ED Course  Procedures (including critical care time)  Labs Reviewed - No data to display Dg Cervical Spine Complete  12/16/2012   *RADIOLOGY REPORT*  Clinical Data: Neck pain  CERVICAL SPINE - COMPLETE 4+ VIEW  Comparison: 02/09/2007  Findings: Anterior plate and screws at C5, C6, and C7 is stable. No breakage or loosening of the hardware.  Straightening of the normally lordotic cervical spine.  Disc narrowing and posterior osteophytes occur C4-5.  Unremarkable prevertebral soft tissues. Patent foramina.  Odontoid is intact.  IMPRESSION: C5-C7 fusion without obvious complication.  No acute bony pathology.   Original Report Authenticated By: Jolaine Click, M.D.     1. Cervical radiculopathy       MDM  Patients labs and/or radiological studies were viewed and considered during the medical decision making and disposition process. Patient was prescribed oxycodone, encouraged heat tx,  F/u with her neurosurgeon if sx persist or worsen.  Cautioned regarding development of weakness and need for immediate recheck if worsens.  No neuro deficit on exam or by history to suggest emergent or surgical presentation.              Burgess Amor, PA-C 12/18/12 [phone]

## 2012-12-18 NOTE — ED Provider Notes (Signed)
Medical screening examination/treatment/procedure(s) were performed by non-physician practitioner and as supervising physician I was immediately available for consultation/collaboration.  Dierra Riesgo, MD 12/18/12 1648 

## 2012-12-20 ENCOUNTER — Ambulatory Visit (HOSPITAL_COMMUNITY)
Admission: RE | Admit: 2012-12-20 | Discharge: 2012-12-20 | Disposition: A | Discharge status: Home / Self Care | Payer: Disability Insurance | Source: Ambulatory Visit | Attending: Emergency Medicine | Admitting: Emergency Medicine

## 2012-12-20 ENCOUNTER — Other Ambulatory Visit (HOSPITAL_COMMUNITY): Payer: Self-pay | Admitting: *Deleted

## 2012-12-20 DIAGNOSIS — M542 Cervicalgia: Secondary | ICD-10-CM

## 2012-12-20 DIAGNOSIS — M549 Dorsalgia, unspecified: Secondary | ICD-10-CM

## 2012-12-20 DIAGNOSIS — M5380 Other specified dorsopathies, site unspecified: Secondary | ICD-10-CM | POA: Insufficient documentation

## 2012-12-20 DIAGNOSIS — M545 Low back pain, unspecified: Secondary | ICD-10-CM | POA: Insufficient documentation

## 2013-02-26 ENCOUNTER — Encounter (HOSPITAL_COMMUNITY): Payer: Self-pay | Admitting: *Deleted

## 2013-02-26 ENCOUNTER — Emergency Department (HOSPITAL_COMMUNITY)
Admission: EM | Admit: 2013-02-26 | Discharge: 2013-02-26 | Disposition: A | Discharge status: Home / Self Care | Attending: Emergency Medicine | Admitting: Emergency Medicine

## 2013-02-26 DIAGNOSIS — F3289 Other specified depressive episodes: Secondary | ICD-10-CM | POA: Insufficient documentation

## 2013-02-26 DIAGNOSIS — W57XXXA Bitten or stung by nonvenomous insect and other nonvenomous arthropods, initial encounter: Secondary | ICD-10-CM | POA: Insufficient documentation

## 2013-02-26 DIAGNOSIS — Z79899 Other long term (current) drug therapy: Secondary | ICD-10-CM | POA: Insufficient documentation

## 2013-02-26 DIAGNOSIS — F410 Panic disorder [episodic paroxysmal anxiety] without agoraphobia: Secondary | ICD-10-CM | POA: Insufficient documentation

## 2013-02-26 DIAGNOSIS — F609 Personality disorder, unspecified: Secondary | ICD-10-CM | POA: Insufficient documentation

## 2013-02-26 DIAGNOSIS — R238 Other skin changes: Secondary | ICD-10-CM | POA: Insufficient documentation

## 2013-02-26 DIAGNOSIS — F17200 Nicotine dependence, unspecified, uncomplicated: Secondary | ICD-10-CM | POA: Insufficient documentation

## 2013-02-26 DIAGNOSIS — F329 Major depressive disorder, single episode, unspecified: Secondary | ICD-10-CM | POA: Insufficient documentation

## 2013-02-26 DIAGNOSIS — R51 Headache: Secondary | ICD-10-CM | POA: Insufficient documentation

## 2013-02-26 DIAGNOSIS — L089 Local infection of the skin and subcutaneous tissue, unspecified: Secondary | ICD-10-CM | POA: Insufficient documentation

## 2013-02-26 DIAGNOSIS — Y9389 Activity, other specified: Secondary | ICD-10-CM | POA: Insufficient documentation

## 2013-02-26 DIAGNOSIS — Y929 Unspecified place or not applicable: Secondary | ICD-10-CM | POA: Insufficient documentation

## 2013-02-26 DIAGNOSIS — Z8742 Personal history of other diseases of the female genital tract: Secondary | ICD-10-CM | POA: Insufficient documentation

## 2013-02-26 DIAGNOSIS — Z8589 Personal history of malignant neoplasm of other organs and systems: Secondary | ICD-10-CM | POA: Insufficient documentation

## 2013-02-26 MED ORDER — HYDROCODONE-ACETAMINOPHEN 5-325 MG PO TABS
1.0000 | ORAL_TABLET | Freq: Once | ORAL | Status: AC
  Administered 2013-02-26: 1 via ORAL
  Filled 2013-02-26: qty 1

## 2013-02-26 MED ORDER — SULFAMETHOXAZOLE-TRIMETHOPRIM 800-160 MG PO TABS: 1.0000 | ORAL_TABLET | Freq: Two times a day (BID) | ORAL | Status: DC

## 2013-02-26 MED ORDER — HYDROCODONE-ACETAMINOPHEN 5-325 MG PO TABS: 1.0000 | ORAL_TABLET | ORAL | Status: DC | PRN

## 2013-02-26 MED ORDER — SULFAMETHOXAZOLE-TMP DS 800-160 MG PO TABS
1.0000 | ORAL_TABLET | Freq: Once | ORAL | Status: AC
  Administered 2013-02-26: 1 via ORAL
  Filled 2013-02-26: qty 1

## 2013-02-26 NOTE — ED Notes (Signed)
?  insect bite/abscess to right knee x 2 days.

## 2013-03-01 ENCOUNTER — Emergency Department (HOSPITAL_COMMUNITY)
Admission: EM | Admit: 2013-03-01 | Discharge: 2013-03-01 | Disposition: A | Discharge status: Home / Self Care | Attending: Emergency Medicine | Admitting: Emergency Medicine

## 2013-03-01 ENCOUNTER — Encounter (HOSPITAL_COMMUNITY): Payer: Self-pay

## 2013-03-01 DIAGNOSIS — L539 Erythematous condition, unspecified: Secondary | ICD-10-CM | POA: Insufficient documentation

## 2013-03-01 DIAGNOSIS — L03115 Cellulitis of right lower limb: Secondary | ICD-10-CM

## 2013-03-01 DIAGNOSIS — M25469 Effusion, unspecified knee: Secondary | ICD-10-CM | POA: Insufficient documentation

## 2013-03-01 DIAGNOSIS — R000 Tachycardia, unspecified: Secondary | ICD-10-CM | POA: Insufficient documentation

## 2013-03-01 DIAGNOSIS — Z859 Personal history of malignant neoplasm, unspecified: Secondary | ICD-10-CM | POA: Insufficient documentation

## 2013-03-01 DIAGNOSIS — F17200 Nicotine dependence, unspecified, uncomplicated: Secondary | ICD-10-CM | POA: Insufficient documentation

## 2013-03-01 DIAGNOSIS — Z8742 Personal history of other diseases of the female genital tract: Secondary | ICD-10-CM | POA: Insufficient documentation

## 2013-03-01 DIAGNOSIS — Z79899 Other long term (current) drug therapy: Secondary | ICD-10-CM | POA: Insufficient documentation

## 2013-03-01 DIAGNOSIS — M25569 Pain in unspecified knee: Secondary | ICD-10-CM | POA: Insufficient documentation

## 2013-03-01 DIAGNOSIS — F319 Bipolar disorder, unspecified: Secondary | ICD-10-CM | POA: Insufficient documentation

## 2013-03-01 DIAGNOSIS — Z8659 Personal history of other mental and behavioral disorders: Secondary | ICD-10-CM | POA: Insufficient documentation

## 2013-03-01 DIAGNOSIS — F410 Panic disorder [episodic paroxysmal anxiety] without agoraphobia: Secondary | ICD-10-CM | POA: Insufficient documentation

## 2013-03-01 DIAGNOSIS — L02419 Cutaneous abscess of limb, unspecified: Secondary | ICD-10-CM | POA: Insufficient documentation

## 2013-03-01 DIAGNOSIS — L03119 Cellulitis of unspecified part of limb: Secondary | ICD-10-CM | POA: Insufficient documentation

## 2013-03-01 DIAGNOSIS — Z9889 Other specified postprocedural states: Secondary | ICD-10-CM | POA: Insufficient documentation

## 2013-03-01 DIAGNOSIS — Z90710 Acquired absence of both cervix and uterus: Secondary | ICD-10-CM | POA: Insufficient documentation

## 2013-03-01 MED ORDER — OXYCODONE-ACETAMINOPHEN 5-325 MG PO TABS: 1.0000 | ORAL_TABLET | Freq: Once | ORAL | Status: AC

## 2013-03-01 MED ORDER — VANCOMYCIN HCL IN DEXTROSE 1-5 GM/200ML-% IV SOLN
1000.0000 mg | Freq: Once | INTRAVENOUS | Status: AC
  Administered 2013-03-01: 1000 mg via INTRAVENOUS
  Filled 2013-03-01: qty 200

## 2013-03-01 MED ORDER — OXYCODONE-ACETAMINOPHEN 5-325 MG PO TABS: 2.0000 | ORAL_TABLET | ORAL | Status: DC | PRN

## 2013-03-01 MED ORDER — CEPHALEXIN 500 MG PO CAPS: 500.0000 mg | ORAL_CAPSULE | Freq: Four times a day (QID) | ORAL | Status: DC

## 2013-03-01 MED ORDER — OXYCODONE-ACETAMINOPHEN 5-325 MG PO TABS
ORAL_TABLET | ORAL | Status: AC
  Administered 2013-03-01: 1 via ORAL
  Filled 2013-03-01: qty 1

## 2013-03-01 MED ORDER — HYDROMORPHONE HCL PF 1 MG/ML IJ SOLN
INTRAMUSCULAR | Status: AC
  Administered 2013-03-01: 1 mg via INTRAVENOUS
  Filled 2013-03-01: qty 1

## 2013-03-01 MED ORDER — HYDROMORPHONE HCL PF 1 MG/ML IJ SOLN
1.0000 mg | Freq: Once | INTRAMUSCULAR | Status: AC
  Administered 2013-03-01: 1 mg via INTRAVENOUS
  Filled 2013-03-01: qty 1

## 2013-03-01 MED ORDER — HYDROMORPHONE HCL PF 1 MG/ML IJ SOLN: 1.0000 mg | Freq: Once | INTRAMUSCULAR | Status: AC

## 2013-03-01 MED ORDER — ONDANSETRON HCL 4 MG/2ML IJ SOLN
4.0000 mg | Freq: Once | INTRAMUSCULAR | Status: AC
  Administered 2013-03-01: 4 mg via INTRAVENOUS
  Filled 2013-03-01: qty 2

## 2013-03-01 NOTE — ED Provider Notes (Signed)
Medical screening examination/treatment/procedure(s) were conducted as a shared visit with non-physician practitioner(s) and myself.  I personally evaluated the patient during the encounter  Presumed insect bite with surrounding cellulitis. No streaking.  FROM knee.  Nontoxic appearing, no evidence of septic joint. IV antibiotics, with recheck in 24-48 hours. PAtient declined observation admission.  Glynn Octave, MD 03/01/13 [phone]

## 2013-03-01 NOTE — ED Notes (Signed)
The patient states that she was evaluated on 02/26/2013 and prescribed pain medication and an antibiotic, states that the area has become larger and more painful since initial evaluation.  The patient requests an injection for the pain that she is experiencing.  The patient is ambulatory and in no acute distress at this point.

## 2013-03-01 NOTE — ED Provider Notes (Signed)
CSN: [id number]     Arrival date & time 02/26/13  2020 History     First MD Initiated Contact with Patient 02/26/13 2059     Chief Complaint  Patient presents with  . Insect Bite   (Consider location/radiation/quality/duration/timing/severity/associated sxs/prior Treatment) HPI Comments: Kara Kelly is a 47 y.o. Female presenting with a presumptive insect bite to her right knee which started 2 days ago and has been progressively painful with increased swelling and redness.  She denies seeing the culprit insect,  Stating she woke with the lesion 2 days ago.  She has had a history of multiple skin infections,  Most recently had an infection 3/14 at the site of a new tattoo applied to her right foot for which she underwent a course of bactrim with complete resolution.  Her pain is constant, throbbing and radiates to her mid lateral lower leg.  She denies fevers or chills, denies myalgias or other flu like symptoms.  She denies rash, headache, nausea or abdominal pain and denies any known tick bites. She has taken no medicines for this problem but has used warm compresses without relief.     The history is provided by the patient.    Past Medical History  Diagnosis Date  . Panic attack   . Depression   . Endometriosis   . Bipolar 1 disorder   . Headache(784.0)   . Cancer   . Personality disorder    Past Surgical History  Procedure Laterality Date  . Appendectomy    . Cholecystectomy    . Abdominal hysterectomy    . Neck surgery     No family history on file. History  Substance Use Topics  . Smoking status: Current Every Day Smoker -- 1.00 packs/day    Types: Cigarettes  . Smokeless tobacco: Not on file  . Alcohol Use: No   OB History   Grav Para Term Preterm Abortions TAB SAB Ect Mult Living                 Review of Systems  Constitutional: Negative for fever and chills.  HENT: Negative for facial swelling.   Respiratory: Negative for shortness of breath  and wheezing.   Skin: Positive for color change and wound.  Neurological: Negative for numbness.    Allergies  Flexeril and Morphine and related  Home Medications   Current Outpatient Rx  Name  Route  Sig  Dispense  Refill  . diazepam (VALIUM) 10 MG tablet   Oral   Take 10 mg by mouth 3 (three) times daily.         Marland Kitchen doxepin (SINEQUAN) 50 MG capsule   Oral   Take 50 mg by mouth at bedtime.         . gabapentin (NEURONTIN) 400 MG capsule   Oral   Take 400 mg by mouth 4 (four) times daily.         . hydrOXYzine (ATARAX/VISTARIL) 50 MG tablet   Oral   Take 50 mg by mouth 3 (three) times daily.         Marland Kitchen lamoTRIgine (LAMICTAL) 100 MG tablet   Oral   Take 200 mg by mouth at bedtime.         . mirtazapine (REMERON) 15 MG tablet   Oral   Take 15 mg by mouth at bedtime.         . Multiple Vitamins-Minerals (MULTIVITAMINS THER. W/MINERALS) TABS   Oral   Take 1 tablet by mouth daily.         Marland Kitchen  Nutritional Supplements (ESTROVEN PO)   Oral   Take 1 tablet by mouth daily.         Marland Kitchen oxcarbazepine (TRILEPTAL) 600 MG tablet   Oral   Take 600 mg by mouth 2 (two) times daily.         . Potassium 99 MG TABS   Oral   Take 1 tablet by mouth 2 (two) times daily.         . risperiDONE (RISPERDAL) 1 MG tablet   Oral   Take 1 mg by mouth at bedtime.         Marland Kitchen albuterol (PROVENTIL HFA;VENTOLIN HFA) 108 (90 BASE) MCG/ACT inhaler   Inhalation   Inhale 1-2 puffs into the lungs every 6 (six) hours as needed for wheezing.   1 Inhaler   1   . HYDROcodone-acetaminophen (NORCO/VICODIN) 5-325 MG per tablet   Oral   Take 1 tablet by mouth every 4 (four) hours as needed for pain.   15 tablet   0   . sulfamethoxazole-trimethoprim (SEPTRA DS) 800-160 MG per tablet   Oral   Take 1 tablet by mouth every 12 (twelve) hours.   20 tablet   0    BP 135/77  Pulse 87  Temp(Src) 98.2 F (36.8 C) (Oral)  Resp 21  Ht 5\' 8"  (1.727 m)  Wt 220 lb (99.791 kg)  BMI  33.46 kg/m2  SpO2 99% Physical Exam  Constitutional: She appears well-developed and well-nourished. No distress.  HENT:  Head: Normocephalic.  Neck: Neck supple.  Cardiovascular: Normal rate.   Pulmonary/Chest: Effort normal. She has no wheezes.  Musculoskeletal: Normal range of motion. She exhibits no edema.  Skin: There is erythema.  Quarter sized slightly raised indurated erythematous lesion right lateral knee.  No fluctuance or red streaking.  Calf is soft to palpation.  No increased pain with knee or ankle flexion.  Pedal pulses full.    ED Course   Procedures (including critical care time)  Informal bedside US performed with no pus pocket identified.  Labs Reviewed - No data to display No results found. 1. Infected insect bite     MDM  Localized infection without sign of abscess,  Possibly localized reaction to insect bite,  But with increased pain and induration, will cover for infection,  Bactrim prescribed,  Hydrocodone for pain relief.  Encouraged warm soaks,  Recheck for any worsened sx.  Burgess Amor, PA-C 03/01/13 [phone]

## 2013-03-01 NOTE — ED Notes (Signed)
Here for recheck of abscess to right knee

## 2013-03-01 NOTE — ED Notes (Signed)
Patient chewed pain medication.

## 2013-03-01 NOTE — ED Provider Notes (Signed)
CSN: [id number]     Arrival date & time 03/01/13  1248 History     First MD Initiated Contact with Patient 03/01/13 1304     Chief Complaint  Patient presents with  . Wound Check   (Consider location/radiation/quality/duration/timing/severity/associated sxs/prior Treatment) The history is provided by the patient.   Kara Kelly is a 47 y.o. female who presents to the ED with increased pain, swelling and redness to the infected area of her right knee. She was here 7/29 and started on antibiotics. Patient has continued to apply warm wet compresses and take the medication. Pain increases with walking and bending knee. She has also noted red streaking since last visit.  Past Medical History  Diagnosis Date  . Panic attack   . Depression   . Endometriosis   . Bipolar 1 disorder   . Headache(784.0)   . Cancer   . Personality disorder    Past Surgical History  Procedure Laterality Date  . Appendectomy    . Cholecystectomy    . Abdominal hysterectomy    . Neck surgery     No family history on file. History  Substance Use Topics  . Smoking status: Current Every Day Smoker -- 1.00 packs/day    Types: Cigarettes  . Smokeless tobacco: Not on file  . Alcohol Use: No   OB History   Grav Para Term Preterm Abortions TAB SAB Ect Mult Living                 Review of Systems  Constitutional: Negative for fever and chills.  HENT: Negative for neck pain.   Respiratory: Negative for shortness of breath.   Gastrointestinal: Negative for nausea, vomiting and abdominal pain.  Musculoskeletal: Gait problem: pain.  Skin: Positive for wound.  Neurological: Negative for headaches.  Psychiatric/Behavioral: The patient is not nervous/anxious.     Allergies  Flexeril and Morphine and related  Home Medications   Current Outpatient Rx  Name  Route  Sig  Dispense  Refill  . albuterol (PROVENTIL HFA;VENTOLIN HFA) 108 (90 BASE) MCG/ACT inhaler   Inhalation   Inhale 1-2 puffs  into the lungs every 6 (six) hours as needed for wheezing.   1 Inhaler   1   . diazepam (VALIUM) 10 MG tablet   Oral   Take 10 mg by mouth 3 (three) times daily.         Marland Kitchen doxepin (SINEQUAN) 50 MG capsule   Oral   Take 50 mg by mouth at bedtime.         . gabapentin (NEURONTIN) 400 MG capsule   Oral   Take 400 mg by mouth 4 (four) times daily.         Marland Kitchen HYDROcodone-acetaminophen (NORCO/VICODIN) 5-325 MG per tablet   Oral   Take 1 tablet by mouth every 4 (four) hours as needed for pain.   15 tablet   0   . hydrOXYzine (ATARAX/VISTARIL) 50 MG tablet   Oral   Take 50 mg by mouth 3 (three) times daily.         Marland Kitchen lamoTRIgine (LAMICTAL) 100 MG tablet   Oral   Take 200 mg by mouth at bedtime.         . mirtazapine (REMERON) 15 MG tablet   Oral   Take 15 mg by mouth at bedtime.         . Multiple Vitamins-Minerals (MULTIVITAMINS THER. W/MINERALS) TABS   Oral   Take 1 tablet by mouth daily.         Marland Kitchen  Nutritional Supplements (ESTROVEN PO)   Oral   Take 1 tablet by mouth daily.         Marland Kitchen oxcarbazepine (TRILEPTAL) 600 MG tablet   Oral   Take 600 mg by mouth 2 (two) times daily.         . Potassium 99 MG TABS   Oral   Take 1 tablet by mouth 2 (two) times daily.         . risperiDONE (RISPERDAL) 1 MG tablet   Oral   Take 1 mg by mouth at bedtime.         . sulfamethoxazole-trimethoprim (SEPTRA DS) 800-160 MG per tablet   Oral   Take 1 tablet by mouth every 12 (twelve) hours.   20 tablet   0    BP 130/73  Pulse 101  Temp(Src) 98.3 F (36.8 C)  Resp 20  Ht 5\' 8"  (1.727 m)  Wt 220 lb (99.791 kg)  BMI 33.46 kg/m2  SpO2 100% Physical Exam  Nursing note and vitals reviewed. Constitutional: She is oriented to person, place, and time. She appears well-developed and well-nourished.  HENT:  Head: Normocephalic.  Eyes: EOM are normal.  Neck: Neck supple.  Cardiovascular: Tachycardia present.   Pulmonary/Chest: Effort normal.  Abdominal:  Soft. There is no tenderness.  Musculoskeletal:       Right knee: She exhibits erythema. She exhibits normal range of motion and no deformity. Swelling: mild. Tenderness found.       Legs: Infected insect bite with cellulitis noted right knee. Pain with flexion of the knee.  Pedal pulse palpated, adequate circulation.  Neurological: She is alert and oriented to person, place, and time. No cranial nerve deficit.  Skin: Skin is warm and dry.  Psychiatric: She has a normal mood and affect. Her behavior is normal.    ED Course: Dr. Manus Gunning in to examine the patient. Offered admission or IV antibiotics now and recheck tomorrow. Patient states she will return for recheck tomorrow.  Will give IV Vancomycin and pain medication now and have patient return tomorrow for recheck.    Procedures  MDM  47 y.o. female with cellulitis right knee. Vancomycin 1 gram IV, Dilaudid 1mg . IV, Zofran 4 mg. IV. Will add Keflex to the Bactrim patient is currently taking and recheck tomorrow.    Medication List    TAKE these medications       cephALEXin 500 MG capsule  Commonly known as:  KEFLEX  Take 1 capsule (500 mg total) by mouth 4 (four) times daily.     oxyCODONE-acetaminophen 5-325 MG per tablet  Commonly known as:  PERCOCET/ROXICET  Take 2 tablets by mouth every 4 (four) hours as needed for pain.      ASK your doctor about these medications       albuterol 108 (90 BASE) MCG/ACT inhaler  Commonly known as:  PROVENTIL HFA;VENTOLIN HFA  Inhale 1-2 puffs into the lungs every 6 (six) hours as needed for wheezing.     diazepam 10 MG tablet  Commonly known as:  VALIUM  Take 10 mg by mouth 3 (three) times daily.     doxepin 50 MG capsule  Commonly known as:  SINEQUAN  Take 50 mg by mouth at bedtime.     ESTROVEN PO  Take 1 tablet by mouth daily.     gabapentin 400 MG capsule  Commonly known as:  NEURONTIN  Take 400 mg by mouth 4 (four) times daily.     HYDROcodone-acetaminophen 5-325 MG per  tablet  Commonly known as:  NORCO/VICODIN  Take 1 tablet by mouth every 4 (four) hours as needed for pain.     hydrOXYzine 50 MG tablet  Commonly known as:  ATARAX/VISTARIL  Take 50 mg by mouth 3 (three) times daily.     lamoTRIgine 100 MG tablet  Commonly known as:  LAMICTAL  Take 200 mg by mouth at bedtime.     mirtazapine 15 MG tablet  Commonly known as:  REMERON  Take 15 mg by mouth at bedtime.     multivitamins ther. w/minerals Tabs  Take 1 tablet by mouth daily.     oxcarbazepine 600 MG tablet  Commonly known as:  TRILEPTAL  Take 600 mg by mouth 2 (two) times daily.     Potassium 99 MG Tabs  Take 1 tablet by mouth 2 (two) times daily.     risperiDONE 1 MG tablet  Commonly known as:  RISPERDAL  Take 1 mg by mouth at bedtime.     sulfamethoxazole-trimethoprim 800-160 MG per tablet  Commonly known as:  SEPTRA DS  Take 1 tablet by mouth every 12 (twelve) hours.         [facility] Orlene Och, [place] 03/01/13 [phone]

## 2013-03-02 ENCOUNTER — Encounter (HOSPITAL_COMMUNITY): Payer: Self-pay | Admitting: Emergency Medicine

## 2013-03-02 ENCOUNTER — Emergency Department (HOSPITAL_COMMUNITY)
Admission: EM | Admit: 2013-03-02 | Discharge: 2013-03-02 | Disposition: A | Discharge status: Home / Self Care | Attending: Emergency Medicine | Admitting: Emergency Medicine

## 2013-03-02 DIAGNOSIS — Z9889 Other specified postprocedural states: Secondary | ICD-10-CM | POA: Insufficient documentation

## 2013-03-02 DIAGNOSIS — L02419 Cutaneous abscess of limb, unspecified: Secondary | ICD-10-CM | POA: Insufficient documentation

## 2013-03-02 DIAGNOSIS — L03119 Cellulitis of unspecified part of limb: Secondary | ICD-10-CM | POA: Insufficient documentation

## 2013-03-02 DIAGNOSIS — Z79899 Other long term (current) drug therapy: Secondary | ICD-10-CM | POA: Insufficient documentation

## 2013-03-02 DIAGNOSIS — Z859 Personal history of malignant neoplasm, unspecified: Secondary | ICD-10-CM | POA: Insufficient documentation

## 2013-03-02 DIAGNOSIS — Z8659 Personal history of other mental and behavioral disorders: Secondary | ICD-10-CM | POA: Insufficient documentation

## 2013-03-02 DIAGNOSIS — F410 Panic disorder [episodic paroxysmal anxiety] without agoraphobia: Secondary | ICD-10-CM | POA: Insufficient documentation

## 2013-03-02 DIAGNOSIS — F17200 Nicotine dependence, unspecified, uncomplicated: Secondary | ICD-10-CM | POA: Insufficient documentation

## 2013-03-02 DIAGNOSIS — Z8742 Personal history of other diseases of the female genital tract: Secondary | ICD-10-CM | POA: Insufficient documentation

## 2013-03-02 DIAGNOSIS — F319 Bipolar disorder, unspecified: Secondary | ICD-10-CM | POA: Insufficient documentation

## 2013-03-02 MED ORDER — BUPIVACAINE HCL (PF) 0.25 % IJ SOLN
INTRAMUSCULAR | Status: AC
  Administered 2013-03-02: 15:00:00
  Filled 2013-03-02: qty 30

## 2013-03-02 MED ORDER — LIDOCAINE HCL (PF) 1 % IJ SOLN
INTRAMUSCULAR | Status: AC
  Administered 2013-03-02: 14:00:00
  Filled 2013-03-02: qty 5

## 2013-03-02 MED ORDER — OXYCODONE-ACETAMINOPHEN 5-325 MG PO TABS: 1.0000 | ORAL_TABLET | Freq: Once | ORAL | Status: AC

## 2013-03-02 MED ORDER — OXYCODONE-ACETAMINOPHEN 5-325 MG PO TABS
1.0000 | ORAL_TABLET | Freq: Once | ORAL | Status: AC
  Administered 2013-03-02: 1 via ORAL
  Filled 2013-03-02: qty 1

## 2013-03-02 MED ORDER — OXYCODONE-ACETAMINOPHEN 5-325 MG PO TABS
ORAL_TABLET | ORAL | Status: AC
  Administered 2013-03-02: 1 via ORAL
  Filled 2013-03-02: qty 1

## 2013-03-02 NOTE — ED Notes (Signed)
Pt states that she is here for recheck of right knee abscess, was seen in er yesterday given iv vancomycin, was advised to return to recheck today, pt states that the redness has decreased but still continues to have pain.

## 2013-03-02 NOTE — ED Notes (Cosign Needed)
INCISION AND DRAINAGE OF ABSCESS RIGHT LOWER EXTREMITY. A patient identified by arm band. Permission for the procedure is given by the patient. Procedural time out taken before incision and drainage of an abscess of the right lower extremity. The procedure was explained to the patient in terms which she understood.  The abscess area of the right lower extremity was first painted in chlorhexidine. The area was infiltrated with 1% plain lidocaine. There was failure of good anesthetic. The area was then infiltrated with 0.25 bupivacaine. After good anesthetic effect the patient was painted with Betadine. Draped in the usual sterile fashion. Incision and drainage was carried out using an 11 blade scalpel. Moderate amount of blood and pus like material was drained. A culture was obtained and sent to the lab. The area was irrigated with saline and inspected. With significant improvement in the abscess site. The patient received a sterile voltage dressing. Patient tolerated the procedure without major problem or complication.  Kathie Dike, PA-C 03/02/13 [phone]

## 2013-03-02 NOTE — ED Notes (Signed)
Pt seen in ED yesterday for right knee abscess. Pt states she was told to return to ED today for another dose of IV antibiotics. Pt also c/o continued pain. Pt states she was given po pain prescription and po antibiotics. Pt states she took 2 oxycodone at 7am today.

## 2013-03-02 NOTE — ED Provider Notes (Signed)
CSN: [id number]     Arrival date & time 03/02/13  1247 History    This chart was scribed for Loren Racer, MD,  by Ashley Jacobs, ED Scribe. The patient was seen in room APA16A/APA16A and the patient's care was started at 1:10 PM     Chief Complaint  Patient presents with  . Abscess    HPI HPI Comments: Kara Kelly is a 47 y.o. female who presents to the Emergency Department complaining of a right knee abscess the presents with moderate pain that present one day PTA.  Pt also visited the ED yesterday and received an IV of antibiotics for L knees cellulitis. Was told to return in 24 hrs for re-evaluation. Decreased redness though now has central swelling and purulent discharge. Pt reports that she has taken 2 oxycodone several hours PTA. Pt denies any nausea, fever, diarrhea, and vomiting.   Past Medical History  Diagnosis Date  . Panic attack   . Depression   . Endometriosis   . Bipolar 1 disorder   . Headache(784.0)   . Cancer   . Personality disorder    Past Surgical History  Procedure Laterality Date  . Appendectomy    . Cholecystectomy    . Abdominal hysterectomy    . Neck surgery     No family history on file. History  Substance Use Topics  . Smoking status: Current Every Day Smoker -- 1.00 packs/day    Types: Cigarettes  . Smokeless tobacco: Not on file  . Alcohol Use: No   OB History   Grav Para Term Preterm Abortions TAB SAB Ect Mult Living                 Review of Systems  Constitutional: Negative for fever, chills and fatigue.  Gastrointestinal: Negative for nausea, vomiting and diarrhea.  Musculoskeletal: Negative for myalgias and arthralgias.  Skin: Positive for wound (right knee abscess).  All other systems reviewed and are negative.    Allergies  Flexeril and Morphine and related  Home Medications   Current Outpatient Rx  Name  Route  Sig  Dispense  Refill  . albuterol (PROVENTIL HFA;VENTOLIN HFA) 108 (90 BASE) MCG/ACT  inhaler   Inhalation   Inhale 1-2 puffs into the lungs every 6 (six) hours as needed for wheezing.   1 Inhaler   1   . cephALEXin (KEFLEX) 500 MG capsule   Oral   Take 1 capsule (500 mg total) by mouth 4 (four) times daily.   20 capsule   0   . diazepam (VALIUM) 10 MG tablet   Oral   Take 10 mg by mouth 3 (three) times daily.         Marland Kitchen doxepin (SINEQUAN) 50 MG capsule   Oral   Take 50 mg by mouth at bedtime.         . gabapentin (NEURONTIN) 400 MG capsule   Oral   Take 400 mg by mouth 4 (four) times daily.         Marland Kitchen HYDROcodone-acetaminophen (NORCO/VICODIN) 5-325 MG per tablet   Oral   Take 1 tablet by mouth every 4 (four) hours as needed for pain.   15 tablet   0   . hydrOXYzine (ATARAX/VISTARIL) 50 MG tablet   Oral   Take 50 mg by mouth 3 (three) times daily.         Marland Kitchen lamoTRIgine (LAMICTAL) 100 MG tablet   Oral   Take 200 mg by mouth at bedtime.         Marland Kitchen  mirtazapine (REMERON) 15 MG tablet   Oral   Take 15 mg by mouth at bedtime.         . Multiple Vitamins-Minerals (MULTIVITAMINS THER. W/MINERALS) TABS   Oral   Take 1 tablet by mouth daily.         . Nutritional Supplements (ESTROVEN PO)   Oral   Take 1 tablet by mouth daily.         Marland Kitchen oxcarbazepine (TRILEPTAL) 600 MG tablet   Oral   Take 600 mg by mouth 2 (two) times daily.         Marland Kitchen oxyCODONE-acetaminophen (PERCOCET/ROXICET) 5-325 MG per tablet   Oral   Take 2 tablets by mouth every 4 (four) hours as needed for pain.   15 tablet   0   . Potassium 99 MG TABS   Oral   Take 1 tablet by mouth 2 (two) times daily.         . risperiDONE (RISPERDAL) 1 MG tablet   Oral   Take 1 mg by mouth at bedtime.         . sulfamethoxazole-trimethoprim (SEPTRA DS) 800-160 MG per tablet   Oral   Take 1 tablet by mouth every 12 (twelve) hours.   20 tablet   0    BP 124/76  Pulse 92  Temp(Src) 98.3 F (36.8 C) (Oral)  Resp 20  Ht 5\' 8"  (1.727 m)  Wt 220 lb (99.791 kg)  BMI 33.46  kg/m2  SpO2 97% Physical Exam  Nursing note and vitals reviewed. Constitutional: She is oriented to person, place, and time. She appears well-developed and well-nourished. She appears distressed.  HENT:  Head: Normocephalic and atraumatic.  Mouth/Throat: No oropharyngeal exudate.  Eyes: Conjunctivae and EOM are normal. Pupils are equal, round, and reactive to light. Right eye exhibits no discharge. Left eye exhibits no discharge.  Neck: Normal range of motion. Neck supple.  Cardiovascular: Normal rate.   Pulmonary/Chest: Effort normal. No respiratory distress.  Abdominal: Soft.  Musculoskeletal: Normal range of motion. She exhibits tenderness. She exhibits no edema.  Neurological: She is alert and oriented to person, place, and time. She has normal reflexes.  Skin: Skin is warm. No rash noted. She is not diaphoretic. There is erythema.  Decreased redness to anterior left when compared to prior ink demarcation. Central area of induration and purulent d/c. ROM of knee. TTP over abscess.   Psychiatric: She has a normal mood and affect.    ED Course  DIAGNOSTIC STUDIES: Oxygen Saturation is 97% on room air, normal by my interpretation.    COORDINATION OF CARE: 1:14 PM Discussed course of care with pt which includes incision and drainage. Pt understands and agrees.   Procedures (including critical care time)  Labs Reviewed - No data to display No results found. No diagnosis found.  MDM  I personally performed the services described in this documentation, which was scribed in my presence. The recorded information has been reviewed and is accurate.  Discussed with pt that repeat IV abx is not needed and that she should continue taking antibiotics as previously prescribed. Will I&D abscess in ED and d/c home.  Return precautions given. Advised to return in 2 days to re-check.   Loren Racer, MD 03/02/13 [phone]

## 2013-03-03 ENCOUNTER — Encounter (HOSPITAL_COMMUNITY): Payer: Self-pay

## 2013-03-03 ENCOUNTER — Emergency Department (HOSPITAL_COMMUNITY)
Admission: EM | Admit: 2013-03-03 | Discharge: 2013-03-03 | Disposition: A | Discharge status: Home / Self Care | Attending: Emergency Medicine | Admitting: Emergency Medicine

## 2013-03-03 DIAGNOSIS — Z79899 Other long term (current) drug therapy: Secondary | ICD-10-CM | POA: Insufficient documentation

## 2013-03-03 DIAGNOSIS — Z859 Personal history of malignant neoplasm, unspecified: Secondary | ICD-10-CM | POA: Insufficient documentation

## 2013-03-03 DIAGNOSIS — L03115 Cellulitis of right lower limb: Secondary | ICD-10-CM

## 2013-03-03 DIAGNOSIS — F17200 Nicotine dependence, unspecified, uncomplicated: Secondary | ICD-10-CM | POA: Insufficient documentation

## 2013-03-03 DIAGNOSIS — L02419 Cutaneous abscess of limb, unspecified: Secondary | ICD-10-CM | POA: Insufficient documentation

## 2013-03-03 DIAGNOSIS — F319 Bipolar disorder, unspecified: Secondary | ICD-10-CM | POA: Insufficient documentation

## 2013-03-03 DIAGNOSIS — Z8659 Personal history of other mental and behavioral disorders: Secondary | ICD-10-CM | POA: Insufficient documentation

## 2013-03-03 DIAGNOSIS — Z8742 Personal history of other diseases of the female genital tract: Secondary | ICD-10-CM | POA: Insufficient documentation

## 2013-03-03 LAB — CBC WITH DIFFERENTIAL/PLATELET
Basophils Absolute: 0 10*3/uL (ref 0.0–0.1)
Basophils Relative: 0 % (ref 0–1)
Eosinophils Absolute: 0.2 10*3/uL (ref 0.0–0.7)
Eosinophils Relative: 2 % (ref 0–5)
Lymphocytes Relative: 36 % (ref 12–46)
MCHC: 35.3 g/dL (ref 30.0–36.0)
MCV: 89.6 fL (ref 78.0–100.0)
Platelets: 278 10*3/uL (ref 150–400)
RDW: 14.6 % (ref 11.5–15.5)
WBC: 10.8 10*3/uL — ABNORMAL HIGH (ref 4.0–10.5)

## 2013-03-03 MED ORDER — HYDROMORPHONE HCL PF 1 MG/ML IJ SOLN
1.0000 mg | Freq: Once | INTRAMUSCULAR | Status: AC
  Administered 2013-03-03: 1 mg via INTRAMUSCULAR
  Filled 2013-03-03: qty 1

## 2013-03-03 NOTE — ED Notes (Signed)
Pt reports she felt something bite her on her Right lateral knee 1 week ago, pt has been seen at [facility] 3 different times during this week. Pt states she was dx with an insect bite, cellulitis, and an abscess. Pt reports she has been taking 2 different kinds of ABX and received ABX IV during one of her ED visits. Pt presents today with increase pain, redness, and swelling to Right knee, pt had an I/D completed yesterday at [facility]

## 2013-03-03 NOTE — ED Provider Notes (Signed)
CSN: [id number]     Arrival date & time 03/03/13  1527 History     First MD Initiated Contact with Patient 03/03/13 1543     Chief Complaint  Patient presents with  . Abscess   (Consider location/radiation/quality/duration/timing/severity/associated sxs/prior Treatment) HPI  Patient is a 47 yo F PMHx significant for Panic Attack, Depression, Bipolar 1 disorder presenting to the ED for continued erythema and pain to R knee over the last week. Patient was initially evaluated on 02/26/13 and given Keflex as an outpatient for cellulitic infection of R knee from possible insect bite. Patient was seen back again in ED two days later w/ increased pain and erythema and had an abscess drained and she was given IV Vancomycin. Pt at that time declined overnight observation. Patient was seen yesterday where she had her abscess re-drained and was told to continue to Keflex at home. Patient states despite Abx the erythema and pain has not improved despite antibiotic use and pain medication. She rates her pain 9/10, with ambulating and palpating as aggravating factors. No alleviating factors. Denies fevers or chills.    Past Medical History  Diagnosis Date  . Panic attack   . Depression   . Endometriosis   . Bipolar 1 disorder   . Headache(784.0)   . Cancer   . Personality disorder    Past Surgical History  Procedure Laterality Date  . Appendectomy    . Cholecystectomy    . Abdominal hysterectomy    . Neck surgery     No family history on file. History  Substance Use Topics  . Smoking status: Current Every Day Smoker -- 1.00 packs/day    Types: Cigarettes  . Smokeless tobacco: Not on file  . Alcohol Use: No   OB History   Grav Para Term Preterm Abortions TAB SAB Ect Mult Living                 Review of Systems  Constitutional: Negative for fever and chills.  HENT: Negative.   Eyes: Negative.   Respiratory: Negative.   Cardiovascular: Negative.   Gastrointestinal: Negative.     Genitourinary: Negative.   Musculoskeletal:       Gait difficulty d/t pain  Skin: Positive for color change and wound.  Neurological: Negative.     Allergies  Flexeril and Morphine and related  Home Medications   Current Outpatient Rx  Name  Route  Sig  Dispense  Refill  . cephALEXin (KEFLEX) 500 MG capsule   Oral   Take 1 capsule (500 mg total) by mouth 4 (four) times daily.   20 capsule   0   . diazepam (VALIUM) 10 MG tablet   Oral   Take 10 mg by mouth 3 (three) times daily.         Marland Kitchen doxepin (SINEQUAN) 50 MG capsule   Oral   Take 50 mg by mouth at bedtime.         . gabapentin (NEURONTIN) 400 MG capsule   Oral   Take 400 mg by mouth 4 (four) times daily.         Marland Kitchen HYDROcodone-acetaminophen (NORCO/VICODIN) 5-325 MG per tablet   Oral   Take 1 tablet by mouth every 4 (four) hours as needed for pain.   15 tablet   0   . hydrOXYzine (ATARAX/VISTARIL) 50 MG tablet   Oral   Take 50 mg by mouth 3 (three) times daily.         Marland Kitchen lamoTRIgine (LAMICTAL)  100 MG tablet   Oral   Take 200 mg by mouth at bedtime.         . mirtazapine (REMERON) 15 MG tablet   Oral   Take 15 mg by mouth at bedtime.         . Multiple Vitamins-Minerals (MULTIVITAMINS THER. W/MINERALS) TABS   Oral   Take 1 tablet by mouth daily.         . Nutritional Supplements (ESTROVEN PO)   Oral   Take 1 tablet by mouth daily.         Marland Kitchen oxcarbazepine (TRILEPTAL) 600 MG tablet   Oral   Take 600 mg by mouth 2 (two) times daily.         Marland Kitchen oxyCODONE-acetaminophen (PERCOCET/ROXICET) 5-325 MG per tablet   Oral   Take 2 tablets by mouth every 4 (four) hours as needed for pain.   15 tablet   0   . Potassium 99 MG TABS   Oral   Take 1 tablet by mouth 2 (two) times daily.         . risperiDONE (RISPERDAL) 1 MG tablet   Oral   Take 1 mg by mouth at bedtime.         . sulfamethoxazole-trimethoprim (SEPTRA DS) 800-160 MG per tablet   Oral   Take 1 tablet by mouth every 12  (twelve) hours.   20 tablet   0   . albuterol (PROVENTIL HFA;VENTOLIN HFA) 108 (90 BASE) MCG/ACT inhaler   Inhalation   Inhale 1-2 puffs into the lungs every 6 (six) hours as needed for wheezing.   1 Inhaler   1    BP 119/59  Pulse 92  Temp(Src) 98.2 F (36.8 C) (Oral)  Resp 18  Ht 5\' 8"  (1.727 m)  Wt 220 lb (99.791 kg)  BMI 33.46 kg/m2  SpO2 94% Physical Exam  Constitutional: She is oriented to person, place, and time. She appears well-developed and well-nourished.  HENT:  Head: Normocephalic and atraumatic.  Eyes: Conjunctivae are normal.  Neck: Neck supple.  Cardiovascular: Normal rate, regular rhythm and normal heart sounds.   Pulmonary/Chest: Effort normal and breath sounds normal.  Abdominal: Soft.  Musculoskeletal:       Right knee: She exhibits erythema. Tenderness found.       Legs: 2 cm appropriately heeling incision w/ 5 cm surrounding erythema. Erythema has decreased from marked line from previous ED visit.    Neurological: She is alert and oriented to person, place, and time.  Skin: Skin is warm and dry.    ED Course   Procedures (including critical care time)  Labs Reviewed  CBC WITH DIFFERENTIAL - Abnormal; Notable for the following:    WBC 10.8 (*)    All other components within normal limits   No results found. 1. Cellulitis of right lower leg     MDM  Improved cellulitis from last ED visit based on decreased area of involvement from marked line, no systemic signs of illness (eg, fever, chills, dehydration, altered mental status, tachypnea, tachycardia, hypotension), no risk factors for serious illness (eg, extremes of age, general debility, immunocompromised status). PE reveals redness, swelling, mildly tender, warm to touch. Skin intact, No bleeding. No bullae. Non purulent. Non circumferential.  Borders are not elevated or sharply demarcated. Line is already around the area of original infection. Pt was instructed to return to the ED if area  surpasses the boarder or pain intensifies. Advised to finish Bactrim prescription, direct pt to apply  warm compresses and to return to ED for I&D if pain should increase or abscess should develop. Patient is agreeable to plan. Patient d/w with Dr. Radford Pax, agrees with plan. Patient is stable at time of discharge      Jeannetta Ellis, PA-C 03/03/13 1828

## 2013-03-03 NOTE — ED Provider Notes (Signed)
Medical screening examination/treatment/procedure(s) were performed by non-physician practitioner and as supervising physician I was immediately available for consultation/collaboration.    Nelia Shi, MD 03/03/13 [phone]

## 2013-03-05 ENCOUNTER — Telehealth (HOSPITAL_COMMUNITY): Payer: Self-pay | Admitting: Emergency Medicine

## 2013-03-05 LAB — CULTURE, ROUTINE-ABSCESS

## 2013-03-05 NOTE — ED Provider Notes (Signed)
Medical screening examination/treatment/procedure(s) were performed by non-physician practitioner and as supervising physician I was immediately available for consultation/collaboration.   Ernesta Trabert M Jrake Rodriquez, DO 03/05/13 1224 

## 2013-03-05 NOTE — ED Notes (Signed)
Results called from [facility].  Abscess Rt lower leg -> (+) mod. MRSA.  7/29 Rx given in ED for Sulfa-Trimeth -> sensitive to the same.  8/1 Rx given in ED for Keflex -> No sens. for this drug provided.  Call and notify pt.  General FYI set for MRSA Hx

## 2013-03-06 NOTE — Progress Notes (Signed)
  ED Antimicrobial Stewardship Positive Culture Follow Up   Kara Kelly is an 47 y.o. female who presented to [facility] on 03/03/2013 with a chief complaint of insect bite/abscess.  Chief Complaint  Patient presents with  . Abscess    Recent Results (from the past 720 hour(s))  CULTURE, ROUTINE-ABSCESS     Status: None   Collection Time    03/02/13  2:52 PM      Result Value Range Status   Specimen Description ABSCESS RIGHT LOWER LEG   Final   Special Requests NONE   Final   Gram Stain     Final   Value: MODERATE WBC PRESENT, PREDOMINANTLY PMN     NO SQUAMOUS EPITHELIAL CELLS SEEN     FEW GRAM POSITIVE COCCI IN CLUSTERS     Performed at [facility]   Culture     Final   Value: MODERATE METHICILLIN RESISTANT STAPHYLOCOCCUS AUREUS     Note: RIFAMPIN AND GENTAMICIN SHOULD NOT BE USED AS SINGLE DRUGS FOR TREATMENT OF STAPH INFECTIONS. This organism DOES NOT demonstrate inducible Clindamycin resistance in vitro. CRITICAL RESULT CALLED TO, READ BACK BY AND VERIFIED WITH: PATTY C 8/5 @1000       BY REAMM     Performed at [facility]   Report Status 03/05/2013 FINAL   Final   Organism ID, Bacteria METHICILLIN RESISTANT STAPHYLOCOCCUS AUREUS   Final    Patient seen in the ED multiple times (7/29, 8/1, 8/2, 8/3) for insect bite/cellulitis. Per 8/3 notes, cellulitis had improved and erythema had decreased from original marked area. Wound culture has now grown MRSA sensitive to Bactrim. On 8/3, patient was instructed to complete Bactrim prescription and return to ED if pain should increase or abscess should develop. No additional treatment indicated at this point.   Cleon Dew 03/06/2013, 9:52 AM Infectious Diseases Pharmacist Phone# [phone]

## 2013-03-06 NOTE — ED Notes (Signed)
Adequately treated-per Emily West 

## 2013-03-10 ENCOUNTER — Telehealth (HOSPITAL_COMMUNITY): Payer: Self-pay | Admitting: Emergency Medicine

## 2013-03-13 NOTE — ED Notes (Signed)
Patient informed of positive results and educated to MRSA precautions. 

## 2013-04-04 ENCOUNTER — Emergency Department (HOSPITAL_COMMUNITY)

## 2013-04-04 ENCOUNTER — Emergency Department (HOSPITAL_COMMUNITY)
Admission: EM | Admit: 2013-04-04 | Discharge: 2013-04-05 | Disposition: A | Discharge status: Home / Self Care | Attending: Emergency Medicine | Admitting: Emergency Medicine

## 2013-04-04 ENCOUNTER — Other Ambulatory Visit: Payer: Self-pay

## 2013-04-04 ENCOUNTER — Encounter (HOSPITAL_COMMUNITY): Payer: Self-pay | Admitting: *Deleted

## 2013-04-04 DIAGNOSIS — R0789 Other chest pain: Secondary | ICD-10-CM | POA: Insufficient documentation

## 2013-04-04 DIAGNOSIS — Z8589 Personal history of malignant neoplasm of other organs and systems: Secondary | ICD-10-CM | POA: Insufficient documentation

## 2013-04-04 DIAGNOSIS — Z8742 Personal history of other diseases of the female genital tract: Secondary | ICD-10-CM | POA: Insufficient documentation

## 2013-04-04 DIAGNOSIS — F17200 Nicotine dependence, unspecified, uncomplicated: Secondary | ICD-10-CM | POA: Insufficient documentation

## 2013-04-04 DIAGNOSIS — J209 Acute bronchitis, unspecified: Secondary | ICD-10-CM | POA: Insufficient documentation

## 2013-04-04 DIAGNOSIS — F3130 Bipolar disorder, current episode depressed, mild or moderate severity, unspecified: Secondary | ICD-10-CM | POA: Insufficient documentation

## 2013-04-04 DIAGNOSIS — R51 Headache: Secondary | ICD-10-CM | POA: Insufficient documentation

## 2013-04-04 LAB — CBC WITH DIFFERENTIAL/PLATELET
Basophils Absolute: 0 10*3/uL (ref 0.0–0.1)
Basophils Relative: 0 % (ref 0–1)
HCT: 41.4 % (ref 36.0–46.0)
Lymphocytes Relative: 37 % (ref 12–46)
Neutro Abs: 7.6 10*3/uL (ref 1.7–7.7)
Neutrophils Relative %: 55 % (ref 43–77)
Platelets: 219 10*3/uL (ref 150–400)
RDW: 14.3 % (ref 11.5–15.5)
WBC: 13.7 10*3/uL — ABNORMAL HIGH (ref 4.0–10.5)

## 2013-04-04 LAB — COMPREHENSIVE METABOLIC PANEL
ALT: 47 U/L — ABNORMAL HIGH (ref 0–35)
AST: 40 U/L — ABNORMAL HIGH (ref 0–37)
Albumin: 3.4 g/dL — ABNORMAL LOW (ref 3.5–5.2)
CO2: 28 mEq/L (ref 19–32)
Chloride: 103 mEq/L (ref 96–112)
GFR calc non Af Amer: 65 mL/min — ABNORMAL LOW (ref >90)
Potassium: 3.5 mEq/L (ref 3.5–5.1)
Sodium: 139 mEq/L (ref 135–145)
Total Bilirubin: 0.3 mg/dL (ref 0.3–1.2)

## 2013-04-04 MED ORDER — HYDROCODONE-ACETAMINOPHEN 5-325 MG PO TABS
2.0000 | ORAL_TABLET | Freq: Once | ORAL | Status: AC
  Administered 2013-04-04: 2 via ORAL
  Filled 2013-04-04: qty 2

## 2013-04-04 MED ORDER — METHYLPREDNISOLONE SODIUM SUCC 125 MG IJ SOLR
125.0000 mg | Freq: Once | INTRAMUSCULAR | Status: AC
  Administered 2013-04-04: 125 mg via INTRAVENOUS
  Filled 2013-04-04: qty 2

## 2013-04-04 MED ORDER — HYDROCODONE-ACETAMINOPHEN 5-325 MG PO TABS: 2.0000 | ORAL_TABLET | ORAL | Status: DC | PRN

## 2013-04-04 MED ORDER — ALBUTEROL SULFATE HFA 108 (90 BASE) MCG/ACT IN AERS
2.0000 | INHALATION_SPRAY | RESPIRATORY_TRACT | Status: DC
  Administered 2013-04-04: 2 via RESPIRATORY_TRACT
  Filled 2013-04-04: qty 6.7

## 2013-04-04 MED ORDER — PREDNISONE 10 MG PO TABS: 20.0000 mg | ORAL_TABLET | Freq: Two times a day (BID) | ORAL | Status: DC

## 2013-04-04 MED ORDER — KETOROLAC TROMETHAMINE 30 MG/ML IJ SOLN
30.0000 mg | Freq: Once | INTRAMUSCULAR | Status: AC
  Administered 2013-04-04: 30 mg via INTRAVENOUS
  Filled 2013-04-04: qty 1

## 2013-04-04 MED ORDER — AZITHROMYCIN 250 MG PO TABS: ORAL_TABLET | ORAL | Status: DC

## 2013-04-04 MED ORDER — ALBUTEROL SULFATE (5 MG/ML) 0.5% IN NEBU
5.0000 mg | INHALATION_SOLUTION | Freq: Once | RESPIRATORY_TRACT | Status: AC
  Administered 2013-04-04: 5 mg via RESPIRATORY_TRACT
  Filled 2013-04-04: qty 1

## 2013-04-04 NOTE — ED Provider Notes (Signed)
CSN: [id number]     Arrival date & time 04/04/13  2149 History  This chart was scribed for Geoffery Lyons, MD by Bennett Scrape, ED Scribe. This patient was seen in room APA15/APA15 and the patient's care was started at 10:10 PM.   Chief Complaint  Patient presents with  . Cough    The history is provided by the patient. No language interpreter was used.    HPI Comments: Kara Kelly is a 47 y.o. female who presents to the Emergency Department complaining of a severe cough with chest tightness, HA and SOB which began two days ago. She states that the cough is occuring constantly making it difficult for her to breathe. She reports that the cough is intermittently productive of mucus and the HA is similar to her prior HAs. Pt admitted to using her inhaler twice today with no relief. She denies any fevers. She denies having a h/o cardiac disease. She is a 0.5 ppd smoker.  Past Medical History  Diagnosis Date  . Panic attack   . Depression   . Endometriosis   . Bipolar 1 disorder   . Headache(784.0)   . Personality disorder   . Cancer    Past Surgical History  Procedure Laterality Date  . Appendectomy    . Cholecystectomy    . Abdominal hysterectomy    . Neck surgery     History reviewed. No pertinent family history. History  Substance Use Topics  . Smoking status: Current Every Day Smoker -- 1.00 packs/day    Types: Cigarettes  . Smokeless tobacco: Not on file  . Alcohol Use: No   No OB history provided.  Review of Systems  Constitutional: Negative for fever.  Respiratory: Positive for cough, chest tightness, shortness of breath and wheezing.   Gastrointestinal: Negative for vomiting.  All other systems reviewed and are negative.    Allergies  Flexeril and Morphine and related  Home Medications   Current Outpatient Rx  Name  Route  Sig  Dispense  Refill  . albuterol (PROVENTIL HFA;VENTOLIN HFA) 108 (90 BASE) MCG/ACT inhaler   Inhalation   Inhale 1-2  puffs into the lungs every 6 (six) hours as needed for wheezing.   1 Inhaler   1   . cephALEXin (KEFLEX) 500 MG capsule   Oral   Take 1 capsule (500 mg total) by mouth 4 (four) times daily.   20 capsule   0   . diazepam (VALIUM) 10 MG tablet   Oral   Take 10 mg by mouth 3 (three) times daily.         Marland Kitchen doxepin (SINEQUAN) 50 MG capsule   Oral   Take 50 mg by mouth at bedtime.         . gabapentin (NEURONTIN) 400 MG capsule   Oral   Take 400 mg by mouth 4 (four) times daily.         Marland Kitchen HYDROcodone-acetaminophen (NORCO/VICODIN) 5-325 MG per tablet   Oral   Take 1 tablet by mouth every 4 (four) hours as needed for pain.   15 tablet   0   . hydrOXYzine (ATARAX/VISTARIL) 50 MG tablet   Oral   Take 50 mg by mouth 3 (three) times daily.         Marland Kitchen lamoTRIgine (LAMICTAL) 100 MG tablet   Oral   Take 200 mg by mouth at bedtime.         . mirtazapine (REMERON) 15 MG tablet   Oral  Take 15 mg by mouth at bedtime.         . Multiple Vitamins-Minerals (MULTIVITAMINS THER. W/MINERALS) TABS   Oral   Take 1 tablet by mouth daily.         . Nutritional Supplements (ESTROVEN PO)   Oral   Take 1 tablet by mouth daily.         Marland Kitchen oxcarbazepine (TRILEPTAL) 600 MG tablet   Oral   Take 600 mg by mouth 2 (two) times daily.         Marland Kitchen oxyCODONE-acetaminophen (PERCOCET/ROXICET) 5-325 MG per tablet   Oral   Take 2 tablets by mouth every 4 (four) hours as needed for pain.   15 tablet   0   . Potassium 99 MG TABS   Oral   Take 1 tablet by mouth 2 (two) times daily.         . risperiDONE (RISPERDAL) 1 MG tablet   Oral   Take 1 mg by mouth at bedtime.         . sulfamethoxazole-trimethoprim (SEPTRA DS) 800-160 MG per tablet   Oral   Take 1 tablet by mouth every 12 (twelve) hours.   20 tablet   0    Triage Vitals: BP 124/86  Pulse 106  Temp(Src) 98.2 F (36.8 C) (Oral)  Resp 24  Ht 5\' 8"  (1.727 m)  Wt 225 lb (102.059 kg)  BMI 34.22 kg/m2  SpO2  96%  Physical Exam  Nursing note and vitals reviewed. Constitutional: She is oriented to person, place, and time. She appears well-developed and well-nourished. No distress.  HENT:  Head: Normocephalic and atraumatic.  Eyes: Conjunctivae and EOM are normal.  Neck: Normal range of motion. Neck supple. No tracheal deviation present.  Cardiovascular: Normal rate, regular rhythm and normal heart sounds.   No murmur heard. Pulmonary/Chest: Effort normal. No respiratory distress. She has wheezes. She has no rales.  Bilateral expiatory wheezes are present.   Abdominal: Soft. Bowel sounds are normal. There is no tenderness.  Musculoskeletal: Normal range of motion. She exhibits no edema.  Neurological: She is alert and oriented to person, place, and time. No cranial nerve deficit.  Skin: Skin is warm and dry.  Psychiatric: She has a normal mood and affect. Her behavior is normal.    ED Course  Procedures (including critical care time)  DIAGNOSTIC STUDIES: Oxygen Saturation is 96% on room air, normal by my interpretation.    COORDINATION OF CARE: 10:15 PM-Discussed treatment plan which includes CXR, CBC panel, CMP and troponin with pt at bedside and pt agreed to plan.   Labs Review Labs Reviewed - No data to display Imaging Review No results found.   Date: 04/04/2013  Rate: 90  Rhythm: normal sinus rhythm  QRS Axis: normal  Intervals: normal  ST/T Wave abnormalities: normal  Conduction Disutrbances:none  Narrative Interpretation:   Old EKG Reviewed: none available    MDM  No diagnosis found. Patient presents here with complaints of chest congestion, facial pain, and cough that has been worsening over the past several days. She states that her cough is productive of green sputum, however she denies fevers. She does feel short of breath. Workup today reveals a white count of 13.7, but chest x-ray reveals no acute process.  EKG and troponin was obtained to evaluate for a cardiac  process, however this was unremarkable as well. She currently continues to complain of facial pain when she coughs and is requesting pain medication. She was given 2  hydrocodone. Her lungs show some improvement with the nebulizer treatments she will be discharged to home with steroids, albuterol inhaler, and Zithromax. I suspect she is developing emphysema as she is a habitual smoker. I've advised her to look into smoking cessation techniques with her primary provider  I personally performed the services described in this documentation, which was scribed in my presence. The recorded information has been reviewed and is accurate.      Geoffery Lyons, MD 04/04/13 [phone]

## 2013-04-04 NOTE — ED Notes (Signed)
Cough, headache.chest hurts

## 2013-04-05 MED ORDER — HYDROCODONE-ACETAMINOPHEN 5-325 MG PO TABS: 2.0000 | ORAL_TABLET | ORAL | Status: DC | PRN

## 2013-04-05 NOTE — Progress Notes (Signed)
Patient's inhaler given with spacer, instructed patient on how to use. Patient had no questions.

## 2013-05-05 ENCOUNTER — Other Ambulatory Visit: Payer: Self-pay

## 2013-05-05 ENCOUNTER — Emergency Department (HOSPITAL_COMMUNITY)

## 2013-05-05 ENCOUNTER — Emergency Department (HOSPITAL_COMMUNITY)
Admission: EM | Admit: 2013-05-05 | Discharge: 2013-05-05 | Disposition: A | Discharge status: Home / Self Care | Attending: Emergency Medicine | Admitting: Emergency Medicine

## 2013-05-05 ENCOUNTER — Encounter (HOSPITAL_COMMUNITY): Payer: Self-pay | Admitting: *Deleted

## 2013-05-05 DIAGNOSIS — F17200 Nicotine dependence, unspecified, uncomplicated: Secondary | ICD-10-CM | POA: Insufficient documentation

## 2013-05-05 DIAGNOSIS — R059 Cough, unspecified: Secondary | ICD-10-CM | POA: Insufficient documentation

## 2013-05-05 DIAGNOSIS — Z79899 Other long term (current) drug therapy: Secondary | ICD-10-CM | POA: Insufficient documentation

## 2013-05-05 DIAGNOSIS — Z859 Personal history of malignant neoplasm, unspecified: Secondary | ICD-10-CM | POA: Insufficient documentation

## 2013-05-05 DIAGNOSIS — R071 Chest pain on breathing: Secondary | ICD-10-CM | POA: Insufficient documentation

## 2013-05-05 DIAGNOSIS — Z8742 Personal history of other diseases of the female genital tract: Secondary | ICD-10-CM | POA: Insufficient documentation

## 2013-05-05 DIAGNOSIS — R05 Cough: Secondary | ICD-10-CM | POA: Insufficient documentation

## 2013-05-05 DIAGNOSIS — R0602 Shortness of breath: Secondary | ICD-10-CM | POA: Insufficient documentation

## 2013-05-05 DIAGNOSIS — F319 Bipolar disorder, unspecified: Secondary | ICD-10-CM | POA: Insufficient documentation

## 2013-05-05 DIAGNOSIS — F410 Panic disorder [episodic paroxysmal anxiety] without agoraphobia: Secondary | ICD-10-CM | POA: Insufficient documentation

## 2013-05-05 DIAGNOSIS — R0789 Other chest pain: Secondary | ICD-10-CM

## 2013-05-05 LAB — COMPREHENSIVE METABOLIC PANEL
ALT: 36 U/L — ABNORMAL HIGH (ref 0–35)
CO2: 27 mEq/L (ref 19–32)
Calcium: 9.4 mg/dL (ref 8.4–10.5)
Creatinine, Ser: 0.84 mg/dL (ref 0.50–1.10)
GFR calc Af Amer: >90 mL/min (ref >90)
GFR calc non Af Amer: 83 mL/min — ABNORMAL LOW (ref >90)
Glucose, Bld: 87 mg/dL (ref 70–99)
Sodium: 139 mEq/L (ref 135–145)
Total Protein: 6.3 g/dL (ref 6.0–8.3)

## 2013-05-05 LAB — CBC WITH DIFFERENTIAL/PLATELET
Basophils Absolute: 0 10*3/uL (ref 0.0–0.1)
Eosinophils Absolute: 0.2 10*3/uL (ref 0.0–0.7)
Eosinophils Relative: 2 % (ref 0–5)
HCT: 41.7 % (ref 36.0–46.0)
Lymphocytes Relative: 41 % (ref 12–46)
Lymphs Abs: 3.8 10*3/uL (ref 0.7–4.0)
MCH: 31.1 pg (ref 26.0–34.0)
MCV: 90.1 fL (ref 78.0–100.0)
Monocytes Absolute: 0.5 10*3/uL (ref 0.1–1.0)
Platelets: 226 10*3/uL (ref 150–400)
RDW: 13.4 % (ref 11.5–15.5)

## 2013-05-05 LAB — TROPONIN I: Troponin I: <0.3 ng/mL (ref <0.30)

## 2013-05-05 LAB — D-DIMER, QUANTITATIVE: D-Dimer, Quant: <0.27 ug/mL-FEU (ref 0.00–0.48)

## 2013-05-05 MED ORDER — PREDNISONE 10 MG PO TABS: 20.0000 mg | ORAL_TABLET | Freq: Every day | ORAL | Status: DC

## 2013-05-05 MED ORDER — OXYCODONE-ACETAMINOPHEN 5-325 MG PO TABS: 1.0000 | ORAL_TABLET | Freq: Four times a day (QID) | ORAL | Status: DC | PRN

## 2013-05-05 MED ORDER — OXYCODONE-ACETAMINOPHEN 5-325 MG PO TABS
1.0000 | ORAL_TABLET | Freq: Once | ORAL | Status: AC
  Administered 2013-05-05: 1 via ORAL
  Filled 2013-05-05: qty 1

## 2013-05-05 NOTE — ED Provider Notes (Signed)
CSN: [id number]     Arrival date & time 05/05/13  1058 History  This chart was scribed for Benny Lennert, MD by Dorothey Baseman, ED Scribe. This patient was seen in room APA14/APA14 and the patient's care was started at 2:16 PM.     Chief Complaint  Patient presents with  . Chest Pain   Patient is a 47 y.o. female presenting with chest pain. The history is provided by the patient. No language interpreter was used.  Chest Pain Pain location:  L chest Pain quality: sharp and stabbing   Pain radiates to:  Does not radiate Pain radiates to the back: no   Pain severity:  Severe Duration: 2. Timing:  Constant Progression:  Unchanged Chronicity:  New Relieved by:  Nothing Worsened by:  Coughing and deep breathing Associated symptoms: cough and shortness of breath   Associated symptoms: no abdominal pain, no back pain, no fatigue and no headache   Cough:    Cough characteristics:  Productive   Sputum characteristics:  Green   Severity:  Moderate   Timing:  Constant   Progression:  Unchanged   Chronicity:  Recurrent Risk factors: smoking    HPI Comments: Kara Kelly is a 47 y.o. female who presents to the Emergency Department complaining of constant, sharp, left-sided chest pain, exacerbated with coughing and deep breathing, onset 2 days ago. Patient reports that she has been treated several times in the past few weeks with antibiotics for bronchitis, but that the symptoms persist. She reports associated cough with greenish sputum and shortness of breath.  Past Medical History  Diagnosis Date  . Panic attack   . Depression   . Endometriosis   . Bipolar 1 disorder   . Headache(784.0)   . Personality disorder   . Cancer    Past Surgical History  Procedure Laterality Date  . Appendectomy    . Cholecystectomy    . Abdominal hysterectomy    . Neck surgery     No family history on file. History  Substance Use Topics  . Smoking status: Current Every Day Smoker -- 1.00  packs/day    Types: Cigarettes  . Smokeless tobacco: Not on file  . Alcohol Use: No   OB History   Grav Para Term Preterm Abortions TAB SAB Ect Mult Living                 Review of Systems  Constitutional: Negative for appetite change and fatigue.  HENT: Negative for congestion, sinus pressure and ear discharge.   Eyes: Negative for discharge.  Respiratory: Positive for cough and shortness of breath.   Cardiovascular: Positive for chest pain.  Gastrointestinal: Negative for abdominal pain and diarrhea.  Genitourinary: Negative for frequency and hematuria.  Musculoskeletal: Negative for back pain.  Skin: Negative for rash.  Neurological: Negative for seizures and headaches.  Psychiatric/Behavioral: Negative for hallucinations.    Allergies  Flexeril and Morphine and related  Home Medications   Current Outpatient Rx  Name  Route  Sig  Dispense  Refill  . albuterol (PROVENTIL HFA;VENTOLIN HFA) 108 (90 BASE) MCG/ACT inhaler   Inhalation   Inhale 1-2 puffs into the lungs every 6 (six) hours as needed for wheezing.   1 Inhaler   1   . diazepam (VALIUM) 10 MG tablet   Oral   Take 10 mg by mouth 3 (three) times daily.         Marland Kitchen doxepin (SINEQUAN) 50 MG capsule   Oral  Take 50 mg by mouth at bedtime.         . gabapentin (NEURONTIN) 400 MG capsule   Oral   Take 400 mg by mouth 4 (four) times daily.         . hydrOXYzine (ATARAX/VISTARIL) 50 MG tablet   Oral   Take 50 mg by mouth 3 (three) times daily.         Marland Kitchen lamoTRIgine (LAMICTAL) 100 MG tablet   Oral   Take 200 mg by mouth at bedtime.         . mirtazapine (REMERON) 15 MG tablet   Oral   Take 15 mg by mouth at bedtime.         . Multiple Vitamins-Minerals (MULTIVITAMINS THER. W/MINERALS) TABS   Oral   Take 1 tablet by mouth daily.         Marland Kitchen oxcarbazepine (TRILEPTAL) 600 MG tablet   Oral   Take 600 mg by mouth 2 (two) times daily.         . risperiDONE (RISPERDAL) 1 MG tablet   Oral    Take 1 mg by mouth at bedtime.          Triage Vitals: BP 114/86  Pulse 90  Temp(Src) 98.2 F (36.8 C) (Oral)  Resp 20  Ht 5\' 8"  (1.727 m)  Wt 225 lb (102.059 kg)  BMI 34.22 kg/m2  SpO2 98%  Physical Exam  Constitutional: She is oriented to person, place, and time. She appears well-developed.  HENT:  Head: Normocephalic.  Eyes: Conjunctivae and EOM are normal. No scleral icterus.  Neck: Neck supple. No thyromegaly present.  Cardiovascular: Normal rate, regular rhythm and normal heart sounds.  Exam reveals no gallop and no friction rub.   No murmur heard. Pulmonary/Chest: Effort normal. No stridor. No respiratory distress. She has wheezes. She has no rales. She exhibits no tenderness.  Moderate tenderness to palpation to left anterior chest wall. Mild wheezes bilaterally.   Abdominal: She exhibits no distension. There is no tenderness. There is no rebound.  Musculoskeletal: Normal range of motion. She exhibits no edema.  Lymphadenopathy:    She has no cervical adenopathy.  Neurological: She is oriented to person, place, and time. She exhibits normal muscle tone. Coordination normal.  Skin: No rash noted. No erythema.  Psychiatric: She has a normal mood and affect. Her behavior is normal.    ED Course  Procedures (including critical care time)  Medications  oxyCODONE-acetaminophen (PERCOCET/ROXICET) 5-325 MG per tablet 1 tablet (1 tablet Oral Given 05/05/13 1444)    DIAGNOSTIC STUDIES: Oxygen Saturation is 98% on room air, normal by my interpretation.    COORDINATION OF CARE: 2:19PM- Will order labs, chest x-ray, and Percocet to manage pain symptoms. Discussed treatment plan with patient at bedside and patient verbalized agreement.   3:20PM- Discussed that labs do not indicate a blood clot and that symptoms are likely related to inflammation. Will order Prednisone and pain medications. Discussed treatment plan with patient at bedside and patient verbalized agreement.     Labs Review Labs Reviewed  COMPREHENSIVE METABOLIC PANEL - Abnormal; Notable for the following:    Albumin 3.3 (*)    ALT 36 (*)    Total Bilirubin 0.2 (*)    GFR calc non Af Amer 83 (*)    All other components within normal limits  CBC WITH DIFFERENTIAL  D-DIMER, QUANTITATIVE  TROPONIN I    Imaging Review Dg Chest 2 View  05/05/2013   CLINICAL DATA:  Chest  pain and shortness of breath.  EXAM: CHEST - 2 VIEW  COMPARISON:  04/04/2013  FINDINGS: Scarring/atelectasis remains at the left lung base. There is improved aeration at the right lung base since the prior study. There is no evidence of pulmonary edema, focal airspace consolidation or pleural effusion. No pneumothorax or pulmonary nodule is detected. The heart size and mediastinal contours are within normal limits. The bony thorax is unremarkable.  IMPRESSION: No active disease.  Scarring and atelectasis at the left lung base.   Electronically Signed   By: Irish Lack M.D.   On: 05/05/2013 12:02    Date: 05/05/2013  Rate: 85  Rhythm: normal sinus rhythm  QRS Axis: normal  Intervals: normal  ST/T Wave abnormalities: normal  Conduction Disutrbances:none  Narrative Interpretation:   Old EKG Reviewed: unchanged   MDM  No diagnosis found.   The chart was scribed for me under my direct supervision.  I personally performed the history, physical, and medical decision making and all procedures in the evaluation of this patient.Benny Lennert, MD 05/05/13 [phone]

## 2013-05-05 NOTE — ED Notes (Signed)
Pt c/o left side chest area that started two days ago with sob, denies any n/v, diaphoresis, pt states that if she puts pressure on left side of chest area where the pain is the pressure makes the pain better, admits to cough that is productive with yellow green sputum production, coughing makes the pain worse,

## 2013-05-05 NOTE — ED Notes (Signed)
Pt alert & oriented x4, stable gait. Patient given discharge instructions, paperwork & prescription(s). Patient  instructed to stop at the registration desk to finish any additional paperwork. Patient verbalized understanding. Pt left department w/ no further questions. 

## 2013-05-14 ENCOUNTER — Encounter: Payer: Self-pay | Admitting: Family Medicine

## 2013-05-15 DIAGNOSIS — Z720 Tobacco use: Secondary | ICD-10-CM | POA: Insufficient documentation

## 2013-05-29 ENCOUNTER — Ambulatory Visit (INDEPENDENT_AMBULATORY_CARE_PROVIDER_SITE_OTHER): Admitting: Family Medicine

## 2013-05-29 ENCOUNTER — Encounter: Payer: Self-pay | Admitting: Family Medicine

## 2013-05-29 VITALS — BP 120/80 | HR 82 | Temp 97.4°F | Resp 20 | Ht 70.0 in | Wt 224.0 lb

## 2013-05-29 DIAGNOSIS — G43909 Migraine, unspecified, not intractable, without status migrainosus: Secondary | ICD-10-CM

## 2013-05-29 DIAGNOSIS — G8929 Other chronic pain: Secondary | ICD-10-CM

## 2013-05-29 DIAGNOSIS — F609 Personality disorder, unspecified: Secondary | ICD-10-CM

## 2013-05-29 DIAGNOSIS — J449 Chronic obstructive pulmonary disease, unspecified: Secondary | ICD-10-CM

## 2013-05-29 DIAGNOSIS — F3162 Bipolar disorder, current episode mixed, moderate: Secondary | ICD-10-CM

## 2013-05-29 DIAGNOSIS — M549 Dorsalgia, unspecified: Secondary | ICD-10-CM

## 2013-05-29 MED ORDER — GABAPENTIN 800 MG PO TABS: 800.0000 mg | ORAL_TABLET | Freq: Three times a day (TID) | ORAL | Status: DC

## 2013-05-29 MED ORDER — PROMETHAZINE HCL 25 MG PO TABS: 25.0000 mg | ORAL_TABLET | Freq: Three times a day (TID) | ORAL | Status: DC | PRN

## 2013-05-29 NOTE — Patient Instructions (Addendum)
Change gabapentin to 800mg  three times a day Phenergan for nausea with migraines  Try the spiriva for your lungs  Given shots for migraine- Toradol and Phenergan  Try the Spiriva once a day for your breathing Referral for psychiatry  Continue all other medications Release of records from [facility] Release of records- Dr. Janna Arch Release of records- [facility] F/U 6 weeks

## 2013-05-30 DIAGNOSIS — F32A Depression, unspecified: Secondary | ICD-10-CM | POA: Insufficient documentation

## 2013-05-30 DIAGNOSIS — F329 Major depressive disorder, single episode, unspecified: Secondary | ICD-10-CM | POA: Insufficient documentation

## 2013-05-31 ENCOUNTER — Encounter: Payer: Self-pay | Admitting: Family Medicine

## 2013-05-31 DIAGNOSIS — G43909 Migraine, unspecified, not intractable, without status migrainosus: Secondary | ICD-10-CM | POA: Insufficient documentation

## 2013-05-31 DIAGNOSIS — J449 Chronic obstructive pulmonary disease, unspecified: Secondary | ICD-10-CM | POA: Insufficient documentation

## 2013-05-31 DIAGNOSIS — G8929 Other chronic pain: Secondary | ICD-10-CM | POA: Insufficient documentation

## 2013-05-31 DIAGNOSIS — F3162 Bipolar disorder, current episode mixed, moderate: Secondary | ICD-10-CM | POA: Insufficient documentation

## 2013-05-31 DIAGNOSIS — M549 Dorsalgia, unspecified: Secondary | ICD-10-CM | POA: Insufficient documentation

## 2013-05-31 DIAGNOSIS — F609 Personality disorder, unspecified: Secondary | ICD-10-CM | POA: Insufficient documentation

## 2013-05-31 NOTE — Assessment & Plan Note (Addendum)
She suffers with both depression and anxiety as well as bipolar disorder. I will place a referral to [facility] psychiatric program to evaluate for possible ECT therapy. She still in the care of her current psychiatrist. I will obtain records from their office. She's of no harm to herself or others today.  Approx 45 minutes spent with pt on new pt care, with  > 50%  coordination of care, counseling for bipolar, depression

## 2013-05-31 NOTE — Progress Notes (Signed)
  Subjective:    Patient ID: Kara Kelly, female    DOB: Dec 11, 1965, 47 y.o.   MRN: [id number]  HPI Patient here to establish care. Her previous PCP Dr. Janna Arch. She is followed by [facility]  mental health. Her neurosurgeon is Dr. Mikal Plane Medications and history were reviewed She has an extensive psychiatric history she's currently on 8 medications to control her psychiatric disorder. She's history of bipolar disorder as well as personality disorder major depression. She states today that she was to be admitted to [facility] to have ECT therapy done as her psychiatrist that this might help. She states that she was not given any referral for their psychiatry program.  She was recently admitted to [facility] inpatient psychiatry in February for week. She thinks her family will be upset when her if she goes to a long-term facility to have psychiatric therapy done.  Chronic back pain she has history of chronic back pain she is followed by neurosurgery she is not on any narcotic medications at this time. She did receive some pain medications from the emergency room a couple weeks ago. She states that she is pending an MRI by her neurosurgeon. She takes Valium for her psychiatric illness but also uses for her back as well she takes a half a tablet 5 times a day per report.  Migraine headache-she states that she's been to the ER multiple times for migraine however I looked back and only see one particular incidents for headache. She states that nothing helps her migraines and that they occur almost daily.  She's a history of COPD. She tells that she's been seen by a lung specialist because she has "chronic pneumonia". She was using a lift which was given by her previous PCP as well as albuterol but has not seen much improvement. She is are he had her flu shot. Review of Systems GEN- denies fatigue, fever, weight loss,weakness, recent illness HEENT- denies eye drainage, change in vision, nasal  discharge, CVS- denies chest pain, palpitations RESP- denies SOB, cough, wheeze ABD- denies N/V, change in stools, abd pain GU- denies dysuria, hematuria, dribbling, incontinence MSK- + joint pain, muscle aches, injury Neuro- +headache, dizziness, syncope, seizure activity       Objective:   Physical Exam GEN- NAD, alert and oriented x3 HEENT- PERRL, EOMI, non injected sclera, pink conjunctiva, MMM, oropharynx clear Neck- Supple, fair ROM CVS- RRR, no murmur RESP-few bilateral wheeze, no rhonchi, normal WOB EXT- No edema Pulses- Radial, DP- 2+ Psych- Initially happy, then depressed affect and crying. Fair eye contact, no response to external stimuli, no SI/HI       Assessment & Plan:

## 2013-05-31 NOTE — Assessment & Plan Note (Addendum)
Given Toradol and Phenergan injection in the office. Days on her other medications I do not feel comfortable adding to this. She's on gabapentin which is also known to help her migraines. I will change her dosing to 800mg  TID. I did give her Phenergan to use for nausea. I will need to obtain her records here

## 2013-05-31 NOTE — Assessment & Plan Note (Signed)
Defer to neurosurgery

## 2013-05-31 NOTE — Assessment & Plan Note (Signed)
She was given Spiriva and office. Will have her try this along with her albuterol. Questionable underlying recurrent pneumonia acne concern for inhaled corticosteroids F/U with [facility] pulmonary

## 2013-06-07 ENCOUNTER — Other Ambulatory Visit: Payer: Self-pay | Admitting: Neurosurgery

## 2013-06-07 DIAGNOSIS — M545 Low back pain: Secondary | ICD-10-CM

## 2013-06-07 DIAGNOSIS — M542 Cervicalgia: Secondary | ICD-10-CM

## 2013-06-10 ENCOUNTER — Telehealth: Payer: Self-pay | Admitting: Family Medicine

## 2013-06-10 NOTE — Telephone Encounter (Signed)
Please call her.  Wants to talk with nurse

## 2013-06-11 NOTE — Telephone Encounter (Signed)
LMTRC..06/11/13

## 2013-06-14 NOTE — Telephone Encounter (Signed)
Left message with pt to return my call °

## 2013-06-16 ENCOUNTER — Ambulatory Visit
Admission: RE | Admit: 2013-06-16 | Discharge: 2013-06-16 | Disposition: A | Discharge status: Home / Self Care | Payer: Disability Insurance | Source: Ambulatory Visit | Attending: Neurosurgery | Admitting: Neurosurgery

## 2013-06-16 ENCOUNTER — Ambulatory Visit
Admission: RE | Admit: 2013-06-16 | Discharge: 2013-06-16 | Disposition: A | Discharge status: Home / Self Care | Source: Ambulatory Visit | Attending: Neurosurgery | Admitting: Neurosurgery

## 2013-06-16 DIAGNOSIS — M542 Cervicalgia: Secondary | ICD-10-CM

## 2013-06-16 DIAGNOSIS — M545 Low back pain, unspecified: Secondary | ICD-10-CM

## 2013-06-18 ENCOUNTER — Other Ambulatory Visit

## 2013-06-19 NOTE — Telephone Encounter (Signed)
LMTRC

## 2013-06-23 ENCOUNTER — Ambulatory Visit
Admission: RE | Admit: 2013-06-23 | Discharge: 2013-06-23 | Disposition: A | Discharge status: Home / Self Care | Payer: Disability Insurance | Source: Ambulatory Visit | Attending: Neurosurgery | Admitting: Neurosurgery

## 2013-07-10 ENCOUNTER — Ambulatory Visit (INDEPENDENT_AMBULATORY_CARE_PROVIDER_SITE_OTHER): Admitting: Family Medicine

## 2013-07-10 VITALS — BP 104/70 | HR 100 | Temp 98.6°F | Resp 18 | Wt 199.0 lb

## 2013-07-10 DIAGNOSIS — R1310 Dysphagia, unspecified: Secondary | ICD-10-CM

## 2013-07-10 DIAGNOSIS — M549 Dorsalgia, unspecified: Secondary | ICD-10-CM

## 2013-07-10 DIAGNOSIS — G8929 Other chronic pain: Secondary | ICD-10-CM

## 2013-07-10 DIAGNOSIS — J449 Chronic obstructive pulmonary disease, unspecified: Secondary | ICD-10-CM

## 2013-07-10 DIAGNOSIS — R39198 Other difficulties with micturition: Secondary | ICD-10-CM

## 2013-07-10 DIAGNOSIS — F3162 Bipolar disorder, current episode mixed, moderate: Secondary | ICD-10-CM

## 2013-07-10 DIAGNOSIS — R3989 Other symptoms and signs involving the genitourinary system: Secondary | ICD-10-CM

## 2013-07-10 LAB — URINALYSIS, ROUTINE W REFLEX MICROSCOPIC
Glucose, UA: NEGATIVE mg/dL
Hgb urine dipstick: NEGATIVE
Ketones, ur: NEGATIVE mg/dL
Leukocytes, UA: NEGATIVE
Nitrite: NEGATIVE
Protein, ur: NEGATIVE mg/dL

## 2013-07-10 MED ORDER — HYDROCODONE-ACETAMINOPHEN 7.5-325 MG PO TABS: 1.0000 | ORAL_TABLET | Freq: Four times a day (QID) | ORAL | Status: DC | PRN

## 2013-07-10 NOTE — Patient Instructions (Addendum)
Continue current medications Referral to GI for the swallowing problem Given 30 tablets of pain meds, further medication needs to come from your surgeon Medications refilled Urine test is normal Referral to urologist to have bladder looked at  F/U 3 months

## 2013-07-11 ENCOUNTER — Encounter: Payer: Self-pay | Admitting: Family Medicine

## 2013-07-11 DIAGNOSIS — R1310 Dysphagia, unspecified: Secondary | ICD-10-CM | POA: Insufficient documentation

## 2013-07-11 DIAGNOSIS — R39198 Other difficulties with micturition: Secondary | ICD-10-CM | POA: Insufficient documentation

## 2013-07-11 MED ORDER — TIOTROPIUM BROMIDE MONOHYDRATE 18 MCG IN CAPS: 18.0000 ug | ORAL_CAPSULE | Freq: Every day | RESPIRATORY_TRACT | Status: DC

## 2013-07-11 MED ORDER — ALBUTEROL SULFATE HFA 108 (90 BASE) MCG/ACT IN AERS: 1.0000 | INHALATION_SPRAY | RESPIRATORY_TRACT | Status: DC | PRN

## 2013-07-11 NOTE — Assessment & Plan Note (Signed)
I don't really see any signs of urinary retention but this is what she is describing. She was able to urinate in the office a urinalysis was normal. She is off of many medications that have been causing some of the urinary retention however she's not had any change in her symptoms. She would like to be evaluated by urology. She has had a hysterectomy so she could be evaluated to see if there is a prolapse aware that is causing some obstruction

## 2013-07-11 NOTE — Assessment & Plan Note (Signed)
Referral to gastroenterology for the dysphasia. She's not had any weight loss.

## 2013-07-11 NOTE — Progress Notes (Signed)
   Subjective:    Patient ID: Kara Kelly, female    DOB: [birth year], 47 y.o.   MRN: [id number]  HPI Issue here for interim followup from our initial visit. She is establish with psychiatry at [facility] and is currently getting shock treatments for her depression and bipolar. She states that she is doing well. She was taken off of almost all of her psychiatric medications with the exception of thallium and doxepin. She states she was given a new medication for PTSD but she does not know the name of this. She is here with her husband today she states that she is improved.  Chronic back pain-was seen by neurosurgery back in November they're planning to do nerve conduction studies that she continues to have some neuropathy. She was given pain medication at that time however had to take 1-2 of them and she also has a bone spur in hairline fracture that was picked up by podiatry and she is currently in a walking boot. She asked for refill on her medication until she goes back to see her neurosurgeon on the 18th   COPD-started this per evening her breathing is much better  Difficulty urinating over the past few months. She denies any dysuria. States that she has to sit on the toilet for 20 minutes at a time before she can even start to urinate. She denies any change in her bowel movements and states that they are regular. She's not noticed any change in urination after coming off of most of her psych  Medications. S/p hysterectomy  She's also had recurrence of her dysphasia and has difficulty swallowing her pills and food feels like everything is getting stuck she will like to return to gastroenterology to have her esophagus reevaluate. He was last evaluated about 10 years ago Review of Systems -per above    GEN- denies fatigue, fever, weight loss,weakness, recent illness HEENT- denies eye drainage, change in vision, nasal discharge, CVS- denies chest pain,  palpitations RESP- denies SOB, cough, wheeze ABD- denies N/V, change in stools, abd pain GU- denies dysuria, hematuria, dribbling, incontinence MSK- + joint pain, muscle aches, injury Neuro- denies headache, dizziness, syncope, seizure activity      Objective:   Physical Exam GEN- NAD, alert and oriented x3 HEENT- PERRL, EOMI, non injected sclera, pink conjunctiva, MMM, oropharynx clear Neck- Supple, CVS- RRR, no murmur RESP-CTAB,  ABD-NABS,soft,NT,ND EXT- No edema, Right foot in walking Boot Pulses- Radial 2+ Psych- depressed affect, not overtly anxious, no SI, good eye contact       Assessment & Plan:

## 2013-07-11 NOTE — Assessment & Plan Note (Signed)
She will continue followup with her new psychiatrist in [facility]

## 2013-07-11 NOTE — Assessment & Plan Note (Signed)
I have given her 30 tablets of hydrocodone to last her until her appointment. I did review the narcotics data base and it was consistent with what she told me

## 2013-07-11 NOTE — Assessment & Plan Note (Signed)
Continue Spiriva 

## 2013-07-17 ENCOUNTER — Encounter (INDEPENDENT_AMBULATORY_CARE_PROVIDER_SITE_OTHER): Payer: Self-pay | Admitting: *Deleted

## 2013-07-29 ENCOUNTER — Emergency Department (HOSPITAL_COMMUNITY)
Admission: EM | Admit: 2013-07-29 | Discharge: 2013-07-29 | Disposition: A | Discharge status: Home / Self Care | Attending: Emergency Medicine | Admitting: Emergency Medicine

## 2013-07-29 ENCOUNTER — Encounter (HOSPITAL_COMMUNITY): Payer: Self-pay | Admitting: Emergency Medicine

## 2013-07-29 DIAGNOSIS — J449 Chronic obstructive pulmonary disease, unspecified: Secondary | ICD-10-CM | POA: Insufficient documentation

## 2013-07-29 DIAGNOSIS — Z859 Personal history of malignant neoplasm, unspecified: Secondary | ICD-10-CM | POA: Insufficient documentation

## 2013-07-29 DIAGNOSIS — Z79899 Other long term (current) drug therapy: Secondary | ICD-10-CM | POA: Insufficient documentation

## 2013-07-29 DIAGNOSIS — Z8742 Personal history of other diseases of the female genital tract: Secondary | ICD-10-CM | POA: Insufficient documentation

## 2013-07-29 DIAGNOSIS — F17200 Nicotine dependence, unspecified, uncomplicated: Secondary | ICD-10-CM | POA: Insufficient documentation

## 2013-07-29 DIAGNOSIS — J4489 Other specified chronic obstructive pulmonary disease: Secondary | ICD-10-CM | POA: Insufficient documentation

## 2013-07-29 DIAGNOSIS — Z8611 Personal history of tuberculosis: Secondary | ICD-10-CM | POA: Insufficient documentation

## 2013-07-29 DIAGNOSIS — F410 Panic disorder [episodic paroxysmal anxiety] without agoraphobia: Secondary | ICD-10-CM | POA: Insufficient documentation

## 2013-07-29 DIAGNOSIS — Z9089 Acquired absence of other organs: Secondary | ICD-10-CM | POA: Insufficient documentation

## 2013-07-29 DIAGNOSIS — F319 Bipolar disorder, unspecified: Secondary | ICD-10-CM | POA: Insufficient documentation

## 2013-07-29 DIAGNOSIS — J029 Acute pharyngitis, unspecified: Secondary | ICD-10-CM | POA: Insufficient documentation

## 2013-07-29 DIAGNOSIS — Z8701 Personal history of pneumonia (recurrent): Secondary | ICD-10-CM | POA: Insufficient documentation

## 2013-07-29 LAB — RAPID STREP SCREEN (MED CTR MEBANE ONLY): Streptococcus, Group A Screen (Direct): NEGATIVE

## 2013-07-29 MED ORDER — OXYCODONE-ACETAMINOPHEN 5-325 MG PO TABS
2.0000 | ORAL_TABLET | Freq: Once | ORAL | Status: AC
  Administered 2013-07-29: 2 via ORAL
  Filled 2013-07-29: qty 2

## 2013-07-29 MED ORDER — OXYCODONE-ACETAMINOPHEN 5-325 MG PO TABS: 1.0000 | ORAL_TABLET | ORAL | Status: DC | PRN

## 2013-07-29 MED ORDER — KETOROLAC TROMETHAMINE 60 MG/2ML IM SOLN
60.0000 mg | Freq: Once | INTRAMUSCULAR | Status: AC
  Administered 2013-07-29: 60 mg via INTRAMUSCULAR
  Filled 2013-07-29: qty 2

## 2013-07-29 MED ORDER — MAGIC MOUTHWASH W/LIDOCAINE: 10.0000 mL | Freq: Four times a day (QID) | ORAL | Status: DC | PRN

## 2013-07-29 MED ORDER — LIDOCAINE VISCOUS 2 % MT SOLN
15.0000 mL | Freq: Once | OROMUCOSAL | Status: AC
  Administered 2013-07-29: 15 mL via OROMUCOSAL
  Filled 2013-07-29: qty 15

## 2013-07-29 NOTE — ED Notes (Signed)
Pt with c/o sore throat and fever for 2 days

## 2013-07-31 LAB — CULTURE, GROUP A STREP

## 2013-08-01 NOTE — ED Provider Notes (Signed)
CSN: [id number]     Arrival date & time 07/29/13  1017 History   First MD Initiated Contact with Patient 07/29/13 1200     Chief Complaint  Patient presents with  . Sore Throat   (Consider location/radiation/quality/duration/timing/severity/associated sxs/prior Treatment) HPI Comments: Kara Kelly is a 48 y.o. Female presenting with a 2 day history of uri type symptoms which includes nasal congestion with clear rhinorrhea, sore throat and low grade fever.  She states her pain is "the worst throat pain I have ever had".  She is able to swallow her saliva, but has not had oral intake since yesterday secondary to pain.  She has tried tylenol without relief of pain.  Symptoms due to not include shortness of breath, chest pain,  Nausea, vomiting or diarrhea.  She denies any positive exposures to strep throat.    The history is provided by the patient.    Past Medical History  Diagnosis Date  . Panic attack   . Depression   . Endometriosis   . Bipolar 1 disorder   . Headache(784.0)   . Personality disorder   . Tuberculosis     been tested positive for it  . COPD (chronic obstructive pulmonary disease)   . Pneumonia complicating pregnancy   . Pneumonia     chronic per lung doctor  . Cancer    Past Surgical History  Procedure Laterality Date  . Appendectomy    . Cholecystectomy    . Abdominal hysterectomy    . Neck surgery     History reviewed. No pertinent family history. History  Substance Use Topics  . Smoking status: Current Every Day Smoker -- 1.00 packs/day    Types: Cigarettes  . Smokeless tobacco: Not on file  . Alcohol Use: No   OB History   Grav Para Term Preterm Abortions TAB SAB Ect Mult Living                 Review of Systems  Constitutional: Positive for fever and chills. Negative for appetite change.  HENT: Positive for congestion, rhinorrhea and sore throat. Negative for ear pain, sinus pressure, trouble swallowing and voice change.   Eyes:  Negative for discharge.  Respiratory: Negative for cough, shortness of breath, wheezing and stridor.   Cardiovascular: Negative for chest pain.  Gastrointestinal: Negative for nausea and abdominal pain.  Genitourinary: Negative.     Allergies  Flexeril and Morphine and related  Home Medications   Current Outpatient Rx  Name  Route  Sig  Dispense  Refill  . albuterol (PROVENTIL HFA;VENTOLIN HFA) 108 (90 BASE) MCG/ACT inhaler   Inhalation   Inhale 1-2 puffs into the lungs every 4 (four) hours as needed for wheezing.   1 Inhaler   6   . albuterol (PROVENTIL) (2.5 MG/3ML) 0.083% nebulizer solution   Nebulization   Take 2.5 mg by nebulization every 6 (six) hours as needed for wheezing or shortness of breath.         . diazepam (VALIUM) 5 MG tablet   Oral   Take 5 mg by mouth daily.         . lansoprazole (PREVACID) 30 MG capsule   Oral   Take 30 mg by mouth daily at 12 noon.         . mirtazapine (REMERON) 30 MG tablet   Oral   Take 30 mg by mouth daily.         . Pseudoeph-Doxylamine-DM-APAP (NYQUIL PO)   Oral   Take  10 mLs by mouth daily.         Marland Kitchen tiotropium (SPIRIVA) 18 MCG inhalation capsule   Inhalation   Place 1 capsule (18 mcg total) into inhaler and inhale daily.   30 capsule   6   . Alum & Mag Hydroxide-Simeth (MAGIC MOUTHWASH W/LIDOCAINE) SOLN   Oral   Take 10 mLs by mouth 4 (four) times daily as needed (throat pain).   120 mL   0     Equal parts.   Marland Kitchen oxyCODONE-acetaminophen (PERCOCET/ROXICET) 5-325 MG per tablet   Oral   Take 1 tablet by mouth every 4 (four) hours as needed for severe pain.   20 tablet   0    BP 145/87  Pulse 104  Temp(Src) 98.6 F (37 C) (Oral)  Resp 20  Ht 5\' 10"  (1.778 m)  Wt 199 lb (90.266 kg)  BMI 28.55 kg/m2  SpO2 98% Physical Exam  Constitutional: She is oriented to person, place, and time. She appears well-developed and well-nourished.  HENT:  Head: Normocephalic and atraumatic.  Right Ear: Tympanic  membrane and ear canal normal.  Left Ear: Tympanic membrane and ear canal normal.  Nose: Rhinorrhea present. No mucosal edema.  Mouth/Throat: Uvula is midline and mucous membranes are normal. Posterior oropharyngeal erythema present. No oropharyngeal exudate, posterior oropharyngeal edema or tonsillar abscesses.  Eyes: Conjunctivae are normal.  Cardiovascular: Normal rate and normal heart sounds.   Pulmonary/Chest: Effort normal. No respiratory distress. She has no wheezes. She has no rales.  Abdominal: Soft. There is no tenderness.  Musculoskeletal: Normal range of motion.  Neurological: She is alert and oriented to person, place, and time.  Skin: Skin is warm and dry. No rash noted.  Psychiatric: She has a normal mood and affect.    ED Course  Procedures (including critical care time) Labs Review Labs Reviewed  RAPID STREP SCREEN  CULTURE, GROUP A STREP   Imaging Review No results found.  EKG Interpretation   None       MDM   1. Viral pharyngitis    Patients labs and/or radiological studies were viewed and considered during the medical decision making and disposition process. Strep culture pending.  Exam most c/w viral pharyngitis, negative rapid strep.  Pt does have pain out of proportion to findings.  She does have frequent ed visits for various pain complaints. Suspect low threshold for pain tolerance.  She was given toradol 60 IM and viscous lidocaine here with no improvement in pain sx.  Given oxycodone tab with improving pain by time of dispo.  She has no stridor, she is swallowing secretions, no voice change to suggest epiglottis or deep tissue abscess.  Encouraged f/u with pcp if not improving over the next several days.    Burgess Amor, PA-C 08/01/13 1123

## 2013-08-02 ENCOUNTER — Telehealth: Payer: Self-pay | Admitting: *Deleted

## 2013-08-06 NOTE — ED Provider Notes (Signed)
History/physical exam/procedure(s) were performed by non-physician practitioner and as supervising physician I was immediately available for consultation/collaboration. I have reviewed all notes and am in agreement with care and plan.   Marjory Meints S Marketta Valadez, MD 08/06/13 1511 

## 2013-08-07 ENCOUNTER — Ambulatory Visit (INDEPENDENT_AMBULATORY_CARE_PROVIDER_SITE_OTHER): Admitting: Internal Medicine

## 2013-08-07 ENCOUNTER — Encounter (INDEPENDENT_AMBULATORY_CARE_PROVIDER_SITE_OTHER): Payer: Self-pay | Admitting: Internal Medicine

## 2013-08-07 VITALS — BP 118/70 | HR 80 | Temp 98.6°F | Ht 70.0 in | Wt 224.9 lb

## 2013-08-07 DIAGNOSIS — K219 Gastro-esophageal reflux disease without esophagitis: Secondary | ICD-10-CM | POA: Insufficient documentation

## 2013-08-07 DIAGNOSIS — R1310 Dysphagia, unspecified: Secondary | ICD-10-CM

## 2013-08-07 MED ORDER — OMEPRAZOLE 40 MG PO CPDR: 40.0000 mg | DELAYED_RELEASE_CAPSULE | Freq: Every day | ORAL | Status: DC

## 2013-08-07 NOTE — Patient Instructions (Signed)
DG esophagus. Rx for omeprazole.

## 2013-08-07 NOTE — Progress Notes (Signed)
Subjective:     Patient ID: Kara Kelly, female   DOB: 1966-06-10, 48 y.o.   MRN: [id number]  HPI Referred to our office by Dr Jeanice Limurham for dysphagia.   She says food are lodging in her mid-esophagus. Symptoms are occuring usually ever day. Symptoms for about a year and gradually getting worse. She says even liquids are lodging.  She will sit till it moves on down. Appetite is good. No weight loss. She has actually gained weight. She does have acid reflux but not often.  Sometimes she has nausea.  She usually has a BM about once a day. She has chronic constipation. No melena or bright red rectal bleeding. Hx of Psychiatric problems. She tells she [facility].   Review of Systems see  hpi Current Outpatient Prescriptions  Medication Sig Dispense Refill  . albuterol (PROVENTIL HFA;VENTOLIN HFA) 108 (90 BASE) MCG/ACT inhaler Inhale 1-2 puffs into the lungs every 4 (four) hours as needed for wheezing.  1 Inhaler  6  . albuterol (PROVENTIL) (2.5 MG/3ML) 0.083% nebulizer solution Take 2.5 mg by nebulization every 6 (six) hours as needed for wheezing or shortness of breath.      . diazepam (VALIUM) 5 MG tablet Take 5 mg by mouth daily.      . mirtazapine (REMERON) 30 MG tablet Take 30 mg by mouth daily.      . Pseudoeph-Doxylamine-DM-APAP (NYQUIL PO) Take 10 mLs by mouth daily.      Marland Kitchen. tiotropium (SPIRIVA) 18 MCG inhalation capsule Place 1 capsule (18 mcg total) into inhaler and inhale daily.  30 capsule  6   No current facility-administered medications for this visit.   Past Medical History  Diagnosis Date  . Panic attack   . Depression   . Endometriosis   . Bipolar 1 disorder   . Headache(784.0)   . Personality disorder   . Tuberculosis     been tested positive for it  . COPD (chronic obstructive pulmonary disease)   . Pneumonia complicating pregnancy   . Pneumonia     chronic per lung doctor   Past Surgical History  Procedure Laterality  Date  . Appendectomy    . Cholecystectomy    . Abdominal hysterectomy    . Neck surgery     Allergies  Allergen Reactions  . Flexeril [Cyclobenzaprine] Other (See Comments)    Doesn't like the way it makes her feel.   . Morphine And Related Other (See Comments)    Alters mental status: Makes violent       Objective:   Physical Exam   Filed Vitals:   08/07/13 1457  BP: 118/70  Pulse: 80  Temp: 98.6 F (37 C)  Height: 5\' 10"  (1.778 m)  Weight: 224 lb 14.4 oz (102.014 kg)   Alert and oriented. Skin warm and dry. Oral mucosa is moist.   . Sclera anicteric, conjunctivae is pink. Thyroid not enlarged. No cervical lymphadenopathy. Lungs clear. Heart regular rate and rhythm.  Abdomen is soft. Bowel sounds are positive. No hepatomegaly. No abdominal masses felt. No tenderness.  No edema to lower extremities.    Multiple tattoos     Assessment:    Dysphagia to solids and liquids. Esophageal stricture needs to be ruled out.     Plan:    DG Esophagus. Further recommendations once we have results back.  Rx for Omeprazole eprescribed to her pharmacy.

## 2013-08-12 ENCOUNTER — Telehealth: Payer: Self-pay | Admitting: *Deleted

## 2013-08-12 ENCOUNTER — Ambulatory Visit (INDEPENDENT_AMBULATORY_CARE_PROVIDER_SITE_OTHER): Admitting: Family Medicine

## 2013-08-12 ENCOUNTER — Encounter: Payer: Self-pay | Admitting: Family Medicine

## 2013-08-12 VITALS — BP 110/80 | HR 88 | Temp 98.4°F | Resp 18 | Ht 70.0 in | Wt 220.0 lb

## 2013-08-12 DIAGNOSIS — J019 Acute sinusitis, unspecified: Secondary | ICD-10-CM

## 2013-08-12 DIAGNOSIS — J441 Chronic obstructive pulmonary disease with (acute) exacerbation: Secondary | ICD-10-CM | POA: Insufficient documentation

## 2013-08-12 DIAGNOSIS — G43909 Migraine, unspecified, not intractable, without status migrainosus: Secondary | ICD-10-CM

## 2013-08-12 MED ORDER — METHYLPREDNISOLONE ACETATE 80 MG/ML IJ SUSP
80.0000 mg | Freq: Once | INTRAMUSCULAR | Status: AC
  Administered 2013-08-12: 80 mg via INTRAMUSCULAR

## 2013-08-12 MED ORDER — HYDROCODONE-ACETAMINOPHEN 5-325 MG PO TABS: 1.0000 | ORAL_TABLET | Freq: Four times a day (QID) | ORAL | Status: DC | PRN

## 2013-08-12 MED ORDER — PREDNISONE 20 MG PO TABS: ORAL_TABLET | ORAL | Status: DC

## 2013-08-12 MED ORDER — DOXYCYCLINE HYCLATE 100 MG PO TABS
100.0000 mg | ORAL_TABLET | Freq: Two times a day (BID) | ORAL | Status: DC
Start: 2013-08-12 — End: 2013-09-24

## 2013-08-12 NOTE — Assessment & Plan Note (Signed)
Antibiotics

## 2013-08-12 NOTE — Assessment & Plan Note (Addendum)
I think this started as viral illness and worsened with the sinus drainage IM steroids given Doxycycline Steroids Albuterol nebs

## 2013-08-12 NOTE — Telephone Encounter (Signed)
Pt called stating she is having a migraine, still feels sick to stomach wants to know if she can have a refill on pain medication or something to help her migraine go away.

## 2013-08-12 NOTE — Telephone Encounter (Signed)
Pt here for OV.  

## 2013-08-12 NOTE — Assessment & Plan Note (Signed)
Migraine due to sinusitis Prednisone should help Given 10 tablets of hydrocodone

## 2013-08-12 NOTE — Progress Notes (Signed)
   Subjective:    Patient ID: Kara Kelly, female    DOB: 1966/04/25, 48 y.o.   MRN: [id number]  HPI  Cough and congestion, sinus pressure causing  migraine. She was seen in the ER about 2 and half weeks ago diagnosed with viral pharyngitis. Since then she continues to worsen has had a lot of sinus pressure and drainage sore throat and now with chest congestion wheezing. The sinus pressure is also set up a migraine which she's had the past 4 days. She has been using her albuterol but has had minimal improvement. She denies any fever   Review of Systems  GEN- denies fatigue, fever, weight loss,weakness, recent illness HEENT- denies eye drainage, change in vision, nasal discharge, CVS- denies chest pain, palpitations RESP- denies SOB, cough, wheeze ABD- denies N/V, change in stools, abd pain GU- denies dysuria, hematuria, dribbling, incontinence MSK- denies joint pain, muscle aches, injury Neuro- denies headache, dizziness, syncope, seizure activity      Objective:   Physical Exam  GEN- NAD, alert and oriented x3 HEENT- PERRL, EOMI, non injected sclera, pink conjunctiva, MMM, oropharynx mild injection, TM clear bilat no effusion,  + maxillary sinus tenderness, inflammed turbinates,  Nasal drainage  Neck- Supple, no LAD CVS- RRR, no murmur RESP-bilateral wheeze, mild rhonchi bilat, normal WOB EXT- No edema Pulses- Radial 2+         Assessment & Plan:

## 2013-08-12 NOTE — Patient Instructions (Signed)
Take the antibiotics Start the prednisone pills tomorrow 10 tablets of pain medication given F/u as previous

## 2013-08-13 NOTE — Telephone Encounter (Signed)
Pt has had 185 Hydrocodones RX to her in December and 270 Valiums.  We are not refilling any narcotics at this time

## 2013-08-13 NOTE — Telephone Encounter (Signed)
Kim,  Was this routed to me for a particular reason?

## 2013-08-14 ENCOUNTER — Ambulatory Visit (HOSPITAL_COMMUNITY)
Admission: RE | Admit: 2013-08-14 | Discharge: 2013-08-14 | Disposition: A | Discharge status: Home / Self Care | Source: Ambulatory Visit | Attending: Internal Medicine | Admitting: Internal Medicine

## 2013-08-14 DIAGNOSIS — R1310 Dysphagia, unspecified: Secondary | ICD-10-CM | POA: Insufficient documentation

## 2013-08-14 DIAGNOSIS — K219 Gastro-esophageal reflux disease without esophagitis: Secondary | ICD-10-CM

## 2013-08-14 DIAGNOSIS — K224 Dyskinesia of esophagus: Secondary | ICD-10-CM | POA: Insufficient documentation

## 2013-08-15 ENCOUNTER — Telehealth (INDEPENDENT_AMBULATORY_CARE_PROVIDER_SITE_OTHER): Payer: Self-pay | Admitting: Internal Medicine

## 2013-08-15 NOTE — Telephone Encounter (Signed)
Opened in error

## 2013-08-20 ENCOUNTER — Ambulatory Visit: Admitting: Neurology

## 2013-08-22 ENCOUNTER — Encounter: Payer: Self-pay | Admitting: Neurology

## 2013-08-22 ENCOUNTER — Ambulatory Visit (INDEPENDENT_AMBULATORY_CARE_PROVIDER_SITE_OTHER): Admitting: Neurology

## 2013-08-22 ENCOUNTER — Encounter (INDEPENDENT_AMBULATORY_CARE_PROVIDER_SITE_OTHER): Payer: Self-pay

## 2013-08-22 VITALS — BP 143/85 | HR 106 | Ht 70.0 in | Wt 217.0 lb

## 2013-08-22 DIAGNOSIS — G43909 Migraine, unspecified, not intractable, without status migrainosus: Secondary | ICD-10-CM

## 2013-08-22 DIAGNOSIS — M549 Dorsalgia, unspecified: Secondary | ICD-10-CM

## 2013-08-22 DIAGNOSIS — R202 Paresthesia of skin: Secondary | ICD-10-CM

## 2013-08-22 DIAGNOSIS — F3162 Bipolar disorder, current episode mixed, moderate: Secondary | ICD-10-CM

## 2013-08-22 DIAGNOSIS — R32 Unspecified urinary incontinence: Secondary | ICD-10-CM

## 2013-08-22 DIAGNOSIS — G8929 Other chronic pain: Secondary | ICD-10-CM

## 2013-08-22 DIAGNOSIS — J189 Pneumonia, unspecified organism: Secondary | ICD-10-CM | POA: Insufficient documentation

## 2013-08-22 DIAGNOSIS — R209 Unspecified disturbances of skin sensation: Secondary | ICD-10-CM

## 2013-08-22 NOTE — Progress Notes (Signed)
[facility]  PATIENT: Kara Kelly DOB: 03-Sep-1965  HISTORICAL  (Initial visit Aug 22, 2013) Kara Kelly is a 48 year old right-handed Caucasian female, referred by her primary care physician Dr. Jeanice Lim and Neurosurgeon Dr. Mikal Plane for evaluation of incontinence, neck pain  She had past medical history of severe depression,  getting worse since the summer of 2014, point of suicidal, she was medicated had any with polypharmacy she was drowsy, around October 2014, she developed arm bladder incontinence, it was attributed to worsening depression, and medication side effect.  She began to receive ECT treatments since December 2014 at [facility], there was significant improvement over depression, she is currently taking mirtazapine 30 mg every day, Valium 5 mg every day, 4 other antidepression was stopped, she could not recall the name of those medications.  Despite improvement of her depression, she had persistent bowel and bladder incontinence, in addition she complains of neck pain, radiating pain to her left arm, and also right leg numbness, pain, bilateral hand and feet numbness tingling, mild gait difficulty    REVIEW OF SYSTEMS: Full 14 system review of systems performed and notable only for weight gain, faitgue, ringing in ears, shortness of breath, diarrhea, urination problem, joint pain, cramps, memory loss, confusion, headaches, numbness, weakness, tremor, restless leg, depression, anxiety, decreased energy, this interesting activities, racing thoughts,  ALLERGIES: Allergies  Allergen Reactions  . Flexeril [Cyclobenzaprine] Other (See Comments)    Doesn't like the way it makes her feel.   . Morphine And Related Other (See Comments)    Alters mental status: Makes violent    HOME MEDICATIONS: Outpatient Prescriptions Prior to Visit  Medication Sig Dispense Refill  . albuterol (PROVENTIL HFA;VENTOLIN HFA) 108 (90 BASE) MCG/ACT inhaler Inhale 1-2  puffs into the lungs every 4 (four) hours as needed for wheezing.  1 Inhaler  6  . albuterol (PROVENTIL) (2.5 MG/3ML) 0.083% nebulizer solution Take 2.5 mg by nebulization every 6 (six) hours as needed for wheezing or shortness of breath.      . doxycycline (VIBRA-TABS) 100 MG tablet Take 1 tablet (100 mg total) by mouth 2 (two) times daily.  14 tablet  0  . mirtazapine (REMERON) 30 MG tablet Take 30 mg by mouth daily.      Marland Kitchen omeprazole (PRILOSEC) 40 MG capsule Take 1 capsule (40 mg total) by mouth daily.  90 capsule  3  . Pseudoeph-Doxylamine-DM-APAP (NYQUIL PO) Take 10 mLs by mouth daily.      Marland Kitchen tiotropium (SPIRIVA) 18 MCG inhalation capsule Place 1 capsule (18 mcg total) into inhaler and inhale daily.  30 capsule  6  . diazepam (VALIUM) 5 MG tablet Take 5 mg by mouth daily.      Marland Kitchen HYDROcodone-acetaminophen (NORCO) 5-325 MG per tablet Take 1 tablet by mouth every 6 (six) hours as needed for moderate pain.  10 tablet  0  . predniSONE (DELTASONE) 20 MG tablet Take 40mg  x 5 days  10 tablet  0   No facility-administered medications prior to visit.    PAST MEDICAL HISTORY: Past Medical History  Diagnosis Date  . Panic attack   . Depression   . Endometriosis   . Bipolar 1 disorder   . Headache(784.0)   . Personality disorder   . Tuberculosis     been tested positive for it  . COPD (chronic obstructive pulmonary disease)   . Pneumonia complicating pregnancy   . Pneumonia     chronic per lung doctor    PAST SURGICAL  HISTORY: Past Surgical History  Procedure Laterality Date  . Appendectomy    . Cholecystectomy    . Abdominal hysterectomy    . Neck surgery   2007  . Cholecystectomy      FAMILY HISTORY: History reviewed. No pertinent family history.  SOCIAL HISTORY:  History   Social History  . Marital Status: Married    Spouse Name: Onalee HuaDavid    Number of Children: 1  . Years of Education: GED   Occupational History    disabled   Social History Main Topics  . Smoking  status: Current Every Day Smoker -- 1.00 packs/day    Types: Cigarettes  . Smokeless tobacco: Never Used  . Alcohol Use: No  . Drug Use: No     Comment: last use x 4 weeks ago  . Sexual Activity: Not on file   Other Topics Concern  . Not on file   Social History Narrative   Patient lives at home with her husband Onalee Hua(David)   Disabled   Education GED   Right handed.   Caffeine- one glass of sweet daily.     PHYSICAL EXAM   Filed Vitals:   08/22/13 0951  BP: 143/85  Pulse: 106  Height: 5\' 10"  (1.778 m)  Weight: 217 lb (98.431 kg)    Not recorded    Body mass index is 31.14 kg/(m^2).   Generalized: In no acute distress  Neck: Supple, no carotid bruits   Cardiac: Regular rate rhythm  Pulmonary: Clear to auscultation bilaterally  Musculoskeletal: No deformity  Neurological examination  Mentation: Alert oriented to time, place, history taking, and causual conversation  Cranial nerve II-XII: Pupils were equal round reactive to light extraocular movements were full, Visual field were full on confrontational test. Bilateral fundi were sharp.  Facial sensation and strength were normal. Hearing was intact to finger rubbing bilaterally. Uvula tongue midline.  head turning and shoulder shrug and were normal and symmetric.Tongue protrusion into cheek strength was normal.  Motor: normal tone, bulk and strength.  Sensory: Intact to fine touch, pinprick, preserved vibratory sensation, and proprioception at toes.  Coordination: Normal finger to nose, heel-to-shin bilaterally there was no truncal ataxia  Gait: Rising up from seated position without assistance, mildly cautious stiff gait, she could not walk on her heels, mild difficulty with tiptoe, tandem walking Romberg signs: Negative  Deep tendon reflexes: Brachioradialis 2/2, biceps 2/2, triceps 2/2, patellar 2/2, Achilles 2/2, plantar responses were flexor bilaterally.   DIAGNOSTIC DATA (LABS, IMAGING, TESTING) - I  reviewed patient records, labs, notes, testing and imaging myself where available.  Lab Results  Component Value Date   WBC 9.2 05/05/2013   HGB 14.4 05/05/2013   HCT 41.7 05/05/2013   MCV 90.1 05/05/2013   PLT 226 05/05/2013      Component Value Date/Time   NA 139 05/05/2013 1424   K 4.1 05/05/2013 1424   CL 103 05/05/2013 1424   CO2 27 05/05/2013 1424   GLUCOSE 87 05/05/2013 1424   BUN 13 05/05/2013 1424   CREATININE 0.84 05/05/2013 1424   CALCIUM 9.4 05/05/2013 1424   PROT 6.3 05/05/2013 1424   ALBUMIN 3.3* 05/05/2013 1424   AST 35 05/05/2013 1424   ALT 36* 05/05/2013 1424   ALKPHOS 88 05/05/2013 1424   BILITOT 0.2* 05/05/2013 1424   GFRNONAA 83* 05/05/2013 1424   GFRAA >90 05/05/2013 1424    ASSESSMENT AND PLAN   48 year old Caucasian female, with past medical history of bipolar disorder, now presenting with 3  months history of urinary and and probably incontinence since October 2014, also intermittent bilateral feet, hands paresthesia, she has brisk reflexes on examination  1 localized the lesion to cervical spine vs. Cerebral 2 proceed with MRI of the brain, and cervical with without contrast.   3 return to clinic in one month.           Levert Feinstein, M.D. Ph.D.  [facility] [address], [address] [place], [place] [address] [phone]

## 2013-08-23 LAB — HIV ANTIBODY (ROUTINE TESTING W REFLEX)
HIV 1/O/2 Abs-Index Value: <1 (ref <1.00)
HIV-1/HIV-2 Ab: NONREACTIVE

## 2013-08-23 LAB — HEPATITIS PANEL, ACUTE
HEP A IGM: NEGATIVE
HEP B S AG: NEGATIVE
Hep B C IgM: NEGATIVE
Hep C Virus Ab: >11 s/co ratio — ABNORMAL HIGH (ref 0.0–0.9)

## 2013-08-23 LAB — SEDIMENTATION RATE: SED RATE: 7 mm/h (ref 0–32)

## 2013-08-23 LAB — THYROID PANEL WITH TSH
FREE THYROXINE INDEX: 2 (ref 1.2–4.9)
T3 UPTAKE RATIO: 25 % (ref 24–39)
T4, Total: 8.1 ug/dL (ref 4.5–12.0)
TSH: 0.554 u[IU]/mL (ref 0.450–4.500)

## 2013-08-23 LAB — RPR: SYPHILIS RPR SCR: NONREACTIVE

## 2013-08-23 LAB — VITAMIN B12: Vitamin B-12: 862 pg/mL (ref 211–946)

## 2013-08-23 LAB — ANA W/REFLEX IF POSITIVE: Anti Nuclear Antibody(ANA): NEGATIVE

## 2013-08-23 LAB — FOLATE: Folate: 16 ng/mL (ref >3.0)

## 2013-08-23 LAB — C-REACTIVE PROTEIN: CRP: 3.5 mg/L (ref 0.0–4.9)

## 2013-08-23 LAB — LYME, TOTAL AB TEST/REFLEX: Lyme IgG/IgM Ab: <0.91 {ISR} (ref 0.00–0.90)

## 2013-09-19 IMAGING — CR DG LUMBAR SPINE 2-3V
3 series · 3 of 3 positions shown · non-contrast
Comparison: 02/09/2007

CLINICAL DATA: Back pain.

LUMBAR SPINE - 2-3 VIEW

[view not recorded (1 of 3)]
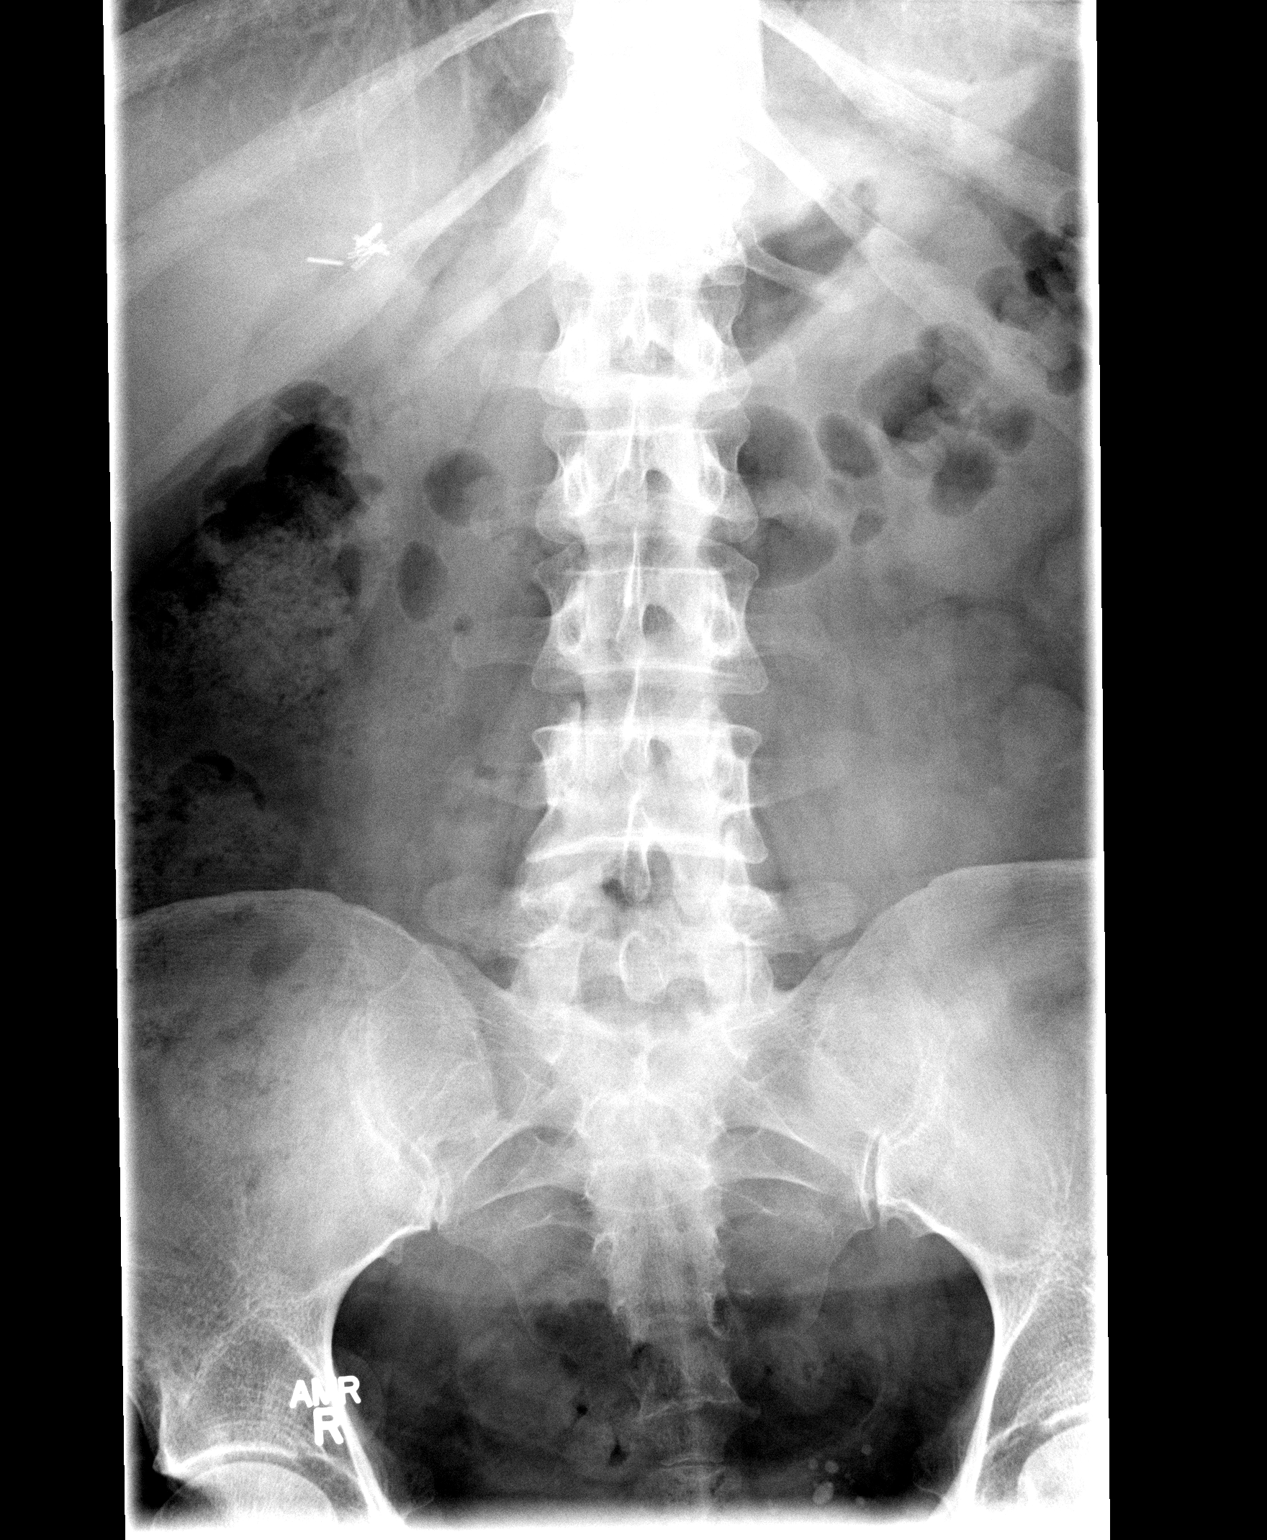

[view not recorded (2 of 3)]
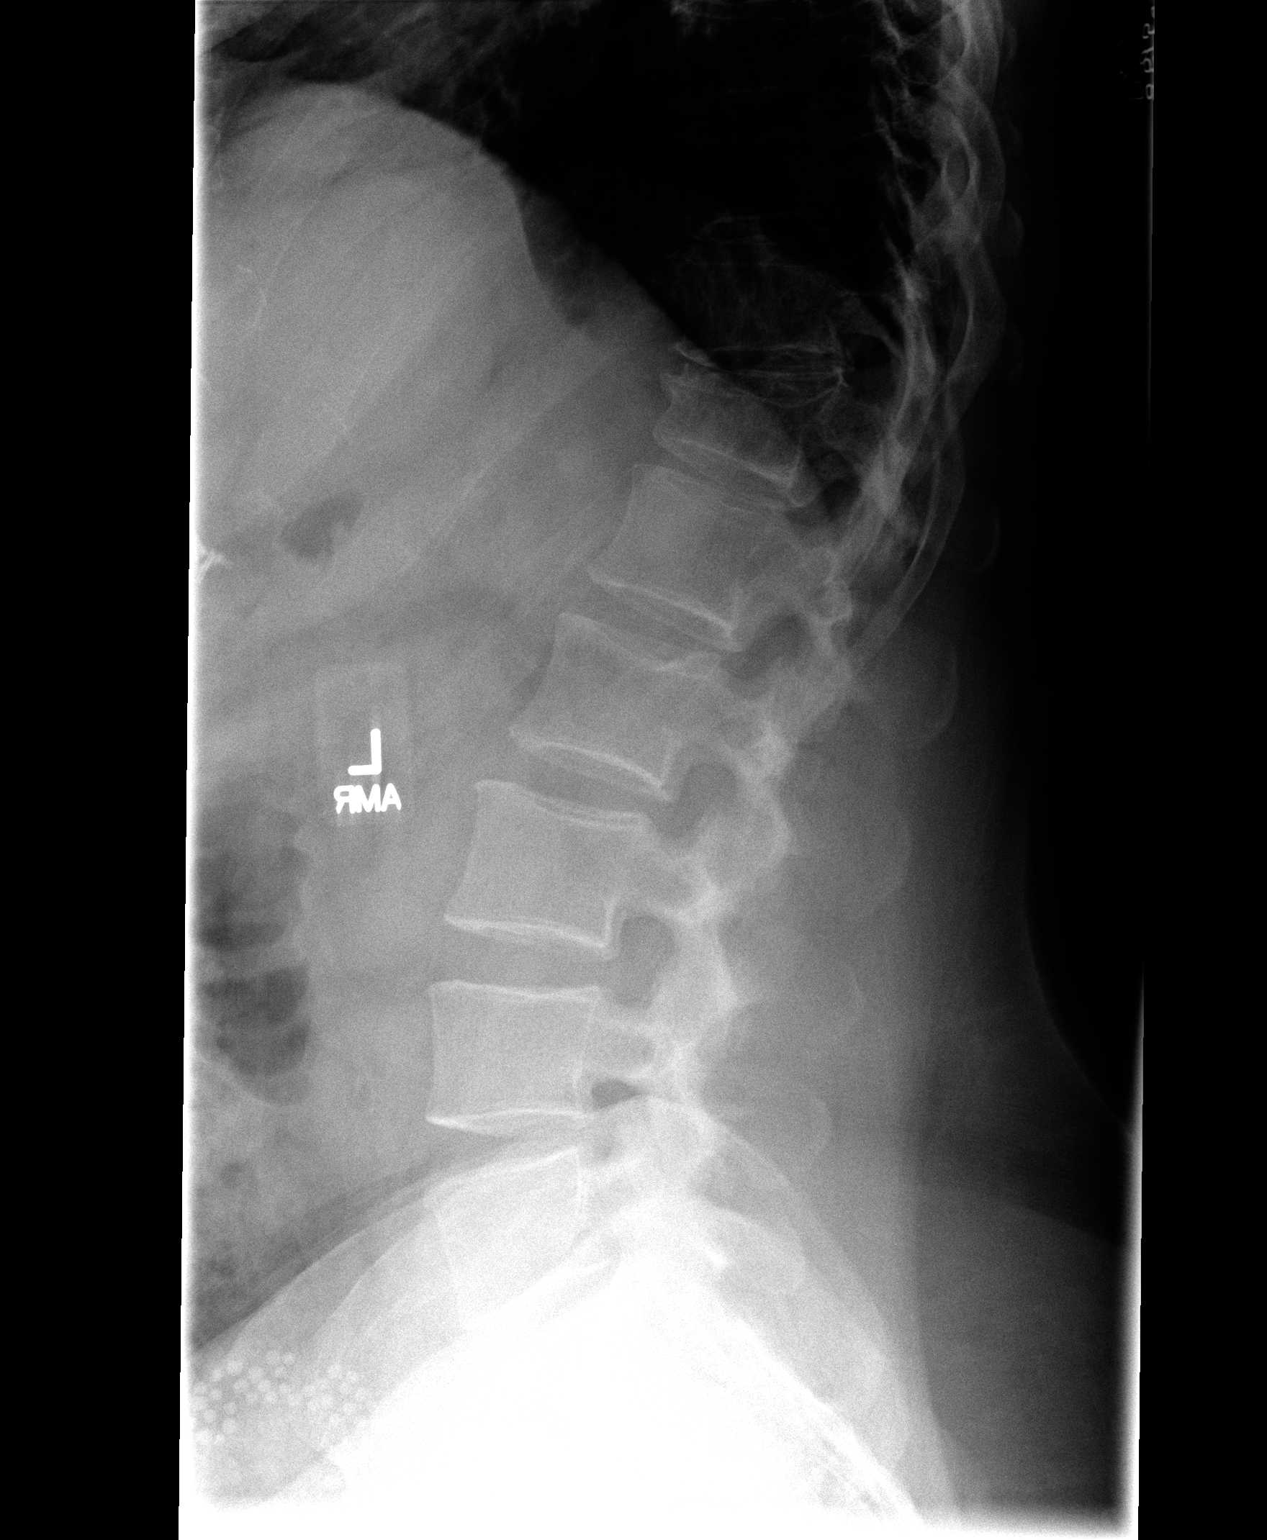

[view not recorded (3 of 3)]
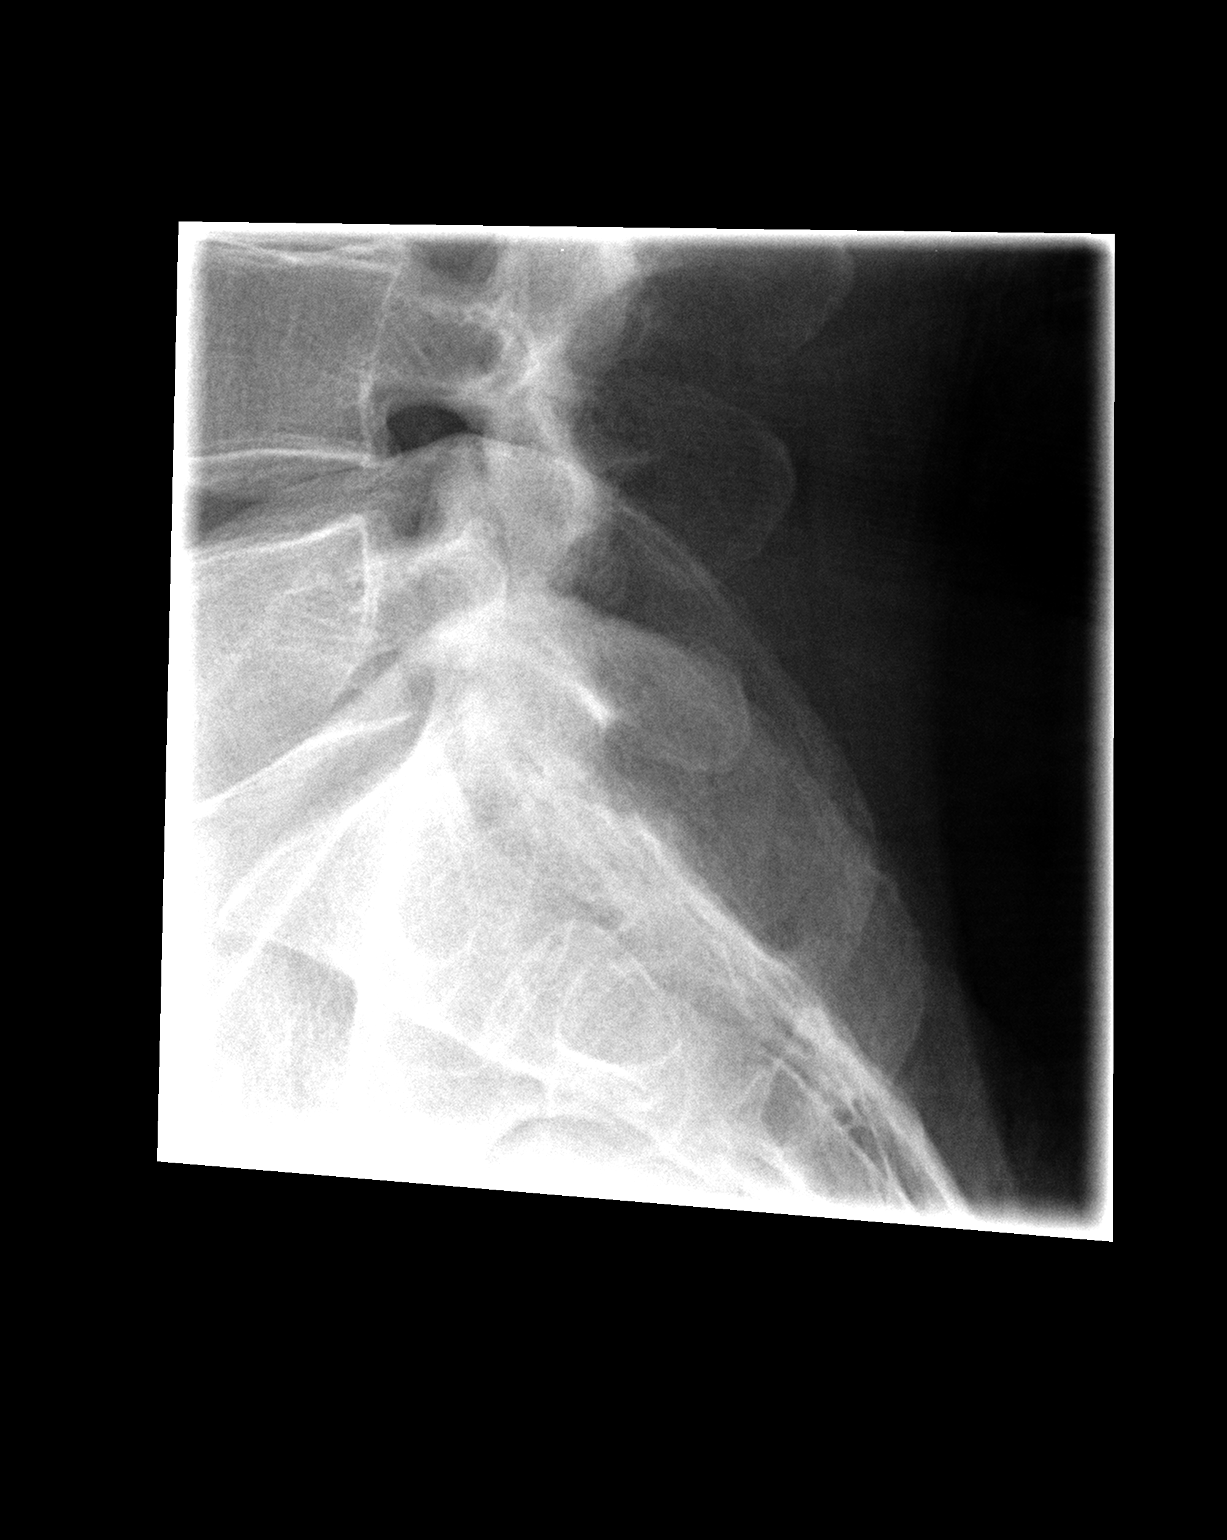

[3 of 3 positions shown; findings below may reference images not displayed]

FINDINGS: There is an old compression fracture of the anterior-
superior aspect of the T12.  There is no visible protrusion of bone
into the spinal canal.

The lumbar spine appears normal with no disc space narrowing,
spondylolisthesis, spondylolysis, or facet arthritis.
IMPRESSION: Normal lumbar spine.  Old compression fracture of the superior
aspect of T12.

## 2013-09-23 ENCOUNTER — Telehealth: Payer: Self-pay | Admitting: Neurology

## 2013-09-23 ENCOUNTER — Ambulatory Visit: Admitting: Neurology

## 2013-09-23 NOTE — Telephone Encounter (Signed)
Patient is requesting we call in meds for her to take prior to MRI.  She uses [facility].  Please advise.  Thank you.

## 2013-09-23 NOTE — Telephone Encounter (Signed)
I called patient at home and on cell.  Got no answer on either line.  VM was not set up to LM.

## 2013-09-23 NOTE — Telephone Encounter (Signed)
Pt called spoke with GI, had her scheduled for her MRI, but stated she needed to call us back to advise to have Dr. Terrace ArabiaYan order her a prescription due to anxiety and panic attacks. Please call pt when this has been called in. [facility] [place] [phone].

## 2013-09-23 NOTE — Telephone Encounter (Signed)
Kara Kelly: let her know, we can provide Xanax sample.  If she prefer, call in valium 5mg  prn 30 minutes before MRI, may repeat twice for total of 4 tabs, no refill

## 2013-09-24 ENCOUNTER — Encounter (HOSPITAL_COMMUNITY): Payer: Self-pay | Admitting: Emergency Medicine

## 2013-09-24 ENCOUNTER — Emergency Department (HOSPITAL_COMMUNITY)
Admission: EM | Admit: 2013-09-24 | Discharge: 2013-09-25 | Disposition: A | Discharge status: Home / Self Care | Attending: Emergency Medicine | Admitting: Emergency Medicine

## 2013-09-24 DIAGNOSIS — Z8701 Personal history of pneumonia (recurrent): Secondary | ICD-10-CM | POA: Insufficient documentation

## 2013-09-24 DIAGNOSIS — Z79899 Other long term (current) drug therapy: Secondary | ICD-10-CM | POA: Insufficient documentation

## 2013-09-24 DIAGNOSIS — IMO0002 Reserved for concepts with insufficient information to code with codable children: Secondary | ICD-10-CM | POA: Insufficient documentation

## 2013-09-24 DIAGNOSIS — F3130 Bipolar disorder, current episode depressed, mild or moderate severity, unspecified: Secondary | ICD-10-CM | POA: Insufficient documentation

## 2013-09-24 DIAGNOSIS — Z8611 Personal history of tuberculosis: Secondary | ICD-10-CM | POA: Insufficient documentation

## 2013-09-24 DIAGNOSIS — F410 Panic disorder [episodic paroxysmal anxiety] without agoraphobia: Secondary | ICD-10-CM | POA: Insufficient documentation

## 2013-09-24 DIAGNOSIS — Z8742 Personal history of other diseases of the female genital tract: Secondary | ICD-10-CM | POA: Insufficient documentation

## 2013-09-24 DIAGNOSIS — J449 Chronic obstructive pulmonary disease, unspecified: Secondary | ICD-10-CM | POA: Insufficient documentation

## 2013-09-24 DIAGNOSIS — F17200 Nicotine dependence, unspecified, uncomplicated: Secondary | ICD-10-CM | POA: Insufficient documentation

## 2013-09-24 DIAGNOSIS — J4489 Other specified chronic obstructive pulmonary disease: Secondary | ICD-10-CM | POA: Insufficient documentation

## 2013-09-24 DIAGNOSIS — F419 Anxiety disorder, unspecified: Secondary | ICD-10-CM

## 2013-09-24 DIAGNOSIS — R45851 Suicidal ideations: Secondary | ICD-10-CM | POA: Insufficient documentation

## 2013-09-24 LAB — CBC
HEMATOCRIT: 47.9 % — AB (ref 36.0–46.0)
HEMOGLOBIN: 16.7 g/dL — AB (ref 12.0–15.0)
MCH: 32.1 pg (ref 26.0–34.0)
MCHC: 34.9 g/dL (ref 30.0–36.0)
MCV: 92.1 fL (ref 78.0–100.0)
Platelets: 260 10*3/uL (ref 150–400)
RBC: 5.2 MIL/uL — AB (ref 3.87–5.11)
RDW: 13.3 % (ref 11.5–15.5)
WBC: 11 10*3/uL — ABNORMAL HIGH (ref 4.0–10.5)

## 2013-09-24 LAB — COMPREHENSIVE METABOLIC PANEL
ALT: 28 U/L (ref 0–35)
AST: 26 U/L (ref 0–37)
Albumin: 4.2 g/dL (ref 3.5–5.2)
Alkaline Phosphatase: 66 U/L (ref 39–117)
BILIRUBIN TOTAL: 0.3 mg/dL (ref 0.3–1.2)
BUN: 9 mg/dL (ref 6–23)
CO2: 17 meq/L — AB (ref 19–32)
Calcium: 10.3 mg/dL (ref 8.4–10.5)
Chloride: 100 mEq/L (ref 96–112)
Creatinine, Ser: 0.86 mg/dL (ref 0.50–1.10)
GFR, EST NON AFRICAN AMERICAN: 80 mL/min — AB (ref >90)
GLUCOSE: 116 mg/dL — AB (ref 70–99)
POTASSIUM: 4.2 meq/L (ref 3.7–5.3)
Sodium: 137 mEq/L (ref 137–147)
TOTAL PROTEIN: 7.8 g/dL (ref 6.0–8.3)

## 2013-09-24 LAB — ACETAMINOPHEN LEVEL

## 2013-09-24 LAB — RAPID URINE DRUG SCREEN, HOSP PERFORMED
Amphetamines: NOT DETECTED
BARBITURATES: NOT DETECTED
Benzodiazepines: POSITIVE — AB
COCAINE: NOT DETECTED
Opiates: NOT DETECTED
TETRAHYDROCANNABINOL: POSITIVE — AB

## 2013-09-24 LAB — SALICYLATE LEVEL: Salicylate Lvl: 4.2 mg/dL (ref 2.8–20.0)

## 2013-09-24 LAB — ETHANOL: Alcohol, Ethyl (B): <11 mg/dL (ref 0–11)

## 2013-09-24 MED ORDER — LORAZEPAM 1 MG PO TABS
1.0000 mg | ORAL_TABLET | Freq: Three times a day (TID) | ORAL | Status: DC | PRN
  Administered 2013-09-24 – 2013-09-25 (×3): 1 mg via ORAL
  Filled 2013-09-24 (×3): qty 1

## 2013-09-24 MED ORDER — ZOLPIDEM TARTRATE 5 MG PO TABS
5.0000 mg | ORAL_TABLET | Freq: Every evening | ORAL | Status: DC | PRN
  Administered 2013-09-25: 5 mg via ORAL
  Filled 2013-09-24: qty 1

## 2013-09-24 MED ORDER — ACETAMINOPHEN 325 MG PO TABS
650.0000 mg | ORAL_TABLET | ORAL | Status: DC | PRN
  Administered 2013-09-24 – 2013-09-25 (×2): 650 mg via ORAL
  Filled 2013-09-24 (×2): qty 2

## 2013-09-24 MED ORDER — ZIPRASIDONE HCL 20 MG PO CAPS: 20.0000 mg | ORAL_CAPSULE | Freq: Two times a day (BID) | ORAL | Status: DC

## 2013-09-24 MED ORDER — NICOTINE 21 MG/24HR TD PT24
21.0000 mg | MEDICATED_PATCH | Freq: Every day | TRANSDERMAL | Status: DC
  Administered 2013-09-24: 21 mg via TRANSDERMAL
  Filled 2013-09-24: qty 1

## 2013-09-24 MED ORDER — ZIPRASIDONE HCL 20 MG PO CAPS
20.0000 mg | ORAL_CAPSULE | Freq: Once | ORAL | Status: AC
  Administered 2013-09-24: 20 mg via ORAL
  Filled 2013-09-24: qty 1

## 2013-09-24 MED ORDER — ONDANSETRON HCL 4 MG PO TABS
4.0000 mg | ORAL_TABLET | Freq: Three times a day (TID) | ORAL | Status: DC | PRN
  Administered 2013-09-25: 4 mg via ORAL
  Filled 2013-09-24: qty 1

## 2013-09-24 MED ORDER — LORAZEPAM 1 MG PO TABS
1.0000 mg | ORAL_TABLET | Freq: Once | ORAL | Status: AC
  Administered 2013-09-24: 1 mg via ORAL
  Filled 2013-09-24: qty 1

## 2013-09-24 MED ORDER — ALUM & MAG HYDROXIDE-SIMETH 200-200-20 MG/5ML PO SUSP: 30.0000 mL | ORAL | Status: DC | PRN

## 2013-09-24 MED ORDER — IBUPROFEN 200 MG PO TABS: 600.0000 mg | ORAL_TABLET | Freq: Three times a day (TID) | ORAL | Status: DC | PRN

## 2013-09-24 NOTE — ED Notes (Signed)
Pt. Arrived into psych ed shaking and very anxious.

## 2013-09-24 NOTE — BH Assessment (Signed)
TTS consult complete.  Athenia Rys, MS, LCASA Assessment Counselor  

## 2013-09-24 NOTE — Telephone Encounter (Signed)
I called again.  Spoke with the patient.  She said she has taken Xanax and would prefer Valium instead.  Rx has been called in.

## 2013-09-24 NOTE — ED Provider Notes (Signed)
CSN: [id number]     Arrival date & time 09/24/13  1342 History   First MD Initiated Contact with Patient 09/24/13 1420     Chief Complaint  Patient presents with  . Medical Clearance     (Consider location/radiation/quality/duration/timing/severity/associated sxs/prior Treatment) HPI Patient presents emergency department with increasing anxiety and the patient, states she's not able to handle the constant, shaking and panic attack feelings.  Patient says she does not want to live like this any longer.  Patient, states, that she was having ECT treatments done.  Patient denies chest pain, shortness of breath, hallucinations, nausea, vomiting, diarrhea, weakness, dizziness, or syncope Past Medical History  Diagnosis Date  . Panic attack   . Depression   . Endometriosis   . Bipolar 1 disorder   . Headache(784.0)   . Personality disorder   . Tuberculosis     been tested positive for it  . COPD (chronic obstructive pulmonary disease)   . Pneumonia complicating pregnancy   . Pneumonia     chronic per lung doctor   Past Surgical History  Procedure Laterality Date  . Appendectomy    . Cholecystectomy    . Abdominal hysterectomy    . Neck surgery    . Cholecystectomy     Family History  Problem Relation Age of Onset  . Kidney cancer Father   . Heart disease Mother    History  Substance Use Topics  . Smoking status: Current Every Day Smoker -- 1.00 packs/day    Types: Cigarettes  . Smokeless tobacco: Never Used  . Alcohol Use: No   OB History   Grav Para Term Preterm Abortions TAB SAB Ect Mult Living                 Review of Systems  All other systems negative except as documented in the HPI. All pertinent positives and negatives as reviewed in the HPI.  Allergies  Flexeril and Morphine and related  Home Medications   Current Outpatient Rx  Name  Route  Sig  Dispense  Refill  . albuterol (PROVENTIL HFA;VENTOLIN HFA) 108 (90 BASE) MCG/ACT inhaler   Inhalation  Inhale 1-2 puffs into the lungs every 4 (four) hours as needed for wheezing.   1 Inhaler   6   . albuterol (PROVENTIL) (2.5 MG/3ML) 0.083% nebulizer solution   Nebulization   Take 2.5 mg by nebulization every 6 (six) hours as needed for wheezing or shortness of breath.         . ALPRAZolam (XANAX) 1 MG tablet      1 mg 3 (three) times daily.          . busPIRone (BUSPAR) 15 MG tablet   Oral   Take 15 mg by mouth 3 (three) times daily.         . mirtazapine (REMERON) 15 MG tablet   Oral   Take 15 mg by mouth at bedtime.         . sertraline (ZOLOFT) 50 MG tablet   Oral   Take 50 mg by mouth daily.         Marland Kitchen terazosin (HYTRIN) 1 MG capsule   Oral   Take 1 mg by mouth at bedtime.         Marland Kitchen tiotropium (SPIRIVA) 18 MCG inhalation capsule   Inhalation   Place 1 capsule (18 mcg total) into inhaler and inhale daily.   30 capsule   6    BP 158/100  Pulse 127  Temp(Src) 98.7 F (37.1 C) (Oral)  Resp 20  SpO2 100% Physical Exam  Constitutional: She is oriented to person, place, and time. She appears well-developed and well-nourished. No distress.  HENT:  Head: Normocephalic and atraumatic.  Mouth/Throat: Oropharynx is clear and moist.  Eyes: Pupils are equal, round, and reactive to light.  Neck: Normal range of motion. Neck supple.  Cardiovascular: Normal rate, regular rhythm and normal heart sounds.   Pulmonary/Chest: Effort normal and breath sounds normal.  Neurological: She is alert and oriented to person, place, and time.  Skin: Skin is warm and dry.  Psychiatric: Her mood appears anxious. Her speech is rapid and/or pressured. She is agitated. She is not hyperactive, not slowed, not withdrawn, not actively hallucinating and not combative. Cognition and memory are not impaired. She does not express impulsivity. She exhibits a depressed mood. She expresses suicidal ideation. She is attentive.    ED Course  Procedures (including critical care time) Labs  Review Labs Reviewed  ACETAMINOPHEN LEVEL  CBC  COMPREHENSIVE METABOLIC PANEL  ETHANOL  SALICYLATE LEVEL  URINE RAPID DRUG SCREEN (HOSP PERFORMED)    Patient be moved back to the psych ED was given a plan and all questions were answered  Carlyle DollyChristopher W Suliman Termini, PA-C 09/24/13 1448

## 2013-09-24 NOTE — BH Assessment (Signed)
Consulted with Psychiatric Extender Donell SievertSpencer Simon who recommends that patient be discharged home to follow-up with her current provider. Consulted with EDP Dr. Devoria AlbeIva Knapp who reports that she will consult with patient.  Glorious PeachNajah Tristan Bramble, MS, LCASA Assessment Counselor

## 2013-09-24 NOTE — ED Notes (Signed)
Pt being sent by [facility] w/ increased PTSD and SI.  Per [facility] stff, "she is just a mess."

## 2013-09-24 NOTE — BH Assessment (Signed)
Consulted with EDP Dr. Lynelle DoctorKnapp to obtain clinicals prior to assessing patient, Dr. Lynelle DoctorKnapp reports that she does not know anything about the patient other than what was noted by provider who charted on patient.   Kara PeachNajah Tag Wurtz, MS, LCASA Assessment Counselor

## 2013-09-24 NOTE — ED Notes (Signed)
Pt was doing shock tx for PTSD, states she cannot do it anymore. Pt is SI, voluntary. Pt states "I can't live like this anymore".

## 2013-09-24 NOTE — ED Notes (Signed)
Pt. States that she is beginning to feel another panic attack coming on, worked with pt. To do deep breathing techniques to aid in decreasing the onset of another attack, pt. Did cooperate in breathing exercises but felt is just wasn't helping enough to stop the attacks,  Notified EDP of pt.'s increasing anxiety, EDP gave new medication orders for pt..Marland Kitchen

## 2013-09-25 ENCOUNTER — Encounter (HOSPITAL_COMMUNITY): Payer: Self-pay | Admitting: *Deleted

## 2013-09-25 DIAGNOSIS — F411 Generalized anxiety disorder: Secondary | ICD-10-CM

## 2013-09-25 MED ORDER — LORAZEPAM 1 MG PO TABS: 1.0000 mg | ORAL_TABLET | Freq: Four times a day (QID) | ORAL | Status: DC | PRN
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
## 2013-09-25 NOTE — ED Provider Notes (Signed)
Medical screening examination/treatment/procedure(s) were performed by non-physician practitioner and as supervising physician I was immediately available for consultation/collaboration.  Evelena Masci T Dartanyon Frankowski, MD 09/25/13 0836 
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
## 2013-09-25 NOTE — Consult Note (Signed)
  Review of Systems  Constitutional: Negative.   HENT: Negative.   Eyes: Negative.   Respiratory: Negative.   Cardiovascular: Negative.   Gastrointestinal: Negative.   Genitourinary: Negative.   Musculoskeletal: Negative.   Skin: Negative.   Neurological: Negative.   Endo/Heme/Allergies: Negative.   Psychiatric/Behavioral: The patient is nervous/anxious.    No medical or physical complaints

## 2013-09-29 ENCOUNTER — Ambulatory Visit
Admission: RE | Admit: 2013-09-29 | Discharge: 2013-09-29 | Disposition: A | Discharge status: Home / Self Care | Source: Ambulatory Visit | Attending: Neurology | Admitting: Neurology

## 2013-09-29 ENCOUNTER — Ambulatory Visit
Admission: RE | Admit: 2013-09-29 | Discharge: 2013-09-29 | Disposition: A | Discharge status: Home / Self Care | Payer: Disability Insurance | Source: Ambulatory Visit | Attending: Neurology | Admitting: Neurology

## 2013-09-29 DIAGNOSIS — G8929 Other chronic pain: Secondary | ICD-10-CM

## 2013-09-29 DIAGNOSIS — F3162 Bipolar disorder, current episode mixed, moderate: Secondary | ICD-10-CM

## 2013-09-29 DIAGNOSIS — G43909 Migraine, unspecified, not intractable, without status migrainosus: Secondary | ICD-10-CM

## 2013-09-29 DIAGNOSIS — R209 Unspecified disturbances of skin sensation: Secondary | ICD-10-CM

## 2013-09-29 DIAGNOSIS — M549 Dorsalgia, unspecified: Secondary | ICD-10-CM

## 2013-09-29 DIAGNOSIS — R202 Paresthesia of skin: Secondary | ICD-10-CM

## 2013-09-29 DIAGNOSIS — R32 Unspecified urinary incontinence: Secondary | ICD-10-CM

## 2013-09-29 DIAGNOSIS — R51 Headache: Secondary | ICD-10-CM

## 2013-09-29 MED ORDER — GADOBENATE DIMEGLUMINE 529 MG/ML IV SOLN
18.0000 mL | Freq: Once | INTRAVENOUS | Status: AC | PRN
  Administered 2013-09-29: 18 mL via INTRAVENOUS

## 2013-10-01 ENCOUNTER — Telehealth: Payer: Self-pay | Admitting: Neurology

## 2013-10-01 NOTE — Telephone Encounter (Signed)
Kara Kelly please call patient, MRI of the brain was normal, MRI of the cervical spine showed multilevel degenerative disc disease, but there is no significant foraminal or canal stenosis.

## 2013-10-02 NOTE — Progress Notes (Signed)
Quick Note:  Shared MR Brain with patient with patient per Dr Zannie CoveYan's findings, she verbalized understanding ______

## 2013-10-03 NOTE — Telephone Encounter (Signed)
Spoke to patient and relayed MRI brain and cervical results, per Dr. Terrace ArabiaYan.

## 2013-10-08 ENCOUNTER — Ambulatory Visit (INDEPENDENT_AMBULATORY_CARE_PROVIDER_SITE_OTHER): Admitting: Family Medicine

## 2013-10-08 ENCOUNTER — Other Ambulatory Visit: Payer: Self-pay | Admitting: *Deleted

## 2013-10-08 ENCOUNTER — Encounter: Payer: Self-pay | Admitting: *Deleted

## 2013-10-08 ENCOUNTER — Encounter: Payer: Self-pay | Admitting: Family Medicine

## 2013-10-08 VITALS — BP 126/78 | HR 76 | Temp 98.2°F | Resp 14 | Ht 68.0 in | Wt 201.0 lb

## 2013-10-08 DIAGNOSIS — R7301 Impaired fasting glucose: Secondary | ICD-10-CM

## 2013-10-08 DIAGNOSIS — M5137 Other intervertebral disc degeneration, lumbosacral region: Secondary | ICD-10-CM

## 2013-10-08 DIAGNOSIS — Z1231 Encounter for screening mammogram for malignant neoplasm of breast: Secondary | ICD-10-CM

## 2013-10-08 DIAGNOSIS — M5136 Other intervertebral disc degeneration, lumbar region: Secondary | ICD-10-CM

## 2013-10-08 DIAGNOSIS — J329 Chronic sinusitis, unspecified: Secondary | ICD-10-CM

## 2013-10-08 DIAGNOSIS — L03317 Cellulitis of buttock: Principal | ICD-10-CM

## 2013-10-08 DIAGNOSIS — L0231 Cutaneous abscess of buttock: Secondary | ICD-10-CM

## 2013-10-08 DIAGNOSIS — J310 Chronic rhinitis: Secondary | ICD-10-CM

## 2013-10-08 DIAGNOSIS — J449 Chronic obstructive pulmonary disease, unspecified: Secondary | ICD-10-CM

## 2013-10-08 DIAGNOSIS — E785 Hyperlipidemia, unspecified: Secondary | ICD-10-CM

## 2013-10-08 DIAGNOSIS — J4489 Other specified chronic obstructive pulmonary disease: Secondary | ICD-10-CM

## 2013-10-08 DIAGNOSIS — F3162 Bipolar disorder, current episode mixed, moderate: Secondary | ICD-10-CM

## 2013-10-08 DIAGNOSIS — M51379 Other intervertebral disc degeneration, lumbosacral region without mention of lumbar back pain or lower extremity pain: Secondary | ICD-10-CM

## 2013-10-08 MED ORDER — CEPHALEXIN 500 MG PO CAPS: 500.0000 mg | ORAL_CAPSULE | Freq: Three times a day (TID) | ORAL | Status: DC

## 2013-10-08 MED ORDER — HYDROCODONE-ACETAMINOPHEN 5-325 MG PO TABS: 1.0000 | ORAL_TABLET | Freq: Four times a day (QID) | ORAL | Status: DC | PRN

## 2013-10-08 MED ORDER — FLUTICASONE PROPIONATE 50 MCG/ACT NA SUSP: 2.0000 | Freq: Every day | NASAL | Status: DC

## 2013-10-08 NOTE — Patient Instructions (Signed)
We will send letter with labs Take antibiotics Use nasal spray Pain meds given Referral to pain clinic for neck F/U 3 months

## 2013-10-09 DIAGNOSIS — M5136 Other intervertebral disc degeneration, lumbar region: Secondary | ICD-10-CM | POA: Insufficient documentation

## 2013-10-09 LAB — CBC WITH DIFFERENTIAL/PLATELET
BASOS ABS: 0 10*3/uL (ref 0.0–0.1)
Basophils Relative: 0 % (ref 0–1)
EOS ABS: 0 10*3/uL (ref 0.0–0.7)
EOS PCT: 0 % (ref 0–5)
HCT: 45.4 % (ref 36.0–46.0)
Hemoglobin: 15.8 g/dL — ABNORMAL HIGH (ref 12.0–15.0)
LYMPHS PCT: 25 % (ref 12–46)
Lymphs Abs: 3.1 10*3/uL (ref 0.7–4.0)
MCH: 32.2 pg (ref 26.0–34.0)
MCHC: 34.8 g/dL (ref 30.0–36.0)
MCV: 92.5 fL (ref 78.0–100.0)
Monocytes Absolute: 0.8 10*3/uL (ref 0.1–1.0)
Monocytes Relative: 6 % (ref 3–12)
Neutro Abs: 8.6 10*3/uL — ABNORMAL HIGH (ref 1.7–7.7)
Neutrophils Relative %: 69 % (ref 43–77)
Platelets: 209 10*3/uL (ref 150–400)
RBC: 4.91 MIL/uL (ref 3.87–5.11)
RDW: 14.3 % (ref 11.5–15.5)
WBC: 12.5 10*3/uL — AB (ref 4.0–10.5)

## 2013-10-09 LAB — COMPREHENSIVE METABOLIC PANEL
ALT: 25 U/L (ref 0–35)
AST: 24 U/L (ref 0–37)
Albumin: 3.9 g/dL (ref 3.5–5.2)
Alkaline Phosphatase: 57 U/L (ref 39–117)
BILIRUBIN TOTAL: 0.5 mg/dL (ref 0.2–1.2)
BUN: 9 mg/dL (ref 6–23)
CHLORIDE: 108 meq/L (ref 96–112)
CO2: 23 mEq/L (ref 19–32)
Calcium: 9.4 mg/dL (ref 8.4–10.5)
Creat: 0.87 mg/dL (ref 0.50–1.10)
Glucose, Bld: 84 mg/dL (ref 70–99)
Potassium: 4.6 mEq/L (ref 3.5–5.3)
Sodium: 139 mEq/L (ref 135–145)
Total Protein: 6.3 g/dL (ref 6.0–8.3)

## 2013-10-09 LAB — LIPID PANEL
Cholesterol: 187 mg/dL (ref 0–200)
HDL: 31 mg/dL — ABNORMAL LOW (ref >39)
LDL CALC: 137 mg/dL — AB (ref 0–99)
Total CHOL/HDL Ratio: 6 Ratio
Triglycerides: 97 mg/dL (ref <150)
VLDL: 19 mg/dL (ref 0–40)

## 2013-10-09 LAB — HEMOGLOBIN A1C
Hgb A1c MFr Bld: 5.7 % — ABNORMAL HIGH (ref <5.7)
Mean Plasma Glucose: 117 mg/dL — ABNORMAL HIGH (ref <117)

## 2013-10-09 NOTE — Assessment & Plan Note (Signed)
Refer to spine specialist, for epidural injection

## 2013-10-09 NOTE — Assessment & Plan Note (Signed)
Doing well on meds 

## 2013-10-09 NOTE — Progress Notes (Signed)
Patient ID: Kara Kelly, female   DOB: 04-20-1966, 48 y.o.   MRN: [id number]   Subjective:    Patient ID: Kara Kelly, female    DOB: 09-27-1965, 48 y.o.   MRN: [id number]  Patient presents for 3 month F/U and hard area to L buttocks near anus  patient here to follow chronic medical problems. She still being seen by psychiatry and has had ECT therapy which she did think improve her however after her last one she had worsening anxiety. She did have a recent medication change as she was seen in the emergency room for medication management.  He complains of a tender spot in her gluteal cleft for the past week she states that she's had since then the past on and off.  COPD-her breathing is doing well she is using her Spiriva as well as the albuterol but very rarely, sinus pressure and draiange x 3 days  She was seen by neurology for her neck pain and paresthesia she was found to have some degenerative changes in her neck and will like to be set up with a spine specialist her pain management to see if injections to possibly done.    Review Of Systems:  GEN- denies fatigue, fever, weight loss,weakness, recent illness HEENT- denies eye drainage, change in vision, nasal discharge, CVS- denies chest pain, palpitations RESP- denies SOB, cough, wheeze ABD- denies N/V, change in stools, abd pain GU- denies dysuria, hematuria, dribbling, incontinence MSK- denies joint pain, muscle aches, injury Neuro- denies headache, dizziness, syncope, seizure activity       Objective:    BP 126/78  Pulse 76  Temp(Src) 98.2 F (36.8 C)  Resp 14  Ht 5\' 8"  (1.727 m)  Wt 201 lb (91.173 kg)  BMI 30.57 kg/m2 GEN- NAD, alert and oriented x3 HEENT- PERRL, EOMI, non injected sclera, pink conjunctiva, MMM, oropharynx clear, TM clear bilat, mild maxillary sinus tenderness, clear rhinorrhea, enlarged turbinates CVS- RRR, no murmur RESP-CTAB EXT- No edema Psych- anxious appearing, not  depressed, well groomed, good eye contact Pulses- Radial 2+ SKIN- left gluteal cleft- small nickle size abscess, +induration, +erythema  Procedure- Incision and Drainage Procedure explained to patient questions answered benefits and risks discussed verbal consent obtained. Antiseptic-Betadine Anesthesia-lidocaine Incision performed large amount of pus expressed Culture taken Minimal blood loss Patient tolerated procedure well 2 inches of packing placed        Assessment & Plan:      Problem List Items Addressed This Visit   Other and unspecified hyperlipidemia - Primary   Relevant Medications      prazosin (MINIPRESS) 1 MG capsule      cloNIDine (CATAPRES) 0.1 MG tablet   Other Relevant Orders      Comprehensive metabolic panel (Completed)      Lipid panel (Completed)   DDD (degenerative disc disease), lumbar     Refer to spine specialist, for epidural injection    Relevant Medications      NORCO 5-325 MG PO TABS   COPD (chronic obstructive pulmonary disease)     Doing well on meds    Relevant Medications      fluticasone (FLONASE) 50 MCG nasal spray   Bipolar 1 disorder, mixed, moderate     Followed by psychiatry, continue meds     Other Visit Diagnoses   Elevated fasting glucose        check A1C    Relevant Orders       CBC with Differential (Completed)  Hemoglobin A1c (Completed)    Cellulitis and abscess of buttock        s/p I & D, start antibiotics, short course pain meds, pull out packing 48hrs    Relevant Orders       Wound culture (Completed)       Note: This dictation was prepared with Dragon dictation along with smaller phrase technology. Any transcriptional errors that result from this process are unintentional.

## 2013-10-09 NOTE — Assessment & Plan Note (Signed)
Followed by psychiatry, continue meds

## 2013-10-11 LAB — WOUND CULTURE: GRAM STAIN: NONE SEEN

## 2013-10-14 ENCOUNTER — Other Ambulatory Visit: Payer: Self-pay | Admitting: *Deleted

## 2013-10-14 MED ORDER — ALBUTEROL SULFATE HFA 108 (90 BASE) MCG/ACT IN AERS: 1.0000 | INHALATION_SPRAY | RESPIRATORY_TRACT | Status: DC | PRN

## 2013-10-14 NOTE — Telephone Encounter (Signed)
Refill appropriate and filled per protocol. 

## 2013-10-23 ENCOUNTER — Ambulatory Visit: Admitting: Neurology

## 2013-10-25 ENCOUNTER — Telehealth: Payer: Self-pay | Admitting: Family Medicine

## 2013-10-25 MED ORDER — HYDROCODONE-ACETAMINOPHEN 5-325 MG PO TABS: 1.0000 | ORAL_TABLET | Freq: Four times a day (QID) | ORAL | Status: DC | PRN

## 2013-10-25 NOTE — Telephone Encounter (Signed)
Okay to refill this 1 time, she has to get further pain meds from her Neurosurgeon/pain doctor for her neck pain I gave her those because of the abscess I drained,

## 2013-10-25 NOTE — Telephone Encounter (Signed)
Prescription printed.   LMTRC.  

## 2013-10-25 NOTE — Telephone Encounter (Signed)
Call back number is [phone] Pt is needing a refill on HYDROcodone-acetaminophen (NORCO) 5-325 MG per tablet Last refill 10/08/13

## 2013-10-25 NOTE — Telephone Encounter (Signed)
Ok to refill??  Last office visit/ refill 10/08/2013.

## 2013-10-28 NOTE — Telephone Encounter (Signed)
LMTRC

## 2013-10-28 NOTE — Telephone Encounter (Signed)
Call placed to patient and patient made aware.  

## 2013-11-04 ENCOUNTER — Emergency Department (HOSPITAL_COMMUNITY)

## 2013-11-04 ENCOUNTER — Emergency Department (HOSPITAL_COMMUNITY)
Admission: EM | Admit: 2013-11-04 | Discharge: 2013-11-05 | Disposition: A | Discharge status: Home / Self Care | Attending: Emergency Medicine | Admitting: Emergency Medicine

## 2013-11-04 ENCOUNTER — Encounter (HOSPITAL_COMMUNITY): Payer: Self-pay | Admitting: Emergency Medicine

## 2013-11-04 DIAGNOSIS — F17200 Nicotine dependence, unspecified, uncomplicated: Secondary | ICD-10-CM | POA: Insufficient documentation

## 2013-11-04 DIAGNOSIS — Z8701 Personal history of pneumonia (recurrent): Secondary | ICD-10-CM | POA: Insufficient documentation

## 2013-11-04 DIAGNOSIS — F319 Bipolar disorder, unspecified: Secondary | ICD-10-CM | POA: Insufficient documentation

## 2013-11-04 DIAGNOSIS — IMO0002 Reserved for concepts with insufficient information to code with codable children: Secondary | ICD-10-CM | POA: Insufficient documentation

## 2013-11-04 DIAGNOSIS — R109 Unspecified abdominal pain: Secondary | ICD-10-CM | POA: Insufficient documentation

## 2013-11-04 DIAGNOSIS — Z8611 Personal history of tuberculosis: Secondary | ICD-10-CM | POA: Insufficient documentation

## 2013-11-04 DIAGNOSIS — N83209 Unspecified ovarian cyst, unspecified side: Secondary | ICD-10-CM | POA: Insufficient documentation

## 2013-11-04 DIAGNOSIS — F410 Panic disorder [episodic paroxysmal anxiety] without agoraphobia: Secondary | ICD-10-CM | POA: Insufficient documentation

## 2013-11-04 DIAGNOSIS — J441 Chronic obstructive pulmonary disease with (acute) exacerbation: Secondary | ICD-10-CM | POA: Insufficient documentation

## 2013-11-04 DIAGNOSIS — R350 Frequency of micturition: Secondary | ICD-10-CM | POA: Insufficient documentation

## 2013-11-04 DIAGNOSIS — J449 Chronic obstructive pulmonary disease, unspecified: Secondary | ICD-10-CM

## 2013-11-04 DIAGNOSIS — J189 Pneumonia, unspecified organism: Secondary | ICD-10-CM

## 2013-11-04 DIAGNOSIS — J159 Unspecified bacterial pneumonia: Secondary | ICD-10-CM | POA: Insufficient documentation

## 2013-11-04 DIAGNOSIS — Z79899 Other long term (current) drug therapy: Secondary | ICD-10-CM | POA: Insufficient documentation

## 2013-11-04 LAB — URINALYSIS, ROUTINE W REFLEX MICROSCOPIC
Bilirubin Urine: NEGATIVE
Glucose, UA: NEGATIVE mg/dL
Hgb urine dipstick: NEGATIVE
KETONES UR: NEGATIVE mg/dL
LEUKOCYTES UA: NEGATIVE
Nitrite: NEGATIVE
Protein, ur: NEGATIVE mg/dL
Specific Gravity, Urine: 1.008 (ref 1.005–1.030)
Urobilinogen, UA: 1 mg/dL (ref 0.0–1.0)
pH: 7.5 (ref 5.0–8.0)

## 2013-11-04 LAB — HEPATIC FUNCTION PANEL
ALT: 17 U/L (ref 0–35)
AST: 21 U/L (ref 0–37)
Albumin: 3.3 g/dL — ABNORMAL LOW (ref 3.5–5.2)
Alkaline Phosphatase: 68 U/L (ref 39–117)
BILIRUBIN TOTAL: 0.4 mg/dL (ref 0.3–1.2)
Total Protein: 6.2 g/dL (ref 6.0–8.3)

## 2013-11-04 LAB — BASIC METABOLIC PANEL
BUN: 6 mg/dL (ref 6–23)
CHLORIDE: 102 meq/L (ref 96–112)
CO2: 25 mEq/L (ref 19–32)
Calcium: 9.1 mg/dL (ref 8.4–10.5)
Creatinine, Ser: 0.82 mg/dL (ref 0.50–1.10)
GFR calc non Af Amer: 85 mL/min — ABNORMAL LOW (ref >90)
Glucose, Bld: 103 mg/dL — ABNORMAL HIGH (ref 70–99)
POTASSIUM: 4.2 meq/L (ref 3.7–5.3)
SODIUM: 141 meq/L (ref 137–147)

## 2013-11-04 LAB — PRO B NATRIURETIC PEPTIDE: Pro B Natriuretic peptide (BNP): 372.7 pg/mL — ABNORMAL HIGH (ref 0–125)

## 2013-11-04 LAB — CBC
HCT: 39.7 % (ref 36.0–46.0)
Hemoglobin: 13.9 g/dL (ref 12.0–15.0)
MCH: 33.2 pg (ref 26.0–34.0)
MCHC: 35 g/dL (ref 30.0–36.0)
MCV: 94.7 fL (ref 78.0–100.0)
Platelets: 194 10*3/uL (ref 150–400)
RBC: 4.19 MIL/uL (ref 3.87–5.11)
RDW: 14.7 % (ref 11.5–15.5)
WBC: 11.1 10*3/uL — AB (ref 4.0–10.5)

## 2013-11-04 LAB — I-STAT TROPONIN, ED: Troponin i, poc: 0 ng/mL (ref 0.00–0.08)

## 2013-11-04 LAB — LIPASE, BLOOD: Lipase: 35 U/L (ref 11–59)

## 2013-11-04 MED ORDER — OXYCODONE-ACETAMINOPHEN 5-325 MG PO TABS
1.0000 | ORAL_TABLET | Freq: Once | ORAL | Status: AC
  Administered 2013-11-04: 1 via ORAL
  Filled 2013-11-04: qty 1

## 2013-11-04 MED ORDER — ALBUTEROL SULFATE (2.5 MG/3ML) 0.083% IN NEBU
5.0000 mg | INHALATION_SOLUTION | Freq: Once | RESPIRATORY_TRACT | Status: AC
  Administered 2013-11-04: 5 mg via RESPIRATORY_TRACT
  Filled 2013-11-04: qty 6

## 2013-11-04 MED ORDER — IPRATROPIUM BROMIDE 0.02 % IN SOLN
0.5000 mg | Freq: Once | RESPIRATORY_TRACT | Status: AC
  Administered 2013-11-04: 0.5 mg via RESPIRATORY_TRACT
  Filled 2013-11-04: qty 2.5

## 2013-11-04 MED ORDER — PREDNISONE 20 MG PO TABS
60.0000 mg | ORAL_TABLET | Freq: Once | ORAL | Status: AC
  Administered 2013-11-04: 60 mg via ORAL
  Filled 2013-11-04: qty 3

## 2013-11-04 NOTE — ED Notes (Signed)
Pt. reports mid chest tightness with productive cough onset last week worse with deep inspiration , pt. also reported left flank pain onset yesterday denies hematuria.

## 2013-11-04 NOTE — ED Provider Notes (Signed)
CSN: [id number]     Arrival date & time 11/04/13  2103 History   First MD Initiated Contact with Patient 11/04/13 2244     Chief Complaint  Patient presents with  . Chest Pain  . Flank Pain     (Consider location/radiation/quality/duration/timing/severity/associated sxs/prior Treatment) HPI Kara Kelly is a 48 y.o. female who presents emergency department complaining of chest tightness and shortness of breath, onset 1 week ago. States progressively has been getting worse. Patient reports nasal congestion, sore throat, coughing. States she's coughing up thick mucus. States she has been using her inhaler and neb treatments. Today she states she used her inhaler once, after he didn't work she used to nebulizer machine. She states she used her machine 3 times, last used at 3 PM. States she received no relief after the breathing treatments. She states she's wheezing. She states her chest feels tight. She denies any recent travel or surgeries. Denies any lower extremity swelling. History of COPD. She also states that yesterday she developed severe left flank pain. States the pain radiates around her abdomen into the left groin. She admits to urinary frequency and urgency, denies dysuria or hematuria. No history of kidney stones. No fever, states she has been having chills. No nausea, vomiting, diarrhea.  Past Medical History  Diagnosis Date  . Panic attack   . Depression   . Endometriosis   . Bipolar 1 disorder   . Headache(784.0)   . Personality disorder   . Tuberculosis     been tested positive for it  . COPD (chronic obstructive pulmonary disease)   . Pneumonia complicating pregnancy   . Pneumonia     chronic per lung doctor   Past Surgical History  Procedure Laterality Date  . Appendectomy    . Cholecystectomy    . Abdominal hysterectomy    . Neck surgery    . Cholecystectomy     Family History  Problem Relation Age of Onset  . Kidney cancer Father   . Heart disease  Mother    History  Substance Use Topics  . Smoking status: Current Every Day Smoker -- 1.00 packs/day    Types: Cigarettes  . Smokeless tobacco: Never Used  . Alcohol Use: No   OB History   Grav Para Term Preterm Abortions TAB SAB Ect Mult Living                 Review of Systems  Constitutional: Positive for chills. Negative for fever.  HENT: Positive for congestion and sore throat.   Respiratory: Positive for cough and chest tightness. Negative for shortness of breath.   Cardiovascular: Positive for chest pain. Negative for palpitations and leg swelling.  Gastrointestinal: Positive for abdominal pain. Negative for nausea, vomiting and diarrhea.  Genitourinary: Positive for flank pain. Negative for dysuria, vaginal bleeding, vaginal discharge, vaginal pain and pelvic pain.  Musculoskeletal: Negative for arthralgias, myalgias, neck pain and neck stiffness.  Skin: Negative for rash.  Neurological: Negative for dizziness, weakness and headaches.  All other systems reviewed and are negative.      Allergies  Flexeril and Morphine and related  Home Medications   Current Outpatient Rx  Name  Route  Sig  Dispense  Refill  . albuterol (PROVENTIL HFA;VENTOLIN HFA) 108 (90 BASE) MCG/ACT inhaler   Inhalation   Inhale 1-2 puffs into the lungs every 4 (four) hours as needed for wheezing.   1 Inhaler   6   . albuterol (PROVENTIL) (2.5 MG/3ML) 0.083% nebulizer solution  Nebulization   Take 2.5 mg by nebulization every 6 (six) hours as needed for wheezing or shortness of breath.         . cloNIDine (CATAPRES) 0.1 MG tablet   Oral   Take 0.1 mg by mouth 2 (two) times daily.         Marland Kitchen. FLUoxetine (PROZAC) 40 MG capsule   Oral   Take 40 mg by mouth daily.         . fluticasone (FLONASE) 50 MCG/ACT nasal spray   Each Nare   Place 2 sprays into both nostrils daily.   16 g   1   . HYDROcodone-acetaminophen (NORCO) 5-325 MG per tablet   Oral   Take 1 tablet by mouth  every 6 (six) hours as needed for moderate pain.   20 tablet   0   . prazosin (MINIPRESS) 1 MG capsule   Oral   Take 1 mg by mouth at bedtime.         Marland Kitchen. tiotropium (SPIRIVA) 18 MCG inhalation capsule   Inhalation   Place 1 capsule (18 mcg total) into inhaler and inhale daily.   30 capsule   6    BP 123/84  Pulse 89  Temp(Src) 98.2 F (36.8 C) (Oral)  Resp 18  SpO2 98% Physical Exam  Nursing note and vitals reviewed. Constitutional: She appears well-developed and well-nourished. No distress.  HENT:  Head: Normocephalic.  Eyes: Conjunctivae are normal.  Neck: Neck supple.  Cardiovascular: Normal rate, regular rhythm and normal heart sounds.   Pulmonary/Chest: Effort normal. No respiratory distress. She has wheezes. She has no rales.  Diffuse wheezing bilaterally.  Abdominal: Soft. Bowel sounds are normal. She exhibits no distension. There is no tenderness. There is no rebound.  Left upper quadrant left lower quadrant abdominal tenderness, left CVA tenderness.  Musculoskeletal: She exhibits no edema.  Neurological: She is alert.  Skin: Skin is warm and dry.  Psychiatric: She has a normal mood and affect. Her behavior is normal.    ED Course  Procedures (including critical care time) Labs Review Labs Reviewed  CBC - Abnormal; Notable for the following:    WBC 11.1 (*)    All other components within normal limits  BASIC METABOLIC PANEL - Abnormal; Notable for the following:    Glucose, Bld 103 (*)    GFR calc non Af Amer 85 (*)    All other components within normal limits  PRO B NATRIURETIC PEPTIDE - Abnormal; Notable for the following:    Pro B Natriuretic peptide (BNP) 372.7 (*)    All other components within normal limits  URINALYSIS, ROUTINE W REFLEX MICROSCOPIC  I-STAT TROPOININ, ED   Imaging Review Dg Chest 2 View  11/04/2013   CLINICAL DATA:  Chest and flank pain.  EXAM: CHEST  2 VIEW  COMPARISON:  DG CHEST 2 VIEW dated 05/05/2013  FINDINGS: Cardiac and  mediastinal silhouette is unremarkable unchanged. Slightly increased lung volumes with minimally coarsened pulmonary interstitium, improved from prior examination. No pleural effusions or focal consolidations. Trachea projects midline and there is no pneumothorax.  Partially characterized ACDF. Surgical clips in the right abdomen likely reflect cholecystectomy. Chronic T12 compression fracture results in accentuated thoracolumbar kyphosis, similar.  IMPRESSION: Probable COPD without superimposed acute cardiopulmonary process.   Electronically Signed   By: Awilda Metroourtnay  Bloomer   On: 11/04/2013 22:44     EKG Interpretation None      MDM   Final diagnoses:  CAP (community acquired pneumonia)  COPD (  chronic obstructive pulmonary disease)  Ovarian cyst    Patient with cough, shortness of breath, wheezing, left flank pain. She has history of COPD, she is a current smoker. She is currently taking Spiriva, albuterol at home with no relief. Exam showed diffuse wheezing bilaterally. Will start nebs, chest x-ray, labs ordered. Given the left flank pain and tenderness, UR analysis, lipase, CT abdomen and pelvis ordered.   1:14 AM patient's chest x-ray is negative. Labs unremarkable. She is feeling better after the 3 neb treatments. She is very anxious and received Ativan which has improved her anxiety. She also received 60 mg of prednisone by mouth. Her wheezing has improved. She states she's breathing much better. Her LFTs, lipase negative. CT scan did not show any signs of left-sided kidney stone or hydronephrosis. It did show possible lower lobe pneumonia versus atelectasis. Given patient's symptoms will cover with Zithromax. Her urinalysis is completely normal. Her CT scan did show several findings which I think are incidental, and are described above. I did discuss these findings with patient and instructed her to followup closely for recheck. At this time I do not think any further emergent past her images  are needed. She is feeling much better and ready for discharge home.  Home with prednisone taper, inhaler/neb treatments, zithromax, Hycodan. She is to followup closely.  Filed Vitals:   11/04/13 2118  BP: 123/84  Pulse: 89  Temp: 98.2 F (36.8 C)  TempSrc: Oral  Resp: 18  SpO2: 98%     Lottie Mussel, PA-C 11/05/13 0116

## 2013-11-05 MED ORDER — KETOROLAC TROMETHAMINE 60 MG/2ML IM SOLN
60.0000 mg | Freq: Once | INTRAMUSCULAR | Status: AC
  Administered 2013-11-05: 60 mg via INTRAMUSCULAR
  Filled 2013-11-05: qty 2

## 2013-11-05 MED ORDER — HYDROCODONE-HOMATROPINE 5-1.5 MG/5ML PO SYRP: 5.0000 mL | ORAL_SOLUTION | Freq: Four times a day (QID) | ORAL | Status: DC | PRN

## 2013-11-05 MED ORDER — AZITHROMYCIN 250 MG PO TABS: 250.0000 mg | ORAL_TABLET | Freq: Every day | ORAL | Status: DC

## 2013-11-05 MED ORDER — ALBUTEROL SULFATE (2.5 MG/3ML) 0.083% IN NEBU
5.0000 mg | INHALATION_SOLUTION | Freq: Once | RESPIRATORY_TRACT | Status: AC
  Administered 2013-11-05: 5 mg via RESPIRATORY_TRACT
  Filled 2013-11-05: qty 6

## 2013-11-05 MED ORDER — PREDNISONE 10 MG PO TABS: ORAL_TABLET | ORAL | Status: DC

## 2013-11-05 MED ORDER — LORAZEPAM 1 MG PO TABS
1.0000 mg | ORAL_TABLET | Freq: Once | ORAL | Status: AC
  Administered 2013-11-05: 1 mg via ORAL
  Filled 2013-11-05: qty 1

## 2013-11-05 NOTE — ED Provider Notes (Signed)
Medical screening examination/treatment/procedure(s) were performed by non-physician practitioner and as supervising physician I was immediately available for consultation/collaboration.   EKG Interpretation None        Julie Manly, MD 11/05/13 0735 

## 2013-11-05 NOTE — Discharge Instructions (Signed)
Take azithromycin as prescribed until all gone. Take prednisone as prescribed until all gone. Take Hycodan as prescribed as needed for cough and pain. I would smoking. Your CT scan showed a fist on the right ovary, this will need to be further evaluated by an OB/GYN or her primary care Dr. Please followup with them within a week. Do nebulized treatments every 3-4 hours. Followup closely.  Chronic Obstructive Pulmonary Disease Chronic obstructive pulmonary disease (COPD) is a common lung condition in which airflow from the lungs is limited. COPD is a general term that can be used to describe many different lung problems that limit airflow, including both chronic bronchitis and emphysema. If you have COPD, your lung function will probably never return to normal, but there are measures you can take to improve lung function and make yourself feel better.  CAUSES   Smoking (common).   Exposure to secondhand smoke.   Genetic problems.  Chronic inflammatory lung diseases or recurrent infections. SYMPTOMS   Shortness of breath, especially with physical activity.   Deep, persistent (chronic) cough with a large amount of thick mucus.   Wheezing.   Rapid breaths (tachypnea).   Gray or bluish discoloration (cyanosis) of the skin, especially in fingers, toes, or lips.   Fatigue.   Weight loss.   Frequent infections or episodes when breathing symptoms become much worse (exacerbations).   Chest tightness. DIAGNOSIS  Your healthcare provider will take a medical history and perform a physical examination to make the initial diagnosis. Additional tests for COPD may include:   Lung (pulmonary) function tests.  Chest X-ray.  CT scan.  Blood tests. TREATMENT  Treatment available to help you feel better when you have COPD include:   Inhaler and nebulizer medicines. These help manage the symptoms of COPD and make your breathing more comfortable  Supplemental oxygen. Supplemental  oxygen is only helpful if you have a low oxygen level in your blood.   Exercise and physical activity. These are beneficial for nearly all people with COPD. Some people may also benefit from a pulmonary rehabilitation program. HOME CARE INSTRUCTIONS   Take all medicines (inhaled or pills) as directed by your health care provider.  Only take over-the-counter or prescription medicines for pain, fever, or discomfort as directed by your health care provider.   Avoid over-the-counter medicines or cough syrups that dry up your airway (such as antihistamines) and slow down the elimination of secretions unless instructed otherwise by your healthcare provider.   If you are a smoker, the most important thing that you can do is stop smoking. Continuing to smoke will cause further lung damage and breathing trouble. Ask your health care provider for help with quitting smoking. He or she can direct you to community resources or hospitals that provide support.  Avoid exposure to irritants such as smoke, chemicals, and fumes that aggravate your breathing.  Use oxygen therapy and pulmonary rehabilitation if directed by your health care provider. If you require home oxygen therapy, ask your healthcare provider whether you should purchase a pulse oximeter to measure your oxygen level at home.   Avoid contact with individuals who have a contagious illness.  Avoid extreme temperature and humidity changes.  Eat healthy foods. Eating smaller, more frequent meals and resting before meals may help you maintain your strength.  Stay active, but balance activity with periods of rest. Exercise and physical activity will help you maintain your ability to do things you want to do.  Preventing infection and hospitalization is very  important when you have COPD. Make sure to receive all the vaccines your health care provider recommends, especially the pneumococcal and influenza vaccines. Ask your healthcare provider  whether you need a pneumonia vaccine.  Learn and use relaxation techniques to manage stress.  Learn and use controlled breathing techniques as directed by your health care provider. Controlled breathing techniques include:   Pursed lip breathing. Start by breathing in (inhaling) through your nose for 1 second. Then, purse your lips as if you were going to whistle and breathe out (exhale) through the pursed lips for 2 seconds.   Diaphragmatic breathing. Start by putting one hand on your abdomen just above your waist. Inhale slowly through your nose. The hand on your abdomen should move out. Then purse your lips and exhale slowly. You should be able to feel the hand on your abdomen moving in as you exhale.   Learn and use controlled coughing to clear mucus from your lungs. Controlled coughing is a series of short, progressive coughs. The steps of controlled coughing are:  1. Lean your head slightly forward.  2. Breathe in deeply using diaphragmatic breathing.  3. Try to hold your breath for 3 seconds.  4. Keep your mouth slightly open while coughing twice.  5. Spit any mucus out into a tissue.  6. Rest and repeat the steps once or twice as needed. SEEK MEDICAL CARE IF:   You are coughing up more mucus than usual.   There is a change in the color or thickness of your mucus.   Your breathing is more labored than usual.   Your breathing is faster than usual.  SEEK IMMEDIATE MEDICAL CARE IF:   You have shortness of breath while you are resting.   You have shortness of breath that prevents you from:  Being able to talk.   Performing your usual physical activities.   You have chest pain lasting longer than 5 minutes.   Your skin color is more cyanotic than usual.  You measure low oxygen saturations for longer than 5 minutes with a pulse oximeter. MAKE SURE YOU:   Understand these instructions.  Will watch your condition.  Will get help right away if you are not  doing well or get worse. Document Released: 04/27/2005 Document Revised: 05/08/2013 Document Reviewed: 03/14/2013 [facility] Patient Information 2014 [place], [place].

## 2013-11-06 ENCOUNTER — Ambulatory Visit: Admitting: Family Medicine

## 2013-11-07 ENCOUNTER — Telehealth: Payer: Self-pay | Admitting: *Deleted

## 2013-11-07 ENCOUNTER — Ambulatory Visit (INDEPENDENT_AMBULATORY_CARE_PROVIDER_SITE_OTHER): Admitting: Physician Assistant

## 2013-11-07 ENCOUNTER — Encounter: Payer: Self-pay | Admitting: Physician Assistant

## 2013-11-07 VITALS — BP 126/80 | HR 84 | Temp 98.3°F | Resp 18 | Ht 67.5 in | Wt 202.0 lb

## 2013-11-07 DIAGNOSIS — J209 Acute bronchitis, unspecified: Secondary | ICD-10-CM

## 2013-11-07 DIAGNOSIS — J441 Chronic obstructive pulmonary disease with (acute) exacerbation: Secondary | ICD-10-CM

## 2013-11-07 MED ORDER — PREDNISONE 20 MG PO TABS: ORAL_TABLET | ORAL | Status: DC

## 2013-11-07 MED ORDER — METHYLPREDNISOLONE ACETATE 80 MG/ML IJ SUSP
80.0000 mg | Freq: Once | INTRAMUSCULAR | Status: AC
  Administered 2013-11-07: 80 mg via INTRAMUSCULAR

## 2013-11-07 MED ORDER — HYDROCODONE-HOMATROPINE 5-1.5 MG/5ML PO SYRP: 5.0000 mL | ORAL_SOLUTION | Freq: Four times a day (QID) | ORAL | Status: DC | PRN

## 2013-11-07 NOTE — Telephone Encounter (Signed)
Received call from patient.  Reported that she went to [facility] to have Hycodan refilled, but pharmacy would not refill stating that it is too soon to refill.   Reports that she told PA that she was taking medication more often than prescribed. Pharmacy states office would need to call in to approve refill.   Ok to call?

## 2013-11-07 NOTE — Telephone Encounter (Signed)
Per provider.  Pt stated she was using Hycodan Q4hrs and not Q6hr as prescribed.  At that rate the Rx should last for 4 days.  Pharmacy told she can have refill at end of 4 th day and not before.

## 2013-11-07 NOTE — Telephone Encounter (Signed)
Discussed this with staff here. Staff has  discussed with pharmacy. Agree It is too soon to be filling this. Denied refill now.

## 2013-11-07 NOTE — Progress Notes (Signed)
Patient ID: Kara Kelly MRN: [id number], DOB: Jan 03, 1966, 48 y.o. Date of Encounter: 11/07/2013, 2:31 PM    Chief Complaint:  Chief Complaint  Patient presents with  . ED follow up    pneumonia, ? cyst on ovary     HPI: 48 y.o. year old white female here for followup.I  Have reviewed her ER encounter from 11/04/13.  At That time she complained of chest tightness and shortness of breath-- onset one week prior. Stated it had progressively been worsening. Reported some nasal congestion, sore throat, coughing. Was coughing up thick phlegm. Had been using her inhaler and nebulizer treatments. On the day of the ER visit she had used her inhaler once. After it didn't work she used her nebulizer. She used her machine 3 times but received no relief so she went to the emergency room. She reported that she had been wheezing in her chest felt tight. As noted she had a history of COPD and ongoing smoking. Time of the ER visit she is reported some left flank pain so that was also evaluated. However she is not reporting any such pain today.  The ER she had a chest x-ray and also a CT of the abdomen and pelvis. CT of the abdomen and pelvis showed no abnormality to explain the pain on the left. Also performed urinalysis, CBC, p.m. ET, BMP, LFT, a lipase. These were all normal.  Chest x-ray was negative. In the ER she was given 3 nebulizer treatments. She also received prednisone 60 mg orally. She was discharged home with an oral prednisone taper, inhaler/nebulizer treatments, Zithromax, Hycodan.  Day she reports that she has been taking antibiotic and prednisone taper as directed. Continue to use her nebulizer every 4 hours. Also has been using the Hycodan regularly.  SHe comes in today because her chest still feels tight and she is still wheezing. Has Had known documented fevers.     Home Meds: See attached medication section for any medications that were entered at today's visit. The  computer does not put those onto this list.The following list is a list of meds entered prior to today's visit.   Current Outpatient Prescriptions on File Prior to Visit  Medication Sig Dispense Refill  . albuterol (PROVENTIL HFA;VENTOLIN HFA) 108 (90 BASE) MCG/ACT inhaler Inhale 1-2 puffs into the lungs every 4 (four) hours as needed for wheezing.  1 Inhaler  6  . albuterol (PROVENTIL) (2.5 MG/3ML) 0.083% nebulizer solution Take 2.5 mg by nebulization every 6 (six) hours as needed for wheezing or shortness of breath.      Marland Kitchen azithromycin (ZITHROMAX) 250 MG tablet Take 1 tablet (250 mg total) by mouth daily. Take first 2 tablets together, then 1 every day until finished.  6 tablet  0  . cloNIDine (CATAPRES) 0.1 MG tablet Take 0.1 mg by mouth 2 (two) times daily.      Marland Kitchen FLUoxetine (PROZAC) 40 MG capsule Take 40 mg by mouth daily.      . fluticasone (FLONASE) 50 MCG/ACT nasal spray Place 2 sprays into both nostrils daily.  16 g  1  . prazosin (MINIPRESS) 1 MG capsule Take 1 mg by mouth at bedtime.      . predniSONE (DELTASONE) 10 MG tablet Take 5 tab day 1, take 4 tab day 2, take 3 tab day 3, take 2 tab day 4, and take 1 tab day 5  15 tablet  0  . tiotropium (SPIRIVA) 18 MCG inhalation capsule Place 1 capsule (  18 mcg total) into inhaler and inhale daily.  30 capsule  6  . HYDROcodone-acetaminophen (NORCO) 5-325 MG per tablet Take 1 tablet by mouth every 6 (six) hours as needed for moderate pain.  20 tablet  0   No current facility-administered medications on file prior to visit.    Allergies:  Allergies  Allergen Reactions  . Flexeril [Cyclobenzaprine] Other (See Comments)    Doesn't like the way it makes her feel.   . Morphine And Related Other (See Comments)    Alters mental status: Makes violent      Review of Systems: See HPI for pertinent ROS. All other ROS negative.    Physical Exam: Blood pressure 126/80, pulse 84, temperature 98.3 F (36.8 C), temperature source Oral, resp. rate  18, height 5' 7.5" (1.715 m), weight 202 lb (91.627 kg)., Body mass index is 31.15 kg/(m^2).  SaO2 on RA is 98% General:  WNWD WF. Appears in no acute distress. HEENT: Normocephalic, atraumatic, eyes without discharge, sclera non-icteric, nares are without discharge. Bilateral auditory canals clear, TM's are without perforation, pearly grey and translucent with reflective cone of light bilaterally. Oral cavity moist, posterior pharynx without exudate, erythema, peritonsillar abscess.  Neck: Supple. No thyromegaly. No lymphadenopathy. Lungs: SHe has harsh wheezes bilaterally throughout. Heart: Regular rhythm. No murmurs, rubs, or gallops. Msk:  Strength and tone normal for age. Extremities/Skin: Warm and dry. Neuro: Alert and oriented X 3. Moves all extremities spontaneously. Gait is normal. CNII-XII grossly in tact. Psych:  Responds to questions appropriately with a normal affect.     ASSESSMENT AND PLAN:  48 y.o. year old female with  1. Acute bronchitis  Give Depo-Medrol 80 mg IM now. SHe will start the prednisone taper are listed below starting tomorrow. Complete the azithromycin that was prescribed by the ER. Continue using albuterol nebulizers every 4 hours. SHe is also requesting a refill on her Hycodan so I have printed her a prescription for that. F/U if  breathing worsens or is not improving over the next 48 hours.  - predniSONE (DELTASONE) 20 MG tablet; Take 3 daily for 2 days, then 2 daily for 2 days, then 1 daily for 2 days.  Dispense: 12 tablet; Refill: 0 - HYDROcodone-homatropine (HYCODAN) 5-1.5 MG/5ML syrup; Take 5 mLs by mouth every 6 (six) hours as needed for cough.  Dispense: 120 mL; Refill: 0  2. COPD exacerbation - predniSONE (DELTASONE) 20 MG tablet; Take 3 daily for 2 days, then 2 daily for 2 days, then 1 daily for 2 days.  Dispense: 12 tablet; Refill: 0   Signed, [address]Mary Beth Sal[place] Creek[place]ixon, [place],  11/07/2013 2:31 PM

## 2013-11-07 NOTE — Addendum Note (Signed)
Addended by: Donne AnonPLUMMER, Hessie Varone M on: 11/07/2013 02:44 PM   Modules accepted: Orders

## 2013-11-18 ENCOUNTER — Emergency Department (HOSPITAL_COMMUNITY)

## 2013-11-18 ENCOUNTER — Ambulatory Visit (INDEPENDENT_AMBULATORY_CARE_PROVIDER_SITE_OTHER): Admitting: Physician Assistant

## 2013-11-18 ENCOUNTER — Encounter: Payer: Self-pay | Admitting: Physician Assistant

## 2013-11-18 ENCOUNTER — Encounter (HOSPITAL_COMMUNITY): Payer: Self-pay | Admitting: Emergency Medicine

## 2013-11-18 ENCOUNTER — Emergency Department (HOSPITAL_COMMUNITY)
Admission: EM | Admit: 2013-11-18 | Discharge: 2013-11-18 | Discharge status: Against Medical Advice | Attending: Emergency Medicine | Admitting: Emergency Medicine

## 2013-11-18 VITALS — BP 132/96 | HR 88 | Temp 98.2°F | Resp 20 | Wt 206.0 lb

## 2013-11-18 DIAGNOSIS — J449 Chronic obstructive pulmonary disease, unspecified: Secondary | ICD-10-CM

## 2013-11-18 DIAGNOSIS — J4489 Other specified chronic obstructive pulmonary disease: Secondary | ICD-10-CM | POA: Insufficient documentation

## 2013-11-18 DIAGNOSIS — F17200 Nicotine dependence, unspecified, uncomplicated: Secondary | ICD-10-CM | POA: Insufficient documentation

## 2013-11-18 DIAGNOSIS — R0602 Shortness of breath: Secondary | ICD-10-CM | POA: Insufficient documentation

## 2013-11-18 DIAGNOSIS — Z8611 Personal history of tuberculosis: Secondary | ICD-10-CM | POA: Insufficient documentation

## 2013-11-18 DIAGNOSIS — J441 Chronic obstructive pulmonary disease with (acute) exacerbation: Secondary | ICD-10-CM

## 2013-11-18 DIAGNOSIS — Z8701 Personal history of pneumonia (recurrent): Secondary | ICD-10-CM | POA: Insufficient documentation

## 2013-11-18 DIAGNOSIS — J209 Acute bronchitis, unspecified: Secondary | ICD-10-CM

## 2013-11-18 LAB — I-STAT TROPONIN, ED: Troponin i, poc: 0 ng/mL (ref 0.00–0.08)

## 2013-11-18 LAB — CBC
HCT: 41.1 % (ref 36.0–46.0)
Hemoglobin: 13.6 g/dL (ref 12.0–15.0)
MCH: 32.2 pg (ref 26.0–34.0)
MCHC: 33.1 g/dL (ref 30.0–36.0)
MCV: 97.4 fL (ref 78.0–100.0)
Platelets: 300 K/uL (ref 150–400)
RBC: 4.22 MIL/uL (ref 3.87–5.11)
RDW: 15.5 % (ref 11.5–15.5)
WBC: 10 K/uL (ref 4.0–10.5)

## 2013-11-18 LAB — BASIC METABOLIC PANEL
BUN: 11 mg/dL (ref 6–23)
CALCIUM: 9.5 mg/dL (ref 8.4–10.5)
CHLORIDE: 107 meq/L (ref 96–112)
CO2: 25 meq/L (ref 19–32)
CREATININE: 0.73 mg/dL (ref 0.50–1.10)
GFR calc Af Amer: >90 mL/min (ref >90)
GFR calc non Af Amer: >90 mL/min (ref >90)
Glucose, Bld: 86 mg/dL (ref 70–99)
Potassium: 4.4 mEq/L (ref 3.7–5.3)
Sodium: 144 mEq/L (ref 137–147)

## 2013-11-18 MED ORDER — LEVOFLOXACIN 750 MG PO TABS: 750.0000 mg | ORAL_TABLET | Freq: Every day | ORAL | Status: DC

## 2013-11-18 MED ORDER — ALBUTEROL SULFATE (2.5 MG/3ML) 0.083% IN NEBU
INHALATION_SOLUTION | RESPIRATORY_TRACT | Status: AC
  Filled 2013-11-18: qty 6

## 2013-11-18 MED ORDER — METHYLPREDNISOLONE ACETATE 80 MG/ML IJ SUSP
80.0000 mg | Freq: Once | INTRAMUSCULAR | Status: AC
  Administered 2013-11-18: 80 mg via INTRAMUSCULAR

## 2013-11-18 MED ORDER — ALBUTEROL SULFATE (2.5 MG/3ML) 0.083% IN NEBU
5.0000 mg | INHALATION_SOLUTION | Freq: Once | RESPIRATORY_TRACT | Status: AC
  Administered 2013-11-18: 5 mg via RESPIRATORY_TRACT

## 2013-11-18 MED ORDER — PREDNISONE 20 MG PO TABS: ORAL_TABLET | ORAL | Status: DC

## 2013-11-18 NOTE — ED Notes (Signed)
Pt came up to registration and stated that she needed to leave. RN attempted to convince patient that it was in her best interest to stay. Pt stated that she needed to go home. Pt will be d/c LWBS after triage.

## 2013-11-18 NOTE — Progress Notes (Signed)
Patient ID: Trinna PostShelia Sartin Linnabery MRN: [id number], DOB: 1966-04-02, 48 y.o. Date of Encounter: 11/18/2013, 5:00 PM    Chief Complaint:  Chief Complaint  Patient presents with  . getting worse    diff breathing     HPI: 48 y.o. year old white female here for followup She was seen in  ER encounter  11/04/13.  At That time she complained of chest tightness and shortness of breath-- onset one week prior. Stated it had progressively been worsening. Reported some nasal congestion, sore throat, coughing. Was coughing up thick phlegm. Had been using her inhaler and nebulizer treatments. On the day of the ER visit she had used her inhaler once. After it didn't work she used her nebulizer. She used her machine 3 times but received no relief so she went to the emergency room. She reported that she had been wheezing in her chest felt tight. Also noted she had a history of COPD and ongoing smoking.  The ER she had a chest x-ray. Chest x-ray was negative. In the ER she was given 3 nebulizer treatments. She also received prednisone 60 mg orally. She was discharged home with an oral prednisone taper, inhaler/nebulizer treatments, Zithromax, Hycodan.  She had f/u OV with me 11/07/13. At that OV  she reported that she has been taking antibiotic and prednisone taper as directed. Continued to use her nebulizer every 4 hours. Also had been using the Hycodan regularly.  At office visit 11/07/13 she stated she came in that day because her chest is still feeling tight and she was still wheezing. Reported that she had no documented fevers. At that visit:   Acute bronchitis  Give Depo-Medrol 80 mg IM now. SHe will start the prednisone taper are listed below starting tomorrow. Complete the azithromycin that was prescribed by the ER. Continue using albuterol nebulizers every 4 hours. SHe is also requesting a refill on her Hycodan so I have printed her a prescription for that. F/U if  breathing worsens or is not  improving over the next 48 hours.  - predniSONE (DELTASONE) 20 MG tablet; Take 3 daily for 2 days, then 2 daily for 2 days, then 1 daily for 2 days.  Dispense: 12 tablet; Refill: 0 - HYDROcodone-homatropine (HYCODAN) 5-1.5 MG/5ML syrup; Take 5 mLs by mouth every 6 (six) hours as needed for cough.  Dispense: 120 mL; Refill: 0  2. COPD exacerbation - predniSONE (DELTASONE) 20 MG tablet; Take 3 daily for 2 days, then 2 daily for 2 days, then 1 daily for 2 days.  Dispense: 12 tablet; Refill: 0  Today she reports that she never felt much improvement. Even with 1-2 days  after getting the Depo-Medrol 80 mg IM never felt much better. Has continued to feel very short of and have a lot of wheezing.    Home Meds: See attached medication section for any medications that were entered at today's visit. The computer does not put those onto this list.The following list is a list of meds entered prior to today's visit.   Current Outpatient Prescriptions on File Prior to Visit  Medication Sig Dispense Refill  . albuterol (PROVENTIL HFA;VENTOLIN HFA) 108 (90 BASE) MCG/ACT inhaler Inhale 1-2 puffs into the lungs every 4 (four) hours as needed for wheezing.  1 Inhaler  6  . albuterol (PROVENTIL) (2.5 MG/3ML) 0.083% nebulizer solution Take 2.5 mg by nebulization every 6 (six) hours as needed for wheezing or shortness of breath.      . cloNIDine (CATAPRES)  0.1 MG tablet Take 0.1 mg by mouth 2 (two) times daily.      Marland Kitchen FLUoxetine (PROZAC) 40 MG capsule Take 40 mg by mouth daily.      . fluticasone (FLONASE) 50 MCG/ACT nasal spray Place 2 sprays into both nostrils daily.  16 g  1  . HYDROcodone-homatropine (HYCODAN) 5-1.5 MG/5ML syrup Take 5 mLs by mouth every 6 (six) hours as needed for cough.  120 mL  0  . prazosin (MINIPRESS) 1 MG capsule Take 1 mg by mouth at bedtime.      Marland Kitchen tiotropium (SPIRIVA) 18 MCG inhalation capsule Place 1 capsule (18 mcg total) into inhaler and inhale daily.  30 capsule  6  . azithromycin  (ZITHROMAX) 250 MG tablet Take 1 tablet (250 mg total) by mouth daily. Take first 2 tablets together, then 1 every day until finished.  6 tablet  0  . HYDROcodone-acetaminophen (NORCO) 5-325 MG per tablet Take 1 tablet by mouth every 6 (six) hours as needed for moderate pain.  20 tablet  0  . predniSONE (DELTASONE) 10 MG tablet Take 5 tab day 1, take 4 tab day 2, take 3 tab day 3, take 2 tab day 4, and take 1 tab day 5  15 tablet  0  . predniSONE (DELTASONE) 20 MG tablet Take 3 daily for 2 days, then 2 daily for 2 days, then 1 daily for 2 days.  12 tablet  0   No current facility-administered medications on file prior to visit.    Allergies:  Allergies  Allergen Reactions  . Flexeril [Cyclobenzaprine] Other (See Comments)    Doesn't like the way it makes her feel.   . Morphine And Related Other (See Comments)    Alters mental status: Makes violent      Review of Systems: See HPI for pertinent ROS. All other ROS negative.    Physical Exam: Blood pressure 132/96, pulse 88, temperature 98.2 F (36.8 C), temperature source Oral, resp. rate 20, weight 206 lb (93.441 kg), SpO2 96.00%., Body mass index is 31.77 kg/(m^2).  SaO2 on RA is 98% General:  WNWD WF. Appears in no acute distress. HEENT: Normocephalic, atraumatic, eyes without discharge, sclera non-icteric, nares are without discharge. Bilateral auditory canals clear, TM's are without perforation, pearly grey and translucent with reflective cone of light bilaterally. Oral cavity moist, posterior pharynx without exudate, erythema, peritonsillar abscess.  Neck: Supple. No thyromegaly. No lymphadenopathy. Lungs: SHe has harsh wheezes bilaterally throughout. Heart: Regular rhythm. No murmurs, rubs, or gallops. Msk:  Strength and tone normal for age. Extremities/Skin: Warm and dry. Neuro: Alert and oriented X 3. Moves all extremities spontaneously. Gait is normal. CNII-XII grossly in tact. Psych:  Responds to questions appropriately with a  normal affect.     ASSESSMENT AND PLAN:  48 y.o. year old female with    1. Acute bronchitis I recommended that she go to the emergency room. She did consider this but then decided that she really did not want to go to ER. Decided that the other option would be to give her another 80 mg IM of Depo-Medrol now and to obtain a chest x-ray and have her followup here tomorrow. She has albuterol nebulizer at home and can continue using nebulizers every 4 hours. Oxygen saturation is good. However lung exam still with severe harsh wheezes throughout bilaterally. Estimated to the emergency room if she worsens at all. Otherwise will followup here tomorrow.  - DG Chest 2 View; Future - methylPREDNISolone acetate (DEPO-MEDROL)  injection 80 mg; Inject 1 mL (80 mg total) into the muscle once.  2. COPD exacerbation - methylPREDNISolone acetate (DEPO-MEDROL) injection 80 mg; Inject 1 mL (80 mg total) into the muscle once.  3. COPD (chronic obstructive pulmonary disease) - methylPREDNISolone acetate (DEPO-MEDROL) injection 80 mg; Inject 1 mL (80 mg total) into the muscle once.    Murray HodgkinsSigned, Mary Beth [place], [place],  11/18/2013 5:00 PM

## 2013-11-18 NOTE — ED Notes (Signed)
Present with 3 -4 weeks of SOB, wheezing and feeling "like I can not breath" SOB is worse when exertion and lying down. C/o right sided sharp chest pain and pain is worse with deep inspiration. Bilateral wheezes, left side slightly more diminished. Reports some mild edema to feet. SAts 96%, breathing is not labored.

## 2013-11-19 ENCOUNTER — Ambulatory Visit (INDEPENDENT_AMBULATORY_CARE_PROVIDER_SITE_OTHER): Admitting: Neurology

## 2013-11-19 ENCOUNTER — Encounter: Payer: Self-pay | Admitting: Neurology

## 2013-11-19 ENCOUNTER — Encounter (INDEPENDENT_AMBULATORY_CARE_PROVIDER_SITE_OTHER): Payer: Self-pay

## 2013-11-19 VITALS — BP 127/81 | HR 74 | Ht 70.0 in | Wt 202.0 lb

## 2013-11-19 DIAGNOSIS — R32 Unspecified urinary incontinence: Secondary | ICD-10-CM

## 2013-11-19 DIAGNOSIS — F32A Depression, unspecified: Secondary | ICD-10-CM

## 2013-11-19 DIAGNOSIS — J449 Chronic obstructive pulmonary disease, unspecified: Secondary | ICD-10-CM

## 2013-11-19 DIAGNOSIS — F329 Major depressive disorder, single episode, unspecified: Secondary | ICD-10-CM

## 2013-11-19 DIAGNOSIS — F3289 Other specified depressive episodes: Secondary | ICD-10-CM

## 2013-11-19 MED ORDER — MELOXICAM 7.5 MG PO TABS: 7.5000 mg | ORAL_TABLET | Freq: Every day | ORAL | Status: DC

## 2013-11-19 MED ORDER — DIAZEPAM 5 MG PO TABS: 5.0000 mg | ORAL_TABLET | Freq: Four times a day (QID) | ORAL | Status: DC | PRN

## 2013-11-19 NOTE — Progress Notes (Signed)
[facility]  PATIENT: Kara Kelly DOB: 1966/06/04  HISTORICAL  (Initial visit Aug 22, 2013) Kara HatchetSheila is a 48 year old right-handed Caucasian female, referred by her primary care physician Dr. Jeanice Limurham and Neurosurgeon Dr. Mikal Planeabell for evaluation of incontinence, neck pain  She had past medical history of severe depression,  getting worse since the summer of 2014 to the point of suicidal, she was medicated  with polypharmacy, complained of drowsiness, over sedation, around October 2014, she developed bladder and bowel incontinence, it was attributed to worsening depression, and medication side effect.  She [facility], there was significant improvement of her depression, she is currently taking mirtazapine 30 mg every day, Valium 5 mg every day, 4 other antidepression was stopped, she could not recall the name of those medications.  Despite improvement of her depression, she had persistent bowel and bladder incontinence, in addition she complains of neck pain, radiating pain to her left arm, and also right leg numbness, pain, bilateral hand and feet numbness tingling, mild gait difficulty.  UPDATE April 21th 2015: She still has constant pain at the upper back, "like a knot" constant, she has to stretch constant,  She could not walk very far, she suffered COPD, smoker, she has difficulty initiate urine, "I am going to burst, but I could not go", she has bowel urgency, diahrrea, she has hand tremor, anxiety.  She is no longer receiving ECT treatment, depression overall under better control,  The most bothersome symptoms is her upper back pain, we have reviewed MRI of the brain, that was normal, MRI of cervical spine showed previous anterior fusion at the C5-6 and 7,C4-5: rightward disc bulging with slight deformation of right anterior spinal cord; no spinal stenosis or foraminal narrowing    REVIEW OF SYSTEMS: Full  14 system review of systems performed and notable only for fatigue, trouble swallowing, cough, wheezing, shortness of breath, chest tightness, alert intolerance, excessive thirst, diarrhea, frequent wakening, frequent infections joint pain, low back pain, neck pain, neck stiffness, memory loss, dizziness, headaches, numbness, weakness, tremor, agitation, behavior problem, confusion, depression, nervousness and anxiety   ALLERGIES: Allergies  Allergen Reactions  . Flexeril [Cyclobenzaprine] Other (See Comments)    Doesn't like the way it makes her feel.   . Morphine And Related Other (See Comments)    Alters mental status: Makes violent    HOME MEDICATIONS: Outpatient Prescriptions Prior to Visit  Medication Sig Dispense Refill  . albuterol (PROVENTIL HFA;VENTOLIN HFA) 108 (90 BASE) MCG/ACT inhaler Inhale 1-2 puffs into the lungs every 4 (four) hours as needed for wheezing.  1 Inhaler  6  . albuterol (PROVENTIL) (2.5 MG/3ML) 0.083% nebulizer solution Take 2.5 mg by nebulization every 6 (six) hours as needed for wheezing or shortness of breath.      . cloNIDine (CATAPRES) 0.1 MG tablet Take 0.1 mg by mouth 2 (two) times daily.      . prazosin (MINIPRESS) 1 MG capsule Take 1 mg by mouth at bedtime.      Marland Kitchen. tiotropium (SPIRIVA) 18 MCG inhalation capsule Place 1 capsule (18 mcg total) into inhaler and inhale daily.  30 capsule  6  . azithromycin (ZITHROMAX) 250 MG tablet Take 1 tablet (250 mg total) by mouth daily. Take first 2 tablets together, then 1 every day until finished.  6 tablet  0  . FLUoxetine (PROZAC) 40 MG capsule Take 40 mg by mouth daily.      . fluticasone (FLONASE) 50  MCG/ACT nasal spray Place 2 sprays into both nostrils daily.  16 g  1  . HYDROcodone-acetaminophen (NORCO) 5-325 MG per tablet Take 1 tablet by mouth every 6 (six) hours as needed for moderate pain.  20 tablet  0  . HYDROcodone-homatropine (HYCODAN) 5-1.5 MG/5ML syrup Take 5 mLs by mouth every 6 (six) hours as needed  for cough.  120 mL  0  . levofloxacin (LEVAQUIN) 750 MG tablet Take 1 tablet (750 mg total) by mouth daily.  10 tablet  0  . predniSONE (DELTASONE) 10 MG tablet Take 5 tab day 1, take 4 tab day 2, take 3 tab day 3, take 2 tab day 4, and take 1 tab day 5  15 tablet  0  . predniSONE (DELTASONE) 20 MG tablet Take 3 daily for 2 days, then 2 daily for 2 days, then 1 daily for 2 days.  12 tablet  0  . predniSONE (DELTASONE) 20 MG tablet Take 3 daily for 4 days then 2 daily for 3 days then 1 daily for 2 days  20 tablet  0   No facility-administered medications prior to visit.    PAST MEDICAL HISTORY: Past Medical History  Diagnosis Date  . Panic attack   . Depression   . Endometriosis   . Bipolar 1 disorder   . Headache(784.0)   . Personality disorder   . Tuberculosis     been tested positive for it  . COPD (chronic obstructive pulmonary disease)   . Pneumonia complicating pregnancy   . Pneumonia     chronic per lung doctor    PAST SURGICAL HISTORY: Past Surgical History  Procedure Laterality Date  . Appendectomy    . Cholecystectomy    . Abdominal hysterectomy    . Neck surgery   2007  . Cholecystectomy      FAMILY HISTORY: History reviewed. No pertinent family history.  SOCIAL HISTORY:  History   Social History  . Marital Status: Married    Spouse Name: Onalee HuaDavid    Number of Children: 1  . Years of Education: GED   Occupational History    disabled   Social History Main Topics  . Smoking status: Current Every Day Smoker -- 1.00 packs/day    Types: Cigarettes  . Smokeless tobacco: Never Used  . Alcohol Use: No  . Drug Use: No     Comment: last use x 4 weeks ago  . Sexual Activity: Not on file   Other Topics Concern  . Not on file   Social History Narrative   Patient lives at home with her husband Onalee Hua(David)   Disabled   Education GED   Right handed.   Caffeine- one glass of sweet daily.     PHYSICAL EXAM   Filed Vitals:   11/19/13 0814  BP: 127/81   Pulse: 74  Height: 5\' 10"  (1.778 m)  Weight: 202 lb (91.627 kg)    Not recorded    Body mass index is 28.98 kg/(m^2).   Generalized: In no acute distress  Neck: Supple, no carotid bruits   Cardiac: Regular rate rhythm  Pulmonary: Clear to auscultation bilaterally  Musculoskeletal: No deformity  Neurological examination  Mentation: Alert oriented to time, place, history taking, and causual conversation  Cranial nerve II-XII: Pupils were equal round reactive to light extraocular movements were full, Visual field were full on confrontational test. Bilateral fundi were sharp.  Facial sensation and strength were normal. Hearing was intact to finger rubbing bilaterally. Uvula tongue midline.  head turning and shoulder shrug and were normal and symmetric.Tongue protrusion into cheek strength was normal.  Motor: normal tone, bulk and strength.  Sensory: Intact to fine touch, pinprick, preserved vibratory sensation, and proprioception at toes.  Coordination: Normal finger to nose, heel-to-shin bilaterally there was no truncal ataxia  Gait: Rising up from seated position without assistance, mildly cautious stiff gait, deliberate postural, but was able to keep her balance very well, has difficulty perform tiptoe, heels, tandem walking  Deep tendon reflexes: Brachioradialis 2/2, biceps 2/2, triceps 2/2, patellar 2/2, Achilles 2/2, plantar responses were flexor bilaterally.   DIAGNOSTIC DATA (LABS, IMAGING, TESTING) - I reviewed patient records, labs, notes, testing and imaging myself where available.  Lab Results  Component Value Date   WBC 10.0 11/18/2013   HGB 13.6 11/18/2013   HCT 41.1 11/18/2013   MCV 97.4 11/18/2013   PLT 300 11/18/2013      Component Value Date/Time   NA 144 11/18/2013 1752   K 4.4 11/18/2013 1752   CL 107 11/18/2013 1752   CO2 25 11/18/2013 1752   GLUCOSE 86 11/18/2013 1752   BUN 11 11/18/2013 1752   CREATININE 0.73 11/18/2013 1752   CREATININE 0.87 10/08/2013  1506   CALCIUM 9.5 11/18/2013 1752   PROT 6.2 11/04/2013 2136   ALBUMIN 3.3* 11/04/2013 2136   AST 21 11/04/2013 2136   ALT 17 11/04/2013 2136   ALKPHOS 68 11/04/2013 2136   BILITOT 0.4 11/04/2013 2136   GFRNONAA >90 11/18/2013 1752   GFRAA >90 11/18/2013 1752    ASSESSMENT AND PLAN   48 year old Caucasian female, with past medical history of bipolar disorder, now presenting with 3 months history of urinary and  occasionally bowel  incontinence , complains of constant upper back pain since October 2014, essentially normal MRI of the brain, post surgical change of MRI cervical spine, no evidence of canal stenosis,  1, most likely musculoskeletal in etiology 2. Complete evaluation with MRI of thoracic spine 3. She is asking for hydrocodone for her pain control, I will refer her to pain management 4. Physical therapy   I will call her MRI of thoracic spine report, she will only return to clinic if there any neurological condition found   Levert Feinstein, M.D. Ph.D.  [facility] [address], [address] [place], [place] [address] [phone]

## 2013-11-26 ENCOUNTER — Telehealth: Payer: Self-pay | Admitting: Neurology

## 2013-11-26 ENCOUNTER — Telehealth: Payer: Self-pay | Admitting: *Deleted

## 2013-11-26 NOTE — Telephone Encounter (Signed)
Pt calling stating that the pain management clinic that she was referred to does not accept her insurance. I called Dr. Herminio HeadsShawn Dalton-Bethea office and they stated that they did not accept [employer] as a primary insurance. Pt is wondering if there is anything Dr. Terrace ArabiaYan could prescribe for her pain in the meantime. Please advise

## 2013-11-26 NOTE — Telephone Encounter (Signed)
Received call from patient.   Reports that pain management clinic will not accept insurance and she can not be seen.   Requested MD to prescribe pain medication for back pain.   Call back number [phone]  MD please advise.

## 2013-11-26 NOTE — Telephone Encounter (Signed)
MD recommended F/U with Neurology since referral for pain management was placed by patient neurologist.   Call placed to patient and patient made aware.

## 2013-11-26 NOTE — Telephone Encounter (Signed)
Pt called states the pain clinic that she went to states they won't take her insurance. Pt is going to have to look for another one in the mean time pt wants to know if there is anything that Dr. Terrace ArabiaYan can call in for her pain. Please call pt back concerning this matter. Thanks

## 2013-11-27 ENCOUNTER — Encounter: Payer: Self-pay | Admitting: *Deleted

## 2013-11-28 ENCOUNTER — Emergency Department (HOSPITAL_COMMUNITY)
Admission: EM | Admit: 2013-11-28 | Discharge: 2013-11-28 | Disposition: A | Discharge status: Home / Self Care | Attending: Emergency Medicine | Admitting: Emergency Medicine

## 2013-11-28 ENCOUNTER — Encounter (HOSPITAL_COMMUNITY): Payer: Self-pay | Admitting: Emergency Medicine

## 2013-11-28 DIAGNOSIS — Z8619 Personal history of other infectious and parasitic diseases: Secondary | ICD-10-CM | POA: Insufficient documentation

## 2013-11-28 DIAGNOSIS — Z79899 Other long term (current) drug therapy: Secondary | ICD-10-CM | POA: Insufficient documentation

## 2013-11-28 DIAGNOSIS — Z8742 Personal history of other diseases of the female genital tract: Secondary | ICD-10-CM | POA: Insufficient documentation

## 2013-11-28 DIAGNOSIS — Z8701 Personal history of pneumonia (recurrent): Secondary | ICD-10-CM | POA: Insufficient documentation

## 2013-11-28 DIAGNOSIS — F17200 Nicotine dependence, unspecified, uncomplicated: Secondary | ICD-10-CM | POA: Insufficient documentation

## 2013-11-28 DIAGNOSIS — G8929 Other chronic pain: Secondary | ICD-10-CM | POA: Insufficient documentation

## 2013-11-28 DIAGNOSIS — Z8611 Personal history of tuberculosis: Secondary | ICD-10-CM | POA: Insufficient documentation

## 2013-11-28 DIAGNOSIS — J449 Chronic obstructive pulmonary disease, unspecified: Secondary | ICD-10-CM | POA: Insufficient documentation

## 2013-11-28 DIAGNOSIS — F319 Bipolar disorder, unspecified: Secondary | ICD-10-CM | POA: Insufficient documentation

## 2013-11-28 DIAGNOSIS — F410 Panic disorder [episodic paroxysmal anxiety] without agoraphobia: Secondary | ICD-10-CM | POA: Insufficient documentation

## 2013-11-28 DIAGNOSIS — R51 Headache: Secondary | ICD-10-CM | POA: Insufficient documentation

## 2013-11-28 DIAGNOSIS — J4489 Other specified chronic obstructive pulmonary disease: Secondary | ICD-10-CM | POA: Insufficient documentation

## 2013-11-28 DIAGNOSIS — J329 Chronic sinusitis, unspecified: Secondary | ICD-10-CM | POA: Insufficient documentation

## 2013-11-28 HISTORY — DX: Cervicalgia: M54.9

## 2013-11-28 HISTORY — DX: Other chronic pain: G89.29

## 2013-11-28 HISTORY — DX: Cervicalgia: M54.2

## 2013-11-28 MED ORDER — PROMETHAZINE HCL 25 MG/ML IJ SOLN
25.0000 mg | Freq: Once | INTRAMUSCULAR | Status: AC
  Administered 2013-11-28: 25 mg via INTRAVENOUS

## 2013-11-28 MED ORDER — METHOCARBAMOL 500 MG PO TABS: 1000.0000 mg | ORAL_TABLET | Freq: Four times a day (QID) | ORAL | Status: DC | PRN

## 2013-11-28 MED ORDER — DIPHENHYDRAMINE HCL 50 MG/ML IJ SOLN: 50.0000 mg | Freq: Once | INTRAMUSCULAR | Status: DC

## 2013-11-28 MED ORDER — PROMETHAZINE HCL 25 MG PO TABS: 25.0000 mg | ORAL_TABLET | Freq: Four times a day (QID) | ORAL | Status: DC | PRN

## 2013-11-28 MED ORDER — PROMETHAZINE HCL 25 MG/ML IJ SOLN
INTRAMUSCULAR | Status: AC
  Administered 2013-11-28: 25 mg via INTRAVENOUS
  Filled 2013-11-28: qty 1

## 2013-11-28 MED ORDER — HYDROMORPHONE HCL PF 1 MG/ML IJ SOLN
1.0000 mg | Freq: Once | INTRAMUSCULAR | Status: AC
  Administered 2013-11-28: 1 mg via INTRAVENOUS
  Filled 2013-11-28: qty 1

## 2013-11-28 MED ORDER — PROMETHAZINE HCL 25 MG/ML IJ SOLN: 25.0000 mg | Freq: Once | INTRAMUSCULAR | Status: DC

## 2013-11-28 MED ORDER — KETOROLAC TROMETHAMINE 60 MG/2ML IM SOLN: 60.0000 mg | Freq: Once | INTRAMUSCULAR | Status: DC

## 2013-11-28 MED ORDER — KETOROLAC TROMETHAMINE 30 MG/ML IJ SOLN
30.0000 mg | Freq: Once | INTRAMUSCULAR | Status: AC
  Administered 2013-11-28: 30 mg via INTRAVENOUS
  Filled 2013-11-28: qty 1

## 2013-11-28 MED ORDER — SODIUM CHLORIDE 0.9 % IV BOLUS (SEPSIS)
1000.0000 mL | Freq: Once | INTRAVENOUS | Status: AC
  Administered 2013-11-28: 1000 mL via INTRAVENOUS

## 2013-11-28 MED ORDER — DIPHENHYDRAMINE HCL 50 MG/ML IJ SOLN
50.0000 mg | Freq: Once | INTRAMUSCULAR | Status: AC
  Administered 2013-11-28: 50 mg via INTRAVENOUS
  Filled 2013-11-28: qty 1

## 2013-11-28 MED ORDER — NAPROXEN 250 MG PO TABS: 250.0000 mg | ORAL_TABLET | Freq: Two times a day (BID) | ORAL | Status: DC

## 2013-11-28 NOTE — Discharge Instructions (Signed)
°Emergency Department Resource Guide °1) Find a Doctor and Pay Out of Pocket °Although you won't have to find out who is covered by your insurance plan, it is a good idea to ask around and get recommendations. You will then need to call the office and see if the doctor you have chosen will accept you as a new patient and what types of options they offer for patients who are self-pay. Some doctors offer discounts or will set up payment plans for their patients who do not have insurance, but you will need to ask so you aren't surprised when you get to your appointment. ° °2) Contact Your Local Health Department °Not all health departments have doctors that can see patients for sick visits, but many do, so it is worth a call to see if yours does. If you don't know where your local health department is, you can check in your phone book. The CDC also has a tool to help you locate your state's health department, and many state websites also have listings of all of their local health departments. ° °3) Find a Walk-in Clinic °If your illness is not likely to be very severe or complicated, you may want to try a walk in clinic. These are popping up all over the country in pharmacies, drugstores, and shopping centers. They're usually staffed by nurse practitioners or physician assistants that have been trained to treat common illnesses and complaints. They're usually fairly quick and inexpensive. However, if you have serious medical issues or chronic medical problems, these are probably not your best option. ° °No Primary Care Doctor: °- Call [facility] at  832-8000 - they can help you locate a primary care doctor that  accepts your insurance, provides certain services, etc. °- Physician Referral Service- 1-800-533-3463 ° °Chronic Pain Problems: °Organization         Address  Phone   Notes  °[facility]  (336) 297-2271 Patients need to be referred by their primary care doctor.  ° °Medication  Assistance: °Organization         Address  Phone   Notes  °[place] [facility] [address] °[place], [place] [address] (336) 641-8030 --Must be a resident of [place] °-- Must have NO insurance coverage whatsoever (no Medicaid/ Medicare, etc.) °-- The pt. MUST have a primary care doctor that directs their care regularly and follows them in the community °  °[facility]  (866) 331-1348   °[employer]  (888) 892-1162   ° °Agencies that provide inexpensive medical care: °Organization         Address  Phone   Notes  °[facility]  (336) 832-8035   °[facility]    (336) 832-7272   °[facility] [address] °[place], [place] [address] (336) 832-4777   °[facility] [address], °[place] (336) 271-4999   °[facility]    (336) 373-0678   °[facility]    (336) 272-1050   °[facility] ° [address], [place] Phone:  (336) 832-4444, Fax:  (336) 832-4440 Hours of Operation:  9 am - 6 pm, M-F.  Also accepts Medicaid/Medicare and self-pay.  °[facility] ° [address], [place] Phone: (336) 832-3150, Fax: (336) [phone]. Hours of Operation:  8:30 am - 5:30 pm, M-F.  Also accepts Medicaid and self-pay.  °[facility] [place] [address], [place] Phone: (336) 878-6027   °[facility] [address], [place], [place] (336)723-1848, Ext. 123 Mondays & Thursdays: 7-9 AM.  First 15 patients are seen on a first come, first serve basis. °  ° °Medicaid-accepting [place] Providers: ° °Organization         Address  Phone   Notes  °[facility] [address], [place] (336) 641-2100 Also accepts self-pay patients.  °[facility] [address], [place] ° (336) 856-9996   °[facility] [address], [place]  (336) 288-8857   °[facility] [address] [place] [address], [place] (336) 299-7000   °Veita Bland [address], [place]  ° (336) 373-1557 Only accepts [place] Access Medicaid patients after they have their name applied to their card.  ° °Self-Pay (no insurance) in [place]: ° °Organization         Address  Phone   Notes  °Sickle Cell Patients, [facility] [address], [place] (336) 832-1970   °[facility] [address], [place] (336) 832-4400   °[facility] ° [address], [place] (336) 992-4800   °[facility]/Dr. Osei-Bonsu ° [address] [place] [address], [place] or [address], [place] (336) 841-8500 Phone number for both [place] and [place] locations is the same.  °[facility] [address], [place] (336) 299-0000   °[facility] [address] [place] [address], [place] or [address] (336) 852-7530 °(336) 878-2260   °[facility] [address], [place] (336) 350-1642, phone; (336) 294-5005, fax Sees patients 1st and 3rd Saturday of every month.  Must not qualify for public or private insurance (i.e. Medicaid, Medicare, [place] Health Choice, Veterans' Benefits) • Household income should be no more than 200% of the poverty level •The clinic cannot treat you if you are pregnant or think you are pregnant • Sexually transmitted diseases are not treated at the clinic.  ° ° °Dental Care: °Organization         Address  Phone  Notes  °[place] [facility] [address], [place] (336) 641-6152 Accepts children up to age 21 who are enrolled in Medicaid or [place] Health Choice; pregnant women with a Medicaid card; and children who have applied for Medicaid or [place] Health Choice, but were declined, whose parents can pay a reduced fee at time of service.  °[place]  [facility] [place]  [address], [place] (336) 641-7733 Accepts children up to age 21 who are enrolled in Medicaid or [place] Health Choice; pregnant women with a Medicaid card; and children who have applied for Medicaid or [place] Health Choice, but were declined, whose parents can pay a reduced fee at time of service.  °[facility] ° [address], [place] (336) 641-4533 Patients are seen by appointment only. Walk-ins are not accepted. [facility] will see patients 18 years of age and older. °Monday - Tuesday (8am-5pm) °Most Wednesdays (8:30-5pm) °$30 per visit, cash only  °[facility] ° [address], [place] (336) 641-4533 Patients are seen by appointment only. Walk-ins are not accepted. [facility] will see patients 18 years of age and older. °One   Wednesday Evening (Monthly: Volunteer Based).  $30 per visit, cash only  °[facility]  (919) 537-3737 for adults; Children under age 4, call Graduate Pediatric Dentistry at ([phone]) [phone]. Children aged 4-14, please call ([phone]) [phone] to request a pediatric application. ° Dental services are provided in all areas of dental care including fillings, crowns and bridges, complete and partial dentures, implants, gum treatment, root canals, and extractions. Preventive care is also provided. Treatment is provided to both adults and children. °Patients are selected via a lottery and there is often a waiting list. °  °[facility] [address], °[place] ° (336) 763-8833 [email or web address] °  °[facility] [address], [place], [place] (336)723-1848, Ext. 123 Second and Fourth Thursday of each month, opens at 6:30 AM; Clinic ends at 9 AM.  Patients are seen on a first-come first-served basis, and a limited number are seen during each clinic.  ° °[facility] ° [address], [place], [place] (336) 723-7904    Eligibility Requirements °You must have lived in [place], [place], or [place] counties for at least the last three months. °  You cannot be eligible for state or federal sponsored healthcare insurance, including Veterans Administration, Medicaid, or Medicare. °  You generally cannot be eligible for healthcare insurance through your employer.  °  How to apply: °Eligibility screenings are held every Tuesday and Wednesday afternoon from 1:00 pm until 4:00 pm. You do not need an appointment for the interview!  °[facility] [address], [place], [place] 336-631-2330   °[facility]  336-342-8273   °[facility]  336-703-3100   °[facility]  336-570-6415   ° °Behavioral Health Resources in the Community: °Intensive Outpatient Programs °Organization         Address  Phone  Notes  °[facility] [address], [place], [place] 336-878-6098   °[facility] [address], [place], [place] 336-832-9800   °[facility] [address], [place], [place] ° 336-882-2125   °[facility] [address],  °[place], [place] 1-800-853-5163 or 336-641-4981   °Substance Abuse Resources °Organization         Address  Phone  Notes  °[facility]  336-882-2125   °[facility]  336-784-9470   °[facility]  336-285-9073   °[facility]  336-845-3988   °Residential & Outpatient Substance Abuse Program  1-800-659-3381   °Psychological Services °Organization         Address  Phone  Notes  °[facility]  336- 832-9600   °[facility]  336- 378-7881   °[facility] [address], [place] 1-800-853-5163 or 336-641-4981   ° °Mobile Crisis Teams °Organization         Address  Phone  Notes  °[facility], Mobile Crisis Care Unit  1-877-626-1772   °Assertive °[facility] ° [address]  [place], [place] 336-834-9664   °Sharon DeEsch [address] °[place] 336-554-5454   ° °Self-Help/Support Groups °Organization         Address  Phone             Notes  °[facility] of  - variety of support groups  336- 373-1402 Call for more information  °Narcotics Anonymous (NA), [facility] [address], °[place]  2 meetings at this location  ° °  Residential Treatment Programs Organization         Address  Phone  Notes  [facility] [address],    [place]  [phone]   [facility]  [address], [place] [phone], [place], [place] [phone]) [phone]   [facility] [address], [place] [phone] Admissions: 8am-3pm M-F  [facility] [address]    [place], [place] [phone]   [facility] [address], [place], [place] [phone]   [facility] [address]  [place], [place] [phone]   [facility] Intensive Outpatient [address], [place], [place] [phone]   [facility] ([facility] [address]  [place], [place] [phone] or [phone]   [facility] [address] [place], [place] [phone] Accepts Medicaid  [facility] [address]  [place] [phone] Substance Abuse/Addiction Treatment   [facility]         Address  Phone  Notes  [facility]  [phone](888) [phone]   Angie FavaJulie Brannon, PhD [address], [address] [place], [place]   [phone](336) [phone] or [phone](336) ([phone]   [facility]   [address] [place], [place] [phone](336) [phone]   [facility] [address], [place], [place] [phone])[phone](336) [phone] Insurance/Medicaid/sponsorship through [facility] [address]                                    [place], [place] [phone]) [phone] Therapy/tele-psych/case    [facility] [address]   [place], [place] [phone]) [phone]    Dr. Lolly MustacheArfeen  [phone]) [phone]   [facility]  [employer] [facility] 1) [address], [place] 2) [address], [place] 3)  [address], [place] [phone]) [phone]) [phone]  [phone](336) [phone]   [facility] Child Abuse Hot[facility] 437-722-1636(336) (743)324-2100 or (530)[phone](336) [phone] (After Hours)       Take over the counter tylenol and naprosyn prescription (OR over the counter Excedrin) and benadryl, as directed on packaging, with the prescription given to you today, as needed for headache.  Keep a headache diary to show your doctor at your next office visit. Take over the counter decongestant (such as sudafed), as well as an antihistamine (such as Claritin or zyrtec), as directed on packaging, for the next week.  Use over the counter normal saline nasal spray, as instructed in the Emergency Department, several times per day for the next 2 weeks.  Call your regular medical doctor tomorrow to schedule a follow up appointment within the next 3  days.  Return to the Emergency Department immediately sooner if worsening.

## 2013-11-28 NOTE — ED Provider Notes (Signed)
CSN: [id number]     Arrival date & time 11/28/13  1642 History   First MD Initiated Contact with Patient 11/28/13 1722     Chief Complaint  Patient presents with  . Migraine      HPI Pt was seen at 1730. Per pt, c/o gradual onset and persistence of constant acute flair of her chronic migraine headache since yesterday. Has been associated with N/V.  Describes the headache as per her usual chronic migraine headache pain pattern for many years. Pt also c/o sinus congestion for the past several days.  Denies headache was sudden or maximal in onset or at any time.  Denies visual changes, no focal motor weakness, no tingling/numbness in extremities, no sore throat, no fevers, no neck pain, no rash.      Past Medical History  Diagnosis Date  . Panic attack   . Depression   . Endometriosis   . Bipolar 1 disorder   . Headache(784.0)     daily  . Personality disorder   . Tuberculosis     been tested positive for it  . COPD (chronic obstructive pulmonary disease)   . Pneumonia complicating pregnancy   . Pneumonia     chronic per lung doctor  . Chronic neck and back pain    Past Surgical History  Procedure Laterality Date  . Appendectomy    . Cholecystectomy    . Abdominal hysterectomy    . Neck surgery    . Cholecystectomy     Family History  Problem Relation Age of Onset  . Kidney cancer Father   . Heart disease Mother    History  Substance Use Topics  . Smoking status: Current Every Day Smoker -- 1.00 packs/day    Types: Cigarettes  . Smokeless tobacco: Never Used  . Alcohol Use: No    Review of Systems ROS: Statement: All systems negative except as marked or noted in the HPI; Constitutional: Negative for fever and chills. ; ; Eyes: Negative for eye pain, redness and discharge. ; ; ENMT: Negative for ear pain, hoarseness, nasal congestion, sinus pressure and sore throat. ; ; Cardiovascular: Negative for chest pain, palpitations, diaphoresis, dyspnea and peripheral edema. ;  ; Respiratory: Negative for cough, wheezing and stridor. ; ; Gastrointestinal: +N/V. Negative for diarrhea, abdominal pain, blood in stool, hematemesis, jaundice and rectal bleeding. . ; ; Genitourinary: Negative for dysuria, flank pain and hematuria. ; ; Musculoskeletal: Negative for back pain and neck pain. Negative for swelling and trauma.; ; Skin: Negative for pruritus, rash, abrasions, blisters, bruising and skin lesion.; ; Neuro: +headache. Negative for lightheadedness and neck stiffness. Negative for weakness, altered level of consciousness , altered mental status, extremity weakness, paresthesias, involuntary movement, seizure and syncope.        Allergies  Flexeril and Morphine and related  Home Medications   Prior to Admission medications   Medication Sig Start Date End Date Taking? Authorizing Provider  albuterol (PROVENTIL HFA;VENTOLIN HFA) 108 (90 BASE) MCG/ACT inhaler Inhale 2 puffs into the lungs every 6 (six) hours as needed for wheezing or shortness of breath.   Yes Historical Provider, MD  albuterol (PROVENTIL) (2.5 MG/3ML) 0.083% nebulizer solution Take 2.5 mg by nebulization every 6 (six) hours as needed for wheezing or shortness of breath.   Yes Historical Provider, MD  Ascorbic Acid (VITAMIN C PO) Take 1 tablet by mouth daily.   Yes Historical Provider, MD  asenapine (SAPHRIS) 5 MG SUBL 24 hr tablet Place 5 mg under the tongue  at bedtime.   Yes Historical Provider, MD  CINNAMON PO Take 1 capsule by mouth daily.   Yes Historical Provider, MD  cloNIDine (CATAPRES) 0.1 MG tablet Take 0.1 mg by mouth 2 (two) times daily.   Yes Historical Provider, MD  Cyanocobalamin (VITAMIN B 12 PO) Take 1 tablet by mouth daily.   Yes Historical Provider, MD  ibuprofen (ADVIL,MOTRIN) 200 MG tablet Take 800 mg by mouth every 6 (six) hours as needed for moderate pain.   Yes Historical Provider, MD  LORazepam (ATIVAN) 1 MG tablet Take 1 mg by mouth 4 (four) times daily.    Yes Historical  Provider, MD  prazosin (MINIPRESS) 2 MG capsule Take 2 mg by mouth at bedtime.   Yes Historical Provider, MD  Pyridoxine HCl (VITAMIN B-6 PO) Take 1 tablet by mouth daily.   Yes Historical Provider, MD  sertraline (ZOLOFT) 100 MG tablet Take 100 mg by mouth daily.   Yes Historical Provider, MD   BP 115/62  Pulse 91  Temp(Src) 98.2 F (36.8 C) (Oral)  Resp 16  SpO2 97% Physical Exam 1735; Physical examination:  Nursing notes reviewed; Vital signs and O2 SAT reviewed;  Constitutional: Well developed, Well nourished, Well hydrated, Uncomfortable appearing.; Head:  Normocephalic, atraumatic; Eyes: EOMI, PERRL, No scleral icterus; ENMT: TM's clear bilat. +edemetous nasal turbinates bilat with clear rhinorrhea. Mouth and pharynx normal, Mucous membranes moist; Neck: Supple, Full range of motion, No lymphadenopathy; Cardiovascular: Regular rate and rhythm, No murmur, rub, or gallop; Respiratory: Breath sounds clear & equal bilaterally, No rales, rhonchi, wheezes.  Speaking full sentences with ease, Normal respiratory effort/excursion; Chest: Nontender, Movement normal; Abdomen: Soft, Nontender, Nondistended, Normal bowel sounds; Genitourinary: No CVA tenderness; Extremities: Pulses normal, No tenderness, No edema, No calf edema or asymmetry.; Neuro: AA&Ox3, Major CN grossly intact. No facial droop. Speech clear. No gross focal motor or sensory deficits in extremities.; Skin: Color normal, Warm, Dry.   ED Course  Procedures     EKG Interpretation None      MDM  MDM Reviewed: previous chart, nursing note and vitals    2000:  Pt watching TV on my re-eval. No N/V. Requesting narcotic pain medication rx. [place] Controlled Substance Database accessed: pt has received 5 different narcotic rx written by 5 different medical providers which were filled at 2 different pharmacies over the past 1 month. D/W pt regarding same and that I would treat her pain, but not with a rx for narcotic medications.  Pt verb  understanding and requesting to go home now. Dx and testing d/w pt.  Questions answered.  Verb understanding, agreeable to d/c home with outpt f/u.      Laray AngerKathleen M Rachele Lamaster, DO 12/01/13 1545

## 2013-11-28 NOTE — ED Notes (Signed)
Migraine since yesterday with vomiting as well. Light sensitive as well.

## 2013-11-28 NOTE — ED Notes (Signed)
Pt still c/o headache, MD at bedside

## 2013-11-29 ENCOUNTER — Encounter: Payer: Self-pay | Admitting: Obstetrics & Gynecology

## 2013-11-29 ENCOUNTER — Ambulatory Visit (INDEPENDENT_AMBULATORY_CARE_PROVIDER_SITE_OTHER): Admitting: Obstetrics & Gynecology

## 2013-11-29 VITALS — BP 128/84 | HR 62 | Temp 97.8°F | Ht 68.0 in | Wt 212.7 lb

## 2013-11-29 DIAGNOSIS — R102 Pelvic and perineal pain: Secondary | ICD-10-CM

## 2013-11-29 DIAGNOSIS — N949 Unspecified condition associated with female genital organs and menstrual cycle: Secondary | ICD-10-CM

## 2013-11-29 NOTE — Telephone Encounter (Signed)
Please let patient know, I will write her pain meds, she still needs to find a pain management or continues with her primary care.

## 2013-11-29 NOTE — Progress Notes (Signed)
Patient ID: Kara Kelly, female   DOB: 01/02/1966, 48 y.o.   MRN: [id number]  Chief Complaint  Patient presents with  . Follow-up    questionable ovarian cyst    HPI Kara GlassmanShelia Kelly Kara GoslingLinnabery is a 48 y.o. female.  No obstetric history on file. G0, hysterectomy done age 89 for endometriosis, has LLQ pain, CT showed 2.7 cm hemorrhagic  R ovarian cyst   HPI  Past Medical History  Diagnosis Date  . Panic attack   . Depression   . Endometriosis   . Bipolar 1 disorder   . Headache(784.0)     daily  . Personality disorder   . Tuberculosis     been tested positive for it  . COPD (chronic obstructive pulmonary disease)   . Pneumonia complicating pregnancy   . Pneumonia     chronic per lung doctor  . Chronic neck and back pain     Past Surgical History  Procedure Laterality Date  . Appendectomy    . Cholecystectomy    . Abdominal hysterectomy    . Neck surgery    . Cholecystectomy      Family History  Problem Relation Age of Onset  . Kidney cancer Father   . Heart disease Mother     Social History History  Substance Use Topics  . Smoking status: Current Every Day Smoker -- 1.00 packs/day    Types: Cigarettes  . Smokeless tobacco: Never Used  . Alcohol Use: No    Allergies  Allergen Reactions  . Flexeril [Cyclobenzaprine] Other (See Comments)    Doesn't like the way it makes her feel.   . Morphine And Related Other (See Comments)    Alters mental status: Makes violent    Current Outpatient Prescriptions  Medication Sig Dispense Refill  . albuterol (PROVENTIL HFA;VENTOLIN HFA) 108 (90 BASE) MCG/ACT inhaler Inhale 2 puffs into the lungs every 6 (six) hours as needed for wheezing or shortness of breath.      Marland Kitchen. albuterol (PROVENTIL) (2.5 MG/3ML) 0.083% nebulizer solution Take 2.5 mg by nebulization every 6 (six) hours as needed for wheezing or shortness of breath.      . Ascorbic Acid (VITAMIN C PO) Take 1 tablet by mouth daily.      Marland Kitchen. asenapine (SAPHRIS)  5 MG SUBL 24 hr tablet Place 5 mg under the tongue at bedtime.      Marland Kitchen. CINNAMON PO Take 1 capsule by mouth daily.      . cloNIDine (CATAPRES) 0.1 MG tablet Take 0.1 mg by mouth 2 (two) times daily.      . Cyanocobalamin (VITAMIN B 12 PO) Take 1 tablet by mouth daily.      Marland Kitchen. LORazepam (ATIVAN) 1 MG tablet Take 1 mg by mouth 4 (four) times daily.       . prazosin (MINIPRESS) 2 MG capsule Take 2 mg by mouth at bedtime.      . Pyridoxine HCl (VITAMIN B-6 PO) Take 1 tablet by mouth daily.      . sertraline (ZOLOFT) 100 MG tablet Take 100 mg by mouth daily.      Marland Kitchen. ibuprofen (ADVIL,MOTRIN) 200 MG tablet Take 800 mg by mouth every 6 (six) hours as needed for moderate pain.      . methocarbamol (ROBAXIN) 500 MG tablet Take 2 tablets (1,000 mg total) by mouth 4 (four) times daily as needed for muscle spasms (muscle spasm/pain).  25 tablet  0  . naproxen (NAPROSYN) 250 MG tablet Take 1 tablet (250  mg total) by mouth 2 (two) times daily with a meal.  14 tablet  0  . promethazine (PHENERGAN) 25 MG tablet Take 1 tablet (25 mg total) by mouth every 6 (six) hours as needed for nausea or vomiting.  8 tablet  0   No current facility-administered medications for this visit.    Review of Systems Review of Systems  Constitutional: Negative.   Gastrointestinal: Positive for abdominal pain, diarrhea, constipation and abdominal distention.  Genitourinary: Negative for frequency, vaginal bleeding, vaginal discharge and menstrual problem.    Blood pressure 128/84, pulse 62, temperature 97.8 F (36.6 C), temperature source Oral, height 5\' 8"  (1.727 m), weight 212 lb 11.2 oz (96.48 kg).  Physical Exam Physical Exam  Constitutional: She is oriented to person, place, and time. She appears well-developed. No distress.  Pulmonary/Chest: No respiratory distress.  Abdominal: Soft. There is no tenderness.  Genitourinary: No vaginal discharge found.  No mass or tenderness  Neurological: She is alert and oriented to  person, place, and time.  Skin: Skin is warm and dry.  Psychiatric: She has a normal mood and affect. Her behavior is normal.    Data Reviewed  CLINICAL DATA: Chest and flank pain left-sided.  EXAM:  CT ABDOMEN AND PELVIS WITHOUT CONTRAST  TECHNIQUE:  Multidetector CT imaging of the abdomen and pelvis was performed  following the standard protocol without intravenous contrast.  COMPARISON: 11/07/2012 and 02/15/2006  FINDINGS:  Lung bases demonstrate mild opacification over the posterior lower  lobes likely atelectasis, although cannot exclude infection. Tiny  amount of left pleural fluid present.  Abdominal images demonstrate evidence of prior cholecystectomy. The  spleen, liver, pancreas and right adrenal gland are within normal.  There is minimal prominence of the left adrenal gland with  Hounsfield unit measurements less than 10 and unchanged. Kidneys are  normal in size. There is a 1-2 mm calcification over the mid pole of  the right intrarenal collecting system likely nonobstructing stone.  No left renal stones identified. There is an exophytic 1.5 cm  hypodensity of the upper pole of the left kidney with Hounsfield  unit measurements 25. Ureters are within normal. Patient had a prior  appendectomy. There is no free fluid or inflammatory change. There  is minimal calcified plaque over the abdominal aorta and iliac  vessels.  Pelvic images demonstrate surgical absence of the uterus. There are  multiple phleboliths. Bladder is within normal. There is a small  amount of free fluid. Possible 2.8 cm right ovarian mass partially  cystic with hyperdense component. There are degenerative changes of  the spine. There is a mild T12 compression fracture unchanged.  IMPRESSION:  No acute findings in the abdomen/pelvis.  2.8 cm right ovarian mass which is partially cystic with hyperdense  component as this could represent a hemorrhagic cyst. Small amount  of free pelvic fluid.  Recommend pelvic ultrasound for further  evaluation.  Nonobstructing 1-2 mm right renal stone. 1.5 cm minimally hyperdense  exophytic mass over the upper pole of the left kidney likely a  hyperdense cyst. Recommend followup CT 6 months.  Stable prominence of the left adrenal gland since 2007 likely a  small adenoma.  Stable T12 compression fracture.  Minimal bibasilar opacification likely atelectasis although cannot  exclude infection. Tiny amount of left pleural fluid.  Electronically Signed  By: Elberta Fortisaniel Boyle M.D.  On: 11/05/2013 00:46  Assessment    pelvic pain  f/u r ovarian cyst CT scan    Plan  pelvic ultrasound for further  evaluation. Will noitfy of result        Adam Phenix 11/29/2013, 11:39 AM

## 2013-11-30 ENCOUNTER — Ambulatory Visit
Admission: RE | Admit: 2013-11-30 | Discharge: 2013-11-30 | Disposition: A | Discharge status: Home / Self Care | Payer: Disability Insurance | Source: Ambulatory Visit | Attending: Neurology | Admitting: Neurology

## 2013-11-30 DIAGNOSIS — F329 Major depressive disorder, single episode, unspecified: Secondary | ICD-10-CM

## 2013-11-30 DIAGNOSIS — R32 Unspecified urinary incontinence: Secondary | ICD-10-CM

## 2013-11-30 DIAGNOSIS — J449 Chronic obstructive pulmonary disease, unspecified: Secondary | ICD-10-CM

## 2013-11-30 DIAGNOSIS — F32A Depression, unspecified: Secondary | ICD-10-CM

## 2013-12-02 ENCOUNTER — Other Ambulatory Visit: Payer: Self-pay | Admitting: Neurology

## 2013-12-02 MED ORDER — TRAMADOL HCL 50 MG PO TABS: 50.0000 mg | ORAL_TABLET | Freq: Four times a day (QID) | ORAL | Status: DC | PRN

## 2013-12-02 NOTE — Telephone Encounter (Signed)
Called pt and spoke with pt's husband Onalee HuaDavid to inform him per Dr. Terrace ArabiaYan that she is prescribing Tramadol for the pt and that she would not be able to prescribe any more pain medication. Pt's husband verbalized understanding.

## 2013-12-02 NOTE — Telephone Encounter (Signed)
Called pt to inform her per Dr. Terrace ArabiaYan that the doctor will write pt's pain meds, but pt still needs to find a pain management or continue with her primary care. I advised the pt the if she has any other problems, questions or concerns to call the office. Pt verbalized understanding.

## 2013-12-02 NOTE — Telephone Encounter (Signed)
Pt's husband called states pt is in the bed all the time and that the medication naproxen (NAPROSYN) 250 MG tablet did not work. Pt wants to see if Dr. Terrace ArabiaYan can call something else in and also that they are having trouble finding pain management clinic that will accept her insurance. Please call pt's husband Onalee HuaDavid concerning this matter. Thanks

## 2013-12-05 ENCOUNTER — Ambulatory Visit (HOSPITAL_COMMUNITY)

## 2013-12-06 ENCOUNTER — Ambulatory Visit: Admitting: Neurology

## 2013-12-07 ENCOUNTER — Inpatient Hospital Stay: Admission: RE | Admit: 2013-12-07 | Source: Ambulatory Visit

## 2013-12-09 ENCOUNTER — Ambulatory Visit: Admitting: Cardiology

## 2013-12-16 ENCOUNTER — Ambulatory Visit (HOSPITAL_COMMUNITY)
Admission: RE | Admit: 2013-12-16 | Discharge: 2013-12-16 | Disposition: A | Discharge status: Home / Self Care | Source: Ambulatory Visit | Attending: Obstetrics & Gynecology | Admitting: Obstetrics & Gynecology

## 2013-12-16 DIAGNOSIS — N83209 Unspecified ovarian cyst, unspecified side: Secondary | ICD-10-CM | POA: Insufficient documentation

## 2013-12-16 DIAGNOSIS — Z90710 Acquired absence of both cervix and uterus: Secondary | ICD-10-CM | POA: Insufficient documentation

## 2013-12-16 DIAGNOSIS — R102 Pelvic and perineal pain: Secondary | ICD-10-CM

## 2014-01-04 ENCOUNTER — Emergency Department (HOSPITAL_COMMUNITY)
Admission: EM | Admit: 2014-01-04 | Discharge: 2014-01-04 | Disposition: A | Discharge status: Home / Self Care | Attending: Emergency Medicine | Admitting: Emergency Medicine

## 2014-01-04 ENCOUNTER — Encounter (HOSPITAL_COMMUNITY): Payer: Self-pay | Admitting: Emergency Medicine

## 2014-01-04 DIAGNOSIS — Z8742 Personal history of other diseases of the female genital tract: Secondary | ICD-10-CM | POA: Insufficient documentation

## 2014-01-04 DIAGNOSIS — Z8701 Personal history of pneumonia (recurrent): Secondary | ICD-10-CM | POA: Insufficient documentation

## 2014-01-04 DIAGNOSIS — Z79899 Other long term (current) drug therapy: Secondary | ICD-10-CM | POA: Insufficient documentation

## 2014-01-04 DIAGNOSIS — G8929 Other chronic pain: Secondary | ICD-10-CM | POA: Insufficient documentation

## 2014-01-04 DIAGNOSIS — F319 Bipolar disorder, unspecified: Secondary | ICD-10-CM | POA: Insufficient documentation

## 2014-01-04 DIAGNOSIS — G40909 Epilepsy, unspecified, not intractable, without status epilepticus: Secondary | ICD-10-CM | POA: Insufficient documentation

## 2014-01-04 DIAGNOSIS — F17200 Nicotine dependence, unspecified, uncomplicated: Secondary | ICD-10-CM | POA: Insufficient documentation

## 2014-01-04 DIAGNOSIS — F410 Panic disorder [episodic paroxysmal anxiety] without agoraphobia: Secondary | ICD-10-CM | POA: Insufficient documentation

## 2014-01-04 DIAGNOSIS — J4489 Other specified chronic obstructive pulmonary disease: Secondary | ICD-10-CM | POA: Insufficient documentation

## 2014-01-04 DIAGNOSIS — G43909 Migraine, unspecified, not intractable, without status migrainosus: Secondary | ICD-10-CM

## 2014-01-04 DIAGNOSIS — J449 Chronic obstructive pulmonary disease, unspecified: Secondary | ICD-10-CM | POA: Insufficient documentation

## 2014-01-04 DIAGNOSIS — Z8611 Personal history of tuberculosis: Secondary | ICD-10-CM | POA: Insufficient documentation

## 2014-01-04 MED ORDER — SODIUM CHLORIDE 0.9 % IV BOLUS (SEPSIS)
500.0000 mL | Freq: Once | INTRAVENOUS | Status: AC
  Administered 2014-01-04: 1000 mL via INTRAVENOUS

## 2014-01-04 MED ORDER — METOCLOPRAMIDE HCL 5 MG/ML IJ SOLN
10.0000 mg | Freq: Once | INTRAMUSCULAR | Status: AC
Start: 2014-01-04 — End: 2014-01-04
  Administered 2014-01-04: 10 mg via INTRAVENOUS
  Filled 2014-01-04: qty 2

## 2014-01-04 MED ORDER — KETOROLAC TROMETHAMINE 30 MG/ML IJ SOLN
30.0000 mg | Freq: Once | INTRAMUSCULAR | Status: AC
  Administered 2014-01-04: 30 mg via INTRAVENOUS
  Filled 2014-01-04: qty 1

## 2014-01-04 MED ORDER — SODIUM CHLORIDE 0.9 % IV SOLN
INTRAVENOUS | Status: DC
  Administered 2014-01-04: 19:00:00 via INTRAVENOUS

## 2014-01-04 MED ORDER — ONDANSETRON HCL 4 MG/2ML IJ SOLN
4.0000 mg | Freq: Once | INTRAMUSCULAR | Status: AC
  Administered 2014-01-04: 4 mg via INTRAVENOUS
  Filled 2014-01-04: qty 2

## 2014-01-04 NOTE — ED Notes (Signed)
Pt states her head has been hurting since yesterday. vomiting today. States she is already on an antibiotic for a sinus infection

## 2014-01-04 NOTE — ED Provider Notes (Signed)
CSN: [id number]     Arrival date & time 01/04/14  1742 History  This chart was scribed for Kara MelterElliott L Emmagrace Runkel, MD by Chestine SporeSoijett Blue, ED Scribe. The patient was seen in room APA12/[facility]12 at 7:09 PM.    Chief Complaint  Patient presents with  . Migraine    The history is provided by the patient. No language interpreter was used.   HPI Comments: Kara Kelly is a 48 y.o. female who presents to the Emergency Department complaining of constant migraine onset yesterday with associated nausea and vomiting.  She states that she has been vomiting all morning long. She takes Zoloft in the morning and is not able to keep it down due to vomiting. She reports bilateral eye pain and light sensitivity. She reports left lower back pain, onset yesterday. She states that she has been sleeping to alleviate her pain. She states that she smokes, but has decreased recently. She states that she is on Augmentin for a recent sinus infection. She reports h/o stent placement in her kidney since 2003.   PCP- Milinda AntisURHAM, KAWANTA, MD  Past Medical History  Diagnosis Date  . Panic attack   . Depression   . Endometriosis   . Bipolar 1 disorder   . Headache(784.0)     daily  . Personality disorder   . Tuberculosis     been tested positive for it  . COPD (chronic obstructive pulmonary disease)   . Pneumonia complicating pregnancy   . Pneumonia     chronic per lung doctor  . Chronic neck and back pain    Past Surgical History  Procedure Laterality Date  . Appendectomy    . Cholecystectomy    . Abdominal hysterectomy    . Neck surgery    . Cholecystectomy     Family History  Problem Relation Age of Onset  . Kidney cancer Father   . Heart disease Mother    History  Substance Use Topics  . Smoking status: Current Every Day Smoker -- 1.00 packs/day    Types: Cigarettes  . Smokeless tobacco: Never Used  . Alcohol Use: No   OB History   Grav Para Term Preterm Abortions TAB SAB Ect Mult Living                  Review of Systems  Eyes: Positive for photophobia and pain.  Gastrointestinal: Positive for nausea and vomiting.  Neurological: Positive for headaches.  All other systems reviewed and are negative.  Allergies  Flexeril and Morphine and related  Home Medications   Prior to Admission medications   Medication Sig Start Date End Date Taking? Authorizing Provider  albuterol (PROVENTIL HFA;VENTOLIN HFA) 108 (90 BASE) MCG/ACT inhaler Inhale 2 puffs into the lungs every 6 (six) hours as needed for wheezing or shortness of breath.   Yes Historical Provider, MD  albuterol (PROVENTIL) (2.5 MG/3ML) 0.083% nebulizer solution Take 2.5 mg by nebulization every 6 (six) hours as needed for wheezing or shortness of breath.   Yes Historical Provider, MD  Ascorbic Acid (VITAMIN C PO) Take 1 tablet by mouth daily.   Yes Historical Provider, MD  asenapine (SAPHRIS) 5 MG SUBL 24 hr tablet Place 5 mg under the tongue at bedtime.   Yes Historical Provider, MD  CINNAMON PO Take 1 capsule by mouth daily.   Yes Historical Provider, MD  cloNIDine (CATAPRES) 0.1 MG tablet Take 0.1 mg by mouth 2 (two) times daily.   Yes Historical Provider, MD  Cyanocobalamin (VITAMIN B  12 PO) Take 1 tablet by mouth daily.   Yes Historical Provider, MD  ibuprofen (ADVIL,MOTRIN) 200 MG tablet Take 800 mg by mouth every 6 (six) hours as needed for moderate pain.   Yes Historical Provider, MD  LORazepam (ATIVAN) 1 MG tablet Take 1 mg by mouth 4 (four) times daily.    Yes Historical Provider, MD  prazosin (MINIPRESS) 2 MG capsule Take 2 mg by mouth at bedtime.   Yes Historical Provider, MD  Pyridoxine HCl (VITAMIN B-6 PO) Take 1 tablet by mouth daily.   Yes Historical Provider, MD  sertraline (ZOLOFT) 100 MG tablet Take 100 mg by mouth daily.   Yes Historical Provider, MD   BP 142/85  Pulse 79  Temp(Src) 98.1 F (36.7 C) (Oral)  Resp 16  Ht 5\' 11"  (1.803 m)  Wt 199 lb (90.266 kg)  BMI 27.77 kg/m2  SpO2 98% Physical Exam   Nursing note and vitals reviewed. Constitutional: She is oriented to person, place, and time. She appears well-developed and well-nourished.  HENT:  Head: Normocephalic and atraumatic.  Right Ear: Hearing, tympanic membrane, external ear and ear canal normal.  Left Ear: Hearing, tympanic membrane, external ear and ear canal normal.  Mouth/Throat: Oropharynx is clear and moist. No oropharyngeal exudate.  Eyes: Conjunctivae and EOM are normal. Pupils are equal, round, and reactive to light.  Neck: Normal range of motion and phonation normal. Neck supple.  No meningismus.  Cardiovascular: Normal rate, regular rhythm, normal heart sounds and intact distal pulses.   Pulmonary/Chest: Effort normal and breath sounds normal. She exhibits no tenderness.  Abdominal: Soft. She exhibits no distension. There is no tenderness. There is no guarding.  Musculoskeletal: Normal range of motion.  Neurological: She is alert and oriented to person, place, and time. She exhibits normal muscle tone.  Skin: Skin is warm and dry.  Psychiatric: She has a normal mood and affect. Her behavior is normal. Judgment and thought content normal.   ED Course  Procedures (including critical care time)  DIAGNOSTIC STUDIES: Oxygen Saturation is 98% on room air, normal by my interpretation.    COORDINATION OF CARE: 7:13 PM-Discussed treatment plan with pt at bedside and pt agreed to plan.   20:20- she reports no relief from medication. Reglan ordered.  9:22 PM- Re-check. Discussed option of Valproate, she declined it. Discussed use of prednisone for DJD of the cervical spine and she declined it.  Will discharge pt per request.    Medications  0.9 %  sodium chloride infusion ( Intravenous New Bag/Given 01/04/14 1915)  sodium chloride 0.9 % bolus 500 mL (0 mLs Intravenous Stopped 01/04/14 2018)  ketorolac (TORADOL) 30 MG/ML injection 30 mg (30 mg Intravenous Given 01/04/14 1936)  ondansetron (ZOFRAN) injection 4 mg (4 mg  Intravenous Given 01/04/14 1936)  metoCLOPramide (REGLAN) injection 10 mg (10 mg Intravenous Given 01/04/14 2029)    Patient Vitals for the past 24 hrs:  BP Temp Temp src Pulse Resp SpO2 Height Weight  01/04/14 2042 104/59 mmHg 97.7 F (36.5 C) Oral 66 16 92 % - -  01/04/14 1754 142/85 mmHg 98.1 F (36.7 C) Oral 79 16 98 % 5\' 11"  (1.803 m) 199 lb (90.266 kg)     EKG Interpretation None      MDM   Final diagnoses:  Migraine    Nonspecific headache, like in the by the patient to her prior migraines. Possible relation to degenerative joint disease of cervical spine.  Nursing Notes Reviewed/ Care Coordinated Applicable Imaging Reviewed  Interpretation of Laboratory Data incorporated into ED treatment  The patient appears reasonably screened and/or stabilized for discharge and I doubt any other medical condition or other [facility] requiring further screening, evaluation, or treatment in the ED at this time prior to discharge.  Plan: Home Medications- usual; Home Treatments- rest; return here if the recommended treatment, does not improve the symptoms; Recommended follow up- PCP prn   I personally performed the services described in this documentation, which was scribed in my presence. The recorded information has been reviewed and is accurate.     Kara Melter, MD 01/05/14 [phone]

## 2014-01-08 ENCOUNTER — Encounter: Payer: Self-pay | Admitting: Family Medicine

## 2014-01-08 ENCOUNTER — Ambulatory Visit (INDEPENDENT_AMBULATORY_CARE_PROVIDER_SITE_OTHER): Admitting: Family Medicine

## 2014-01-08 VITALS — BP 136/78 | HR 78 | Temp 98.6°F | Resp 16 | Ht 66.5 in | Wt 196.0 lb

## 2014-01-08 DIAGNOSIS — N390 Urinary tract infection, site not specified: Secondary | ICD-10-CM

## 2014-01-08 DIAGNOSIS — R300 Dysuria: Secondary | ICD-10-CM

## 2014-01-08 DIAGNOSIS — N200 Calculus of kidney: Secondary | ICD-10-CM

## 2014-01-08 LAB — URINALYSIS, MICROSCOPIC ONLY
Casts: NONE SEEN
Crystals: NONE SEEN
RBC / HPF: NONE SEEN RBC/hpf (ref <3)

## 2014-01-08 LAB — URINALYSIS, ROUTINE W REFLEX MICROSCOPIC
Glucose, UA: NEGATIVE mg/dL
HGB URINE DIPSTICK: NEGATIVE
NITRITE: NEGATIVE
Protein, ur: NEGATIVE mg/dL
SPECIFIC GRAVITY, URINE: 1.02 (ref 1.005–1.030)
UROBILINOGEN UA: 0.2 mg/dL (ref 0.0–1.0)
pH: 6 (ref 5.0–8.0)

## 2014-01-08 MED ORDER — CIPROFLOXACIN HCL 500 MG PO TABS: 500.0000 mg | ORAL_TABLET | Freq: Two times a day (BID) | ORAL | Status: DC

## 2014-01-08 MED ORDER — ALBUTEROL SULFATE (2.5 MG/3ML) 0.083% IN NEBU: 2.5000 mg | INHALATION_SOLUTION | Freq: Four times a day (QID) | RESPIRATORY_TRACT | Status: DC | PRN

## 2014-01-08 MED ORDER — TAMSULOSIN HCL 0.4 MG PO CAPS: 0.4000 mg | ORAL_CAPSULE | Freq: Every day | ORAL | Status: DC

## 2014-01-08 MED ORDER — HYDROCODONE-ACETAMINOPHEN 5-325 MG PO TABS: 1.0000 | ORAL_TABLET | Freq: Four times a day (QID) | ORAL | Status: DC | PRN

## 2014-01-08 NOTE — Assessment & Plan Note (Signed)
Urine sent for culture. Will start her on ciprofloxacin twice a day

## 2014-01-08 NOTE — Assessment & Plan Note (Signed)
There was no stone seen on the CT scan about 2 months ago the left side just a cystic lesion however she does have history of kidney stones. Her pain feels similar to previous as well as her symptoms. I will go ahead and start her on Flomax he was getting pain medication as well as antibiotic for possible urine infection as well. She will followup early next week for recheck.

## 2014-01-08 NOTE — Patient Instructions (Signed)
Start antibiotics for urine infection Take flomax to help you urinate Pain medication given F/U 1 week for recheck

## 2014-01-08 NOTE — Progress Notes (Signed)
Patient ID: Kara Kelly, female   DOB: 12/14/1965, 48 y.o.   MRN: [id number]   Subjective:    Patient ID: Kara Kelly, female    DOB: 04-25-1966, 48 y.o.   MRN: [id number]  Patient presents for 3 month F/U and Kidney stones  patient originally here for routine followup however she complains of left-sided flank pain and abdominal pain for the past 4 days. She was seen in the emergency room over the weekend states that he chewed her migraine but did not treat her abdominal pain. She states it feels similar to previous kidney stone she has had surgery on the left side secondary to stones. She did have a CT scan done about a month ago which showed a cyst on the left kidney as well as stones nonobstructing on the right side. She's had some nausea and vomiting associated in a few episodes of diarrhea. She's also had some mild dysuria but no blood in the urine.    Review Of Systems:  GEN- denies fatigue, fever, weight loss,weakness, recent illness HEENT- denies eye drainage, change in vision, nasal discharge, CVS- denies chest pain, palpitations RESP- denies SOB, cough, wheeze ABD- denies N/V, change in stools, +abd pain GU- + dysuria, hematuria, dribbling, incontinence MSK- +joint pain, muscle aches, injury Neuro- denies headache, dizziness, syncope, seizure activity       Objective:    BP 136/78  Pulse 78  Temp(Src) 98.6 F (37 C) (Oral)  Resp 16  Ht 5' 6.5" (1.689 m)  Wt 196 lb (88.905 kg)  BMI 31.16 kg/m2 GEN- NAD, alert and oriented x3 HEENT- PERRL, EOMI, non injected sclera, pink conjunctiva, MMM, oropharynx clear CVS- RRR, no murmur RESP-few scattered wheeze ABD-NABS,soft,TTP left flank, TTP suprapubic region, no mass palpated EXT- No edema Pulses- Radial 2+        Assessment & Plan:      Problem List Items Addressed This Visit   None    Visit Diagnoses   Dysuria    -  Primary    Relevant Orders       Urinalysis, Routine w reflex microscopic        Note: This dictation was prepared with Dragon dictation along with smaller phrase technology. Any transcriptional errors that result from this process are unintentional.

## 2014-01-10 LAB — URINE CULTURE
Colony Count: NO GROWTH
Organism ID, Bacteria: NO GROWTH

## 2014-01-15 ENCOUNTER — Ambulatory Visit: Admitting: Family Medicine

## 2014-01-20 ENCOUNTER — Emergency Department (HOSPITAL_COMMUNITY)
Admission: EM | Admit: 2014-01-20 | Discharge: 2014-01-20 | Disposition: A | Discharge status: Home / Self Care | Attending: Emergency Medicine | Admitting: Emergency Medicine

## 2014-01-20 ENCOUNTER — Emergency Department (HOSPITAL_COMMUNITY)

## 2014-01-20 ENCOUNTER — Encounter (HOSPITAL_COMMUNITY): Payer: Self-pay | Admitting: Emergency Medicine

## 2014-01-20 DIAGNOSIS — G8929 Other chronic pain: Secondary | ICD-10-CM | POA: Insufficient documentation

## 2014-01-20 DIAGNOSIS — Z79899 Other long term (current) drug therapy: Secondary | ICD-10-CM | POA: Insufficient documentation

## 2014-01-20 DIAGNOSIS — Y92410 Unspecified street and highway as the place of occurrence of the external cause: Secondary | ICD-10-CM | POA: Insufficient documentation

## 2014-01-20 DIAGNOSIS — F319 Bipolar disorder, unspecified: Secondary | ICD-10-CM | POA: Insufficient documentation

## 2014-01-20 DIAGNOSIS — J449 Chronic obstructive pulmonary disease, unspecified: Secondary | ICD-10-CM | POA: Insufficient documentation

## 2014-01-20 DIAGNOSIS — Z8742 Personal history of other diseases of the female genital tract: Secondary | ICD-10-CM | POA: Insufficient documentation

## 2014-01-20 DIAGNOSIS — S9031XA Contusion of right foot, initial encounter: Secondary | ICD-10-CM

## 2014-01-20 DIAGNOSIS — J4489 Other specified chronic obstructive pulmonary disease: Secondary | ICD-10-CM | POA: Insufficient documentation

## 2014-01-20 DIAGNOSIS — S9030XA Contusion of unspecified foot, initial encounter: Secondary | ICD-10-CM | POA: Insufficient documentation

## 2014-01-20 DIAGNOSIS — Y9339 Activity, other involving climbing, rappelling and jumping off: Secondary | ICD-10-CM | POA: Insufficient documentation

## 2014-01-20 DIAGNOSIS — Z8611 Personal history of tuberculosis: Secondary | ICD-10-CM | POA: Insufficient documentation

## 2014-01-20 DIAGNOSIS — F17200 Nicotine dependence, unspecified, uncomplicated: Secondary | ICD-10-CM | POA: Insufficient documentation

## 2014-01-20 DIAGNOSIS — L905 Scar conditions and fibrosis of skin: Secondary | ICD-10-CM | POA: Insufficient documentation

## 2014-01-20 DIAGNOSIS — Z792 Long term (current) use of antibiotics: Secondary | ICD-10-CM | POA: Insufficient documentation

## 2014-01-20 DIAGNOSIS — Z8701 Personal history of pneumonia (recurrent): Secondary | ICD-10-CM | POA: Insufficient documentation

## 2014-01-20 DIAGNOSIS — X58XXXA Exposure to other specified factors, initial encounter: Secondary | ICD-10-CM | POA: Insufficient documentation

## 2014-01-20 MED ORDER — MELOXICAM 7.5 MG PO TABS: 7.5000 mg | ORAL_TABLET | Freq: Every day | ORAL | Status: DC

## 2014-01-20 MED ORDER — OXYCODONE-ACETAMINOPHEN 5-325 MG PO TABS: 1.0000 | ORAL_TABLET | ORAL | Status: DC | PRN

## 2014-01-20 MED ORDER — OXYCODONE-ACETAMINOPHEN 5-325 MG PO TABS
1.0000 | ORAL_TABLET | Freq: Once | ORAL | Status: AC
  Administered 2014-01-20: 1 via ORAL
  Filled 2014-01-20: qty 1

## 2014-01-20 NOTE — ED Provider Notes (Signed)
CSN: [id number]     Arrival date & time 01/20/14  1209 History  This chart was scribed for Burgess AmorJulie Idol, PA, working with Juliet RudeNathan R. Rubin PayorPickering, MD by Chestine SporeSoijett Blue, ED Scribe. The patient was seen in room APFT23/APFT23 at 2:06 PM.     Chief Complaint  Patient presents with  . Foot Pain    The history is provided by the patient. No language interpreter was used.    Marland Kitchen.HPI Comments: Kara Kelly is a 48 y.o. female who presents to the Emergency Department complaining of painful, right foot pain onset 1.5 weeks ago. Pt states that she re injured her foot by jumping off a pickup truck onto rocks. She states that she had shoes on when the injury occurred.  Pt states that she is not able to bear weight or walk on her right foot. She states that her foot pain is sensitive to the touch. She states that the injury it did not bruise. She denies any associated symptoms.   She states that she has an appointment with a Podiatrist on the 17th of this month. She states that she had a heel spur two years ago and the treatment that was given did not help. She states that she had a hairline fracture of this same heel 2 years ago. She states that she has not had any X-rays of the foot taken.   She states that [facility]    Past Medical History  Diagnosis Date  . Panic attack   . Depression   . Endometriosis   . Bipolar 1 disorder   . Headache(784.0)     daily  . Personality disorder   . Tuberculosis     been tested positive for it  . COPD (chronic obstructive pulmonary disease)   . Pneumonia complicating pregnancy   . Pneumonia     chronic per lung doctor  . Chronic neck and back pain    Past Surgical History  Procedure Laterality Date  . Appendectomy    . Cholecystectomy    . Abdominal hysterectomy    . Neck surgery    . Cholecystectomy     Family History  Problem Relation Age of Onset  . Kidney cancer Father   . Heart disease Mother    History   Substance Use Topics  . Smoking status: Current Every Day Smoker -- 1.00 packs/day    Types: Cigarettes  . Smokeless tobacco: Never Used  . Alcohol Use: No   OB History   Grav Para Term Preterm Abortions TAB SAB Ect Mult Living                 Review of Systems  Constitutional: Negative for fever.  Musculoskeletal: Positive for arthralgias (right heel) and joint swelling. Negative for myalgias.  Neurological: Negative for weakness and numbness.     Allergies  Flexeril and Morphine and related  Home Medications   Prior to Admission medications   Medication Sig Start Date End Date Taking? Authorizing Provider  albuterol (PROVENTIL HFA;VENTOLIN HFA) 108 (90 BASE) MCG/ACT inhaler Inhale 2 puffs into the lungs every 6 (six) hours as needed for wheezing or shortness of breath.   Yes Historical Provider, MD  Ascorbic Acid (VITAMIN C PO) Take 1 tablet by mouth daily.   Yes Historical Provider, MD  asenapine (SAPHRIS) 5 MG SUBL 24 hr tablet Place 5 mg under the tongue at bedtime.   Yes Historical Provider, MD  CINNAMON PO Take 1  capsule by mouth daily.   Yes Historical Provider, MD  cloNIDine (CATAPRES) 0.1 MG tablet Take 0.1 mg by mouth 2 (two) times daily.   Yes Historical Provider, MD  Cyanocobalamin (VITAMIN B 12 PO) Take 1 tablet by mouth daily.   Yes Historical Provider, MD  ibuprofen (ADVIL,MOTRIN) 800 MG tablet Take 800 mg by mouth 2 (two) times daily.   Yes Historical Provider, MD  LORazepam (ATIVAN) 1 MG tablet Take 1 mg by mouth 4 (four) times daily.    Yes Historical Provider, MD  prazosin (MINIPRESS) 2 MG capsule Take 2 mg by mouth at bedtime.   Yes Historical Provider, MD  Pyridoxine HCl (VITAMIN B-6 PO) Take 1 tablet by mouth daily.   Yes Historical Provider, MD  sertraline (ZOLOFT) 100 MG tablet Take 100 mg by mouth daily.   Yes Historical Provider, MD  albuterol (PROVENTIL) (2.5 MG/3ML) 0.083% nebulizer solution Take 3 mLs (2.5 mg total) by nebulization every 6 (six)  hours as needed for wheezing or shortness of breath. 01/08/14   Salley ScarletKawanta F Saunders, MD  ciprofloxacin (CIPRO) 500 MG tablet Take 1 tablet (500 mg total) by mouth 2 (two) times daily. 01/08/14   Salley ScarletKawanta F , MD  meloxicam (MOBIC) 7.5 MG tablet Take 1 tablet (7.5 mg total) by mouth daily. 01/20/14   Burgess AmorJulie Idol, PA-C  oxyCODONE-acetaminophen (PERCOCET/ROXICET) 5-325 MG per tablet Take 1 tablet by mouth every 4 (four) hours as needed for severe pain. 01/20/14   Burgess AmorJulie Idol, PA-C   BP 117/62  Pulse 74  Temp(Src) 98.7 F (37.1 C) (Oral)  Resp 18  Ht 5\' 10"  (1.778 m)  Wt 190 lb (86.183 kg)  BMI 27.26 kg/m2  SpO2 97%  Physical Exam  Nursing note and vitals reviewed. Constitutional: She appears well-developed and well-nourished.  HENT:  Head: Normocephalic and atraumatic.  Eyes: Conjunctivae are normal.  Neck: Normal range of motion.  Cardiovascular: Normal rate, regular rhythm, normal heart sounds and intact distal pulses.   Pulmonary/Chest: Effort normal and breath sounds normal. She has no wheezes.  Abdominal: Soft. Bowel sounds are normal. There is no tenderness.  Musculoskeletal: Normal range of motion. She exhibits tenderness (right heel).  Tender to palpation along right plantar heel. No bruising or induration or visible sign of trauma. Calcaneous intact. Less than 2 seconds distal cap refill. Dorsalis pedis pulses intact.   Neurological: She is alert.  Skin: Skin is warm and dry.  Psychiatric: She has a normal mood and affect.    ED Course  Procedures (including critical care time) DIAGNOSTIC STUDIES: Oxygen Saturation is 97% on room air, normal by my interpretation.    COORDINATION OF CARE: 2:11 PM-Discussed treatment plan which includes Percocet and X-rays of the right heel, and crutches with pt at bedside and pt agreed to plan.   Dg Os Calcis Right  01/20/2014   CLINICAL DATA:  Right heel pain after fall.  EXAM: RIGHT OS CALCIS - 2+ VIEW  COMPARISON:  Foot x-rays from  10/17/2012.  FINDINGS: No evidence for calcaneal fracture. No worrisome lytic or sclerotic osseous abnormality.  IMPRESSION: No acute bony abnormality.   Electronically Signed   By: Kennith CenterEric  Mansell M.D.   On: 01/20/2014 14:50      EKG Interpretation None      MDM   Final diagnoses:  Contusion of heel, right, initial encounter    Patients labs and/or radiological studies were viewed and considered during the medical decision making and disposition process. Pt was provided crutches,  Xray  reviewed and discussed with her. Encouraged ice, elevation, mobic and percocet prescribed.  I personally performed the services described in this documentation, which was scribed in my presence. The recorded information has been reviewed and is accurate.    Burgess Amor, PA-C 01/21/14 0840

## 2014-01-20 NOTE — Discharge Instructions (Signed)
Foot Contusion A foot contusion is a deep bruise to the foot. Contusions are the result of an injury that caused bleeding under the skin. The contusion may turn blue, purple, or yellow. Minor injuries will give you a painless contusion, but more severe contusions may stay painful and swollen for a few weeks. CAUSES  A foot contusion comes from a direct blow to that area, such as a heavy object falling on the foot. SYMPTOMS   Swelling of the foot.  Discoloration of the foot.  Tenderness or soreness of the foot. DIAGNOSIS  You will have a physical exam and will be asked about your history. You may need an X-ray of your foot to look for a broken bone (fracture).  TREATMENT  An elastic wrap may be recommended to support your foot. Resting, elevating, and applying cold compresses to your foot are often the best treatments for a foot contusion. Over-the-counter medicines may also be recommended for pain control. HOME CARE INSTRUCTIONS   Put ice on the injured area.  Put ice in a plastic bag.  Place a towel between your skin and the bag.  Leave the ice on for 15-20 minutes, 03-04 times a day.  Only take over-the-counter or prescription medicines for pain, discomfort, or fever as directed by your caregiver.  If told, use an elastic wrap as directed. This can help reduce swelling. You may remove the wrap for sleeping, showering, and bathing. If your toes become numb, cold, or blue, take the wrap off and reapply it more loosely.  Elevate your foot with pillows to reduce swelling.  Try to avoid standing or walking while the foot is painful. Do not resume use until instructed by your caregiver. Then, begin use gradually. If pain develops, decrease use. Gradually increase activities that do not cause discomfort until you have normal use of your foot.  See your caregiver as directed. It is very important to keep all follow-up appointments in order to avoid any lasting problems with your foot,  including long-term (chronic) pain. SEEK IMMEDIATE MEDICAL CARE IF:   You have increased redness, swelling, or pain in your foot.  Your swelling or pain is not relieved with medicines.  You have loss of feeling in your foot or are unable to move your toes.  Your foot turns cold or blue.  You have pain when you move your toes.  Your foot becomes warm to the touch.  Your contusion does not improve in 2 days. MAKE SURE YOU:   Understand these instructions.  Will watch your condition.  Will get help right away if you are not doing well or get worse. Document Released: 05/09/2006 Document Revised: 01/17/2012 Document Reviewed: 06/21/2011 [facility] Patient Information 2015 [place] [place]. This information is not intended to replace advice given to you by your health care provider. Make sure you discuss any questions you have with your health care provider.   Apply a heat pad to your foot 20 minutes several times daily.   You may take the oxycodone prescribed for pain relief.  This will make you drowsy - do not drive within 4 hours of taking this medication.

## 2014-01-20 NOTE — ED Notes (Signed)
Pt reports stepped on a rock 1.5 weeks ago. Pt reports ever since c/o right heel pain. Pt reports pain increased last two days. Pt reports not able to bear weight on right foot. Mild swelling and tenderness noted to right heel. Cns intact. Full ROM of right foot. No puncture site noted. nad noted.

## 2014-01-21 NOTE — ED Provider Notes (Signed)
Medical screening examination/treatment/procedure(s) were performed by non-physician practitioner and as supervising physician I was immediately available for consultation/collaboration.   EKG Interpretation None       Juliet RudeNathan R. Rubin PayorPickering, MD 01/21/14 [phone]

## 2014-02-13 ENCOUNTER — Telehealth: Payer: Self-pay | Admitting: *Deleted

## 2014-02-13 NOTE — Telephone Encounter (Signed)
Called pt to get more information as to why needing referral to Dr. Corliss BlackerJenkins General Surgeon, pt stated that everytime she eats she has a hard time swelling, food gets stuck and center of chest hurts, says it takes a few mins for her food to go down..?ok to do referral or would you like for her to come in for office visit first.

## 2014-02-13 NOTE — Telephone Encounter (Signed)
Message copied by Samuella CotaSIMMONS, [place] T on Thu Feb 13, 2014  4:20 PM ------      Message from: Harriet MassonOBERTS, CHELSEA N      Created: Wed Feb 12, 2014  3:02 PM      Regarding: Referral       Contact: [phone]       Pt is needing a referral sent to Dr Lovell SheehanJenkins office  ------

## 2014-02-14 NOTE — Telephone Encounter (Signed)
She needs to schedule a f/u with GI, I sent her to Dr. Lysle Moralesehaan[facility] in January, they do these procedures, she can just call for appointment this is not a new problem

## 2014-02-14 NOTE — Telephone Encounter (Signed)
lmtrc

## 2014-02-17 NOTE — Telephone Encounter (Signed)
LMTRC

## 2014-02-18 NOTE — Telephone Encounter (Signed)
LMTRC

## 2014-02-19 ENCOUNTER — Other Ambulatory Visit: Payer: Self-pay | Admitting: Podiatry

## 2014-02-19 NOTE — Telephone Encounter (Signed)
Pt called back and is aware that she can make appt with Dr. Renae Fickleheman with dyphasia since is an occuring problem

## 2014-02-27 ENCOUNTER — Other Ambulatory Visit (HOSPITAL_COMMUNITY): Payer: Self-pay | Admitting: Podiatry

## 2014-02-27 DIAGNOSIS — G5751 Tarsal tunnel syndrome, right lower limb: Secondary | ICD-10-CM

## 2014-02-27 DIAGNOSIS — M84376A Stress fracture, unspecified foot, initial encounter for fracture: Secondary | ICD-10-CM

## 2014-03-04 ENCOUNTER — Ambulatory Visit (HOSPITAL_COMMUNITY)
Admission: RE | Admit: 2014-03-04 | Discharge: 2014-03-04 | Disposition: A | Discharge status: Home / Self Care | Source: Ambulatory Visit | Attending: Podiatry | Admitting: Podiatry

## 2014-03-04 DIAGNOSIS — M659 Synovitis and tenosynovitis, unspecified: Secondary | ICD-10-CM | POA: Insufficient documentation

## 2014-03-04 DIAGNOSIS — M65979 Unspecified synovitis and tenosynovitis, unspecified ankle and foot: Secondary | ICD-10-CM | POA: Insufficient documentation

## 2014-03-04 DIAGNOSIS — M25579 Pain in unspecified ankle and joints of unspecified foot: Secondary | ICD-10-CM | POA: Insufficient documentation

## 2014-03-04 DIAGNOSIS — M722 Plantar fascial fibromatosis: Secondary | ICD-10-CM | POA: Insufficient documentation

## 2014-03-04 DIAGNOSIS — G5751 Tarsal tunnel syndrome, right lower limb: Secondary | ICD-10-CM

## 2014-03-04 DIAGNOSIS — M84376A Stress fracture, unspecified foot, initial encounter for fracture: Secondary | ICD-10-CM

## 2014-03-06 ENCOUNTER — Encounter (HOSPITAL_COMMUNITY): Payer: Self-pay | Admitting: Emergency Medicine

## 2014-03-06 ENCOUNTER — Emergency Department (HOSPITAL_COMMUNITY)
Admission: EM | Admit: 2014-03-06 | Discharge: 2014-03-06 | Discharge status: Against Medical Advice | Attending: Emergency Medicine | Admitting: Emergency Medicine

## 2014-03-06 DIAGNOSIS — J4489 Other specified chronic obstructive pulmonary disease: Secondary | ICD-10-CM | POA: Insufficient documentation

## 2014-03-06 DIAGNOSIS — J449 Chronic obstructive pulmonary disease, unspecified: Secondary | ICD-10-CM | POA: Insufficient documentation

## 2014-03-06 DIAGNOSIS — R21 Rash and other nonspecific skin eruption: Secondary | ICD-10-CM | POA: Insufficient documentation

## 2014-03-06 DIAGNOSIS — F17200 Nicotine dependence, unspecified, uncomplicated: Secondary | ICD-10-CM | POA: Insufficient documentation

## 2014-03-06 NOTE — ED Notes (Signed)
Pt called without answer for triage.

## 2014-03-06 NOTE — ED Notes (Signed)
Pt c/o itching rash to legs, arms, and trunk of body since Monday.

## 2014-03-06 NOTE — ED Notes (Signed)
Pt called for room placement.  No response 

## 2014-03-12 ENCOUNTER — Ambulatory Visit: Admitting: Neurology

## 2014-03-27 ENCOUNTER — Emergency Department (HOSPITAL_COMMUNITY)

## 2014-03-27 ENCOUNTER — Emergency Department (HOSPITAL_COMMUNITY)
Admission: EM | Admit: 2014-03-27 | Discharge: 2014-03-27 | Disposition: A | Discharge status: Home / Self Care | Attending: Emergency Medicine | Admitting: Emergency Medicine

## 2014-03-27 ENCOUNTER — Encounter (HOSPITAL_COMMUNITY): Payer: Self-pay | Admitting: Emergency Medicine

## 2014-03-27 DIAGNOSIS — Z8701 Personal history of pneumonia (recurrent): Secondary | ICD-10-CM | POA: Diagnosis not present

## 2014-03-27 DIAGNOSIS — Z8611 Personal history of tuberculosis: Secondary | ICD-10-CM | POA: Insufficient documentation

## 2014-03-27 DIAGNOSIS — G43009 Migraine without aura, not intractable, without status migrainosus: Secondary | ICD-10-CM | POA: Diagnosis not present

## 2014-03-27 DIAGNOSIS — Z791 Long term (current) use of non-steroidal anti-inflammatories (NSAID): Secondary | ICD-10-CM | POA: Diagnosis not present

## 2014-03-27 DIAGNOSIS — F410 Panic disorder [episodic paroxysmal anxiety] without agoraphobia: Secondary | ICD-10-CM | POA: Diagnosis not present

## 2014-03-27 DIAGNOSIS — G8929 Other chronic pain: Secondary | ICD-10-CM | POA: Diagnosis not present

## 2014-03-27 DIAGNOSIS — F17200 Nicotine dependence, unspecified, uncomplicated: Secondary | ICD-10-CM | POA: Diagnosis not present

## 2014-03-27 DIAGNOSIS — G43909 Migraine, unspecified, not intractable, without status migrainosus: Secondary | ICD-10-CM | POA: Insufficient documentation

## 2014-03-27 DIAGNOSIS — J440 Chronic obstructive pulmonary disease with acute lower respiratory infection: Secondary | ICD-10-CM | POA: Diagnosis not present

## 2014-03-27 DIAGNOSIS — Z792 Long term (current) use of antibiotics: Secondary | ICD-10-CM | POA: Insufficient documentation

## 2014-03-27 DIAGNOSIS — F319 Bipolar disorder, unspecified: Secondary | ICD-10-CM | POA: Insufficient documentation

## 2014-03-27 DIAGNOSIS — J40 Bronchitis, not specified as acute or chronic: Secondary | ICD-10-CM

## 2014-03-27 DIAGNOSIS — Z8742 Personal history of other diseases of the female genital tract: Secondary | ICD-10-CM | POA: Insufficient documentation

## 2014-03-27 DIAGNOSIS — Z79899 Other long term (current) drug therapy: Secondary | ICD-10-CM | POA: Diagnosis not present

## 2014-03-27 DIAGNOSIS — J209 Acute bronchitis, unspecified: Secondary | ICD-10-CM | POA: Insufficient documentation

## 2014-03-27 HISTORY — DX: Migraine, unspecified, not intractable, without status migrainosus: G43.909

## 2014-03-27 MED ORDER — SODIUM CHLORIDE 0.9 % IV SOLN
1000.0000 mL | INTRAVENOUS | Status: DC
  Administered 2014-03-27: 1000 mL via INTRAVENOUS

## 2014-03-27 MED ORDER — PROMETHAZINE HCL 25 MG/ML IJ SOLN
25.0000 mg | Freq: Once | INTRAMUSCULAR | Status: AC
  Administered 2014-03-27: 25 mg via INTRAMUSCULAR

## 2014-03-27 MED ORDER — DEXAMETHASONE SODIUM PHOSPHATE 4 MG/ML IJ SOLN
10.0000 mg | Freq: Once | INTRAMUSCULAR | Status: AC
  Administered 2014-03-27: 10 mg via INTRAVENOUS
  Filled 2014-03-27: qty 3

## 2014-03-27 MED ORDER — DIPHENHYDRAMINE HCL 50 MG/ML IJ SOLN
25.0000 mg | Freq: Once | INTRAMUSCULAR | Status: AC
  Administered 2014-03-27: 25 mg via INTRAVENOUS
  Filled 2014-03-27: qty 1

## 2014-03-27 MED ORDER — METOCLOPRAMIDE HCL 5 MG/ML IJ SOLN
10.0000 mg | Freq: Once | INTRAMUSCULAR | Status: AC
  Administered 2014-03-27: 10 mg via INTRAVENOUS
  Filled 2014-03-27: qty 2

## 2014-03-27 MED ORDER — KETOROLAC TROMETHAMINE 30 MG/ML IJ SOLN
30.0000 mg | Freq: Once | INTRAMUSCULAR | Status: AC
  Administered 2014-03-27: 30 mg via INTRAVENOUS
  Filled 2014-03-27: qty 1

## 2014-03-27 MED ORDER — SODIUM CHLORIDE 0.9 % IV SOLN
1000.0000 mL | Freq: Once | INTRAVENOUS | Status: AC
  Administered 2014-03-27: 1000 mL via INTRAVENOUS

## 2014-03-27 MED ORDER — PROMETHAZINE HCL 25 MG/ML IJ SOLN
INTRAMUSCULAR | Status: AC
  Filled 2014-03-27: qty 1

## 2014-03-27 MED ORDER — ALBUTEROL SULFATE HFA 108 (90 BASE) MCG/ACT IN AERS: 2.0000 | INHALATION_SPRAY | RESPIRATORY_TRACT | Status: DC | PRN

## 2014-03-27 MED ORDER — SULFAMETHOXAZOLE-TRIMETHOPRIM 800-160 MG PO TABS: 1.0000 | ORAL_TABLET | Freq: Two times a day (BID) | ORAL | Status: AC

## 2014-03-27 MED ORDER — MAGNESIUM SULFATE 40 MG/ML IJ SOLN
2.0000 g | Freq: Once | INTRAMUSCULAR | Status: AC
  Administered 2014-03-27: 2 g via INTRAVENOUS
  Filled 2014-03-27: qty 50

## 2014-03-27 MED ORDER — ONDANSETRON HCL 4 MG/2ML IJ SOLN
4.0000 mg | Freq: Once | INTRAMUSCULAR | Status: AC
  Administered 2014-03-27: 4 mg via INTRAVENOUS
  Filled 2014-03-27: qty 2

## 2014-03-27 NOTE — ED Provider Notes (Signed)
CSN: [id number]     Arrival date & time 03/27/14  0618 History  This chart was scribed for Ward Givens, MD, by Yevette Edwards, ED Scribe. This patient was seen in room APA03/APA03 and the patient's care was started at 7:06 AM.   First MD Initiated Contact with Patient 03/27/14 [phone]     Chief Complaint  Patient presents with  . Migraine    The history is provided by the patient. No language interpreter was used.   HPI Comments: Kara Kelly is a 48 y.o. female who presents to the Emergency Department complaining of a headache which began approximately three days ago at her occiput and gradually moved frontally. She characterizes the pain as "sharp" and "throbbing."  Kara Kelly reports the headache has been associated with nausea, emesis, photophobia, and sensitivity to noise. She denies numbness/paresthesia or visual changes. The headache is similar to previous headaches; she reports they occur approximately twice a year.  Kara Kelly, who smokes a pack a day,  also reports SOB which began a week ago. She endorses a cough productive of yellowish-white phlegm which has also persisted for a week. She also endorses wheezing. She denies improvement with use of a nebulizer.  She also denies a fever; in the ED her temperature is 98.1 F.   Dr. Jeanice Lim is her PCP.     Past Medical History  Diagnosis Date  . Panic attack   . Depression   . Endometriosis   . Bipolar 1 disorder   . Headache(784.0)     daily  . Personality disorder   . Tuberculosis     been tested positive for it  . COPD (chronic obstructive pulmonary disease)   . Pneumonia complicating pregnancy   . Pneumonia     chronic per lung doctor  . Chronic neck and back pain   . Migraine    Past Surgical History  Procedure Laterality Date  . Appendectomy    . Cholecystectomy    . Abdominal hysterectomy    . Neck surgery    . Cholecystectomy     Family History  Problem Relation Age of Onset  . Kidney  cancer Father   . Heart disease Mother    History  Substance Use Topics  . Smoking status: Current Every Day Smoker -- 1.00 packs/day    Types: Cigarettes  . Smokeless tobacco: Never Used  . Alcohol Use: No   Kara Kelly is unemployed. She is seeking disability because she "cannot stand up for a very long time."   No OB history provided.  Review of Systems  Constitutional: Negative for fever.  Eyes: Positive for photophobia. Negative for visual disturbance.  Respiratory: Positive for cough and shortness of breath.   Gastrointestinal: Positive for nausea and vomiting.  Neurological: Positive for headaches. Negative for numbness.  All other systems reviewed and are negative.   Allergies  Flexeril and Morphine and related  Home Medications   Prior to Admission medications   Medication Sig Start Date End Date Taking? Authorizing Provider  albuterol (PROVENTIL HFA;VENTOLIN HFA) 108 (90 BASE) MCG/ACT inhaler Inhale 2 puffs into the lungs every 6 (six) hours as needed for wheezing or shortness of breath.    Historical Provider, MD  albuterol (PROVENTIL) (2.5 MG/3ML) 0.083% nebulizer solution Take 3 mLs (2.5 mg total) by nebulization every 6 (six) hours as needed for wheezing or shortness of breath. 01/08/14   Salley Scarlet, MD  Ascorbic Acid (VITAMIN C PO) Take 1 tablet by mouth  daily.    Historical Provider, MD  asenapine (SAPHRIS) 5 MG SUBL 24 hr tablet Place 5 mg under the tongue at bedtime.    Historical Provider, MD  CINNAMON PO Take 1 capsule by mouth daily.    Historical Provider, MD  ciprofloxacin (CIPRO) 500 MG tablet Take 1 tablet (500 mg total) by mouth 2 (two) times daily. 01/08/14   Salley Scarlet, MD  cloNIDine (CATAPRES) 0.1 MG tablet Take 0.1 mg by mouth 2 (two) times daily.    Historical Provider, MD  Cyanocobalamin (VITAMIN B 12 PO) Take 1 tablet by mouth daily.    Historical Provider, MD  ibuprofen (ADVIL,MOTRIN) 800 MG tablet Take 800 mg by mouth 2 (two)  times daily.    Historical Provider, MD  LORazepam (ATIVAN) 1 MG tablet Take 1 mg by mouth 4 (four) times daily.     Historical Provider, MD  meloxicam (MOBIC) 7.5 MG tablet Take 1 tablet (7.5 mg total) by mouth daily. 01/20/14   Burgess Amor, PA-C  oxyCODONE-acetaminophen (PERCOCET/ROXICET) 5-325 MG per tablet Take 1 tablet by mouth every 4 (four) hours as needed for severe pain. 01/20/14   Burgess Amor, PA-C  prazosin (MINIPRESS) 2 MG capsule Take 2 mg by mouth at bedtime.    Historical Provider, MD  Pyridoxine HCl (VITAMIN B-6 PO) Take 1 tablet by mouth daily.    Historical Provider, MD  sertraline (ZOLOFT) 100 MG tablet Take 100 mg by mouth daily.    Historical Provider, MD   Triage Vitals: BP 148/76  Pulse 88  Temp(Src) 98.1 F (36.7 C) (Oral)  Resp 26  Ht  (1.753 m)  Wt 190 lb (86.183 kg)  BMI 28.05 kg/m2  SpO2 96%  Vital signs normal    Physical Exam  Nursing note and vitals reviewed. Constitutional: She is oriented to person, place, and time. She appears well-developed and well-nourished.  Non-toxic appearance. She does not appear ill.  Pt sitting in a dark room dry heaving.   HENT:  Head: Normocephalic and atraumatic.  Right Ear: External ear normal.  Left Ear: External ear normal.  Nose: Nose normal. No mucosal edema or rhinorrhea.  Mouth/Throat: Oropharynx is clear and moist and mucous membranes are normal. No dental abscesses or uvula swelling. No oropharyngeal exudate.  Eyes: Conjunctivae and EOM are normal. Pupils are equal, round, and reactive to light.  Neck: Normal range of motion and full passive range of motion without pain. Neck supple. No tracheal deviation present.  Cardiovascular: Normal rate, regular rhythm and normal heart sounds.  Exam reveals no gallop and no friction rub.   No murmur heard. Pulmonary/Chest: Effort normal and breath sounds normal. No respiratory distress. She has no wheezes. She has no rhonchi. She has no rales. She exhibits no tenderness  and no crepitus.  Abdominal: Soft. Normal appearance and bowel sounds are normal. She exhibits no distension. There is no tenderness. There is no rebound and no guarding.  Musculoskeletal: Normal range of motion. She exhibits no edema and no tenderness.  Moves all extremities well.   Neurological: She is alert and oriented to person, place, and time. She has normal strength. No cranial nerve deficit.  Skin: Skin is warm, dry and intact. No rash noted. No erythema. No pallor.  Psychiatric: She has a normal mood and affect. Her speech is normal and behavior is normal. Her mood appears not anxious.    ED Course  Procedures (including critical care time)  Medications  ondansetron (ZOFRAN) injection 4 mg (4  mg Intravenous Given 03/27/14 0654)  0.9 %  sodium chloride infusion (0 mLs Intravenous Stopped 03/27/14 0833)  metoCLOPramide (REGLAN) injection 10 mg (10 mg Intravenous Given 03/27/14 0727)  diphenhydrAMINE (BENADRYL) injection 25 mg (25 mg Intravenous Given 03/27/14 0727)  ketorolac (TORADOL) 30 MG/ML injection 30 mg (30 mg Intravenous Given 03/27/14 0727)  dexamethasone (DECADRON) injection 10 mg (10 mg Intravenous Given 03/27/14 0851)  magnesium sulfate IVPB 2 g 50 mL (0 g Intravenous Stopped 03/27/14 0936)  promethazine (PHENERGAN) injection 25 mg (25 mg Intramuscular Given 03/27/14 1013)     DIAGNOSTIC STUDIES: Oxygen Saturation is 96% on room air, normal by my interpretation.    COORDINATION OF CARE:  7:16 AM- Discussed treatment plan with patient, and the patient agreed to the plan. The plan includes medication and a chest x-ray.   8:13 am- Rechecked pt. Pt states not improved. Still has a headache. More medications ordered.   Pt had CXR done once her headache improved.   10:37 AM- Rechecked pt. Pt improved and requests an inhaler. She currently only has a nebulizer. Will also prescribe pt antibiotics.     Dg Chest 2 View  03/27/2014   CLINICAL DATA:  Cough and shortness of  breath for 3 days; history of tobacco use and COPD  EXAM: CHEST  2 VIEW  COMPARISON:  PA and lateral chest x-ray of November 18, 2013  FINDINGS: The lungs are adequately inflated. There is no focal infiltrate. The interstitial markings are coarse, but this is not new. The heart and pulmonary vascularity are within the limits of normal. There is no pleural effusion. There is stable wedge compression of the body of L1 with loss of height anteriorly of 25%.  IMPRESSION: There is no evidence of CHF nor alveolar pneumonia. One cannot exclude acute bronchitis superimposed upon known COPD. If the patient's symptoms persist, follow-up films would be useful.   Electronically Signed   By: David  [place]   On: 03/27/2014 10:01      EKG Interpretation None      MDM   Final diagnoses:  Migraine without aura and responsive to treatment  Bronchitis    Discharge Medication List as of 03/27/2014 10:40 AM    START taking these medications   Details  !! albuterol (PROVENTIL HFA;VENTOLIN HFA) 108 (90 BASE) MCG/ACT inhaler Inhale 2 puffs into the lungs every 4 (four) hours as needed., Starting 03/27/2014, Until Discontinued, Print    sulfamethoxazole-trimethoprim (BACTRIM DS,SEPTRA DS) 800-160 MG per tablet Take 1 tablet by mouth 2 (two) times daily., Starting 03/27/2014, Last dose on Thu 04/03/14, Print     !! - Potential duplicate medications found. Please discuss with provider.      Plan discharge   I personally performed the services described in this documentation, which was scribed in my presence. The recorded information has been reviewed and considered.  Devoria Albe, MD, Armando Gang     Ward Givens, MD 03/27/14 [phone]

## 2014-03-27 NOTE — Discharge Instructions (Signed)
Drink plenty of fluids. Use the inhaler as needed for wheezing or shortness of breath. Take the antibiotic until gone. TRY TO STOP SMOKING!!!! Recheck if you get worse such as high fever, struggle to breathe.

## 2014-03-27 NOTE — ED Notes (Signed)
Patient complains of migraine headache with nausea and vomiting. Reports sensitivity to light and sound.

## 2014-05-30 ENCOUNTER — Ambulatory Visit (INDEPENDENT_AMBULATORY_CARE_PROVIDER_SITE_OTHER): Admitting: Family Medicine

## 2014-05-30 ENCOUNTER — Encounter: Payer: Self-pay | Admitting: Family Medicine

## 2014-05-30 VITALS — BP 120/86 | HR 96 | Temp 98.1°F | Resp 20 | Wt 190.0 lb

## 2014-05-30 DIAGNOSIS — Z113 Encounter for screening for infections with a predominantly sexual mode of transmission: Secondary | ICD-10-CM

## 2014-05-30 DIAGNOSIS — R768 Other specified abnormal immunological findings in serum: Secondary | ICD-10-CM

## 2014-05-30 DIAGNOSIS — J418 Mixed simple and mucopurulent chronic bronchitis: Secondary | ICD-10-CM

## 2014-05-30 DIAGNOSIS — R894 Abnormal immunological findings in specimens from other organs, systems and tissues: Secondary | ICD-10-CM

## 2014-05-30 DIAGNOSIS — B182 Chronic viral hepatitis C: Secondary | ICD-10-CM | POA: Insufficient documentation

## 2014-05-30 DIAGNOSIS — J441 Chronic obstructive pulmonary disease with (acute) exacerbation: Secondary | ICD-10-CM

## 2014-05-30 LAB — RPR

## 2014-05-30 LAB — CBC WITH DIFFERENTIAL/PLATELET
BASOS PCT: 1 % (ref 0–1)
Basophils Absolute: 0.1 10*3/uL (ref 0.0–0.1)
EOS ABS: 0.2 10*3/uL (ref 0.0–0.7)
Eosinophils Relative: 2 % (ref 0–5)
HEMATOCRIT: 45 % (ref 36.0–46.0)
Hemoglobin: 15.1 g/dL — ABNORMAL HIGH (ref 12.0–15.0)
Lymphocytes Relative: 33 % (ref 12–46)
Lymphs Abs: 3.6 10*3/uL (ref 0.7–4.0)
MCH: 31.6 pg (ref 26.0–34.0)
MCHC: 33.6 g/dL (ref 30.0–36.0)
MCV: 94.1 fL (ref 78.0–100.0)
MONO ABS: 0.7 10*3/uL (ref 0.1–1.0)
MONOS PCT: 6 % (ref 3–12)
NEUTROS PCT: 58 % (ref 43–77)
Neutro Abs: 6.4 10*3/uL (ref 1.7–7.7)
Platelets: 289 10*3/uL (ref 150–400)
RBC: 4.78 MIL/uL (ref 3.87–5.11)
RDW: 14.2 % (ref 11.5–15.5)
WBC: 11 10*3/uL — ABNORMAL HIGH (ref 4.0–10.5)

## 2014-05-30 LAB — COMPREHENSIVE METABOLIC PANEL
ALBUMIN: 3.3 g/dL — AB (ref 3.5–5.2)
ALT: 27 U/L (ref 0–35)
AST: 25 U/L (ref 0–37)
Alkaline Phosphatase: 86 U/L (ref 39–117)
BUN: 7 mg/dL (ref 6–23)
CALCIUM: 8.7 mg/dL (ref 8.4–10.5)
CO2: 25 meq/L (ref 19–32)
CREATININE: 0.84 mg/dL (ref 0.50–1.10)
Chloride: 108 mEq/L (ref 96–112)
Glucose, Bld: 78 mg/dL (ref 70–99)
Potassium: 4.4 mEq/L (ref 3.5–5.3)
SODIUM: 138 meq/L (ref 135–145)
TOTAL PROTEIN: 5.6 g/dL — AB (ref 6.0–8.3)
Total Bilirubin: 0.3 mg/dL (ref 0.2–1.2)

## 2014-05-30 LAB — HEPATITIS PANEL, ACUTE
HCV Ab: REACTIVE — AB
Hep A IgM: NONREACTIVE
Hep B C IgM: NONREACTIVE
Hepatitis B Surface Ag: NEGATIVE

## 2014-05-30 LAB — HIV ANTIBODY (ROUTINE TESTING W REFLEX): HIV: NONREACTIVE

## 2014-05-30 MED ORDER — PREDNISONE 20 MG PO TABS: 40.0000 mg | ORAL_TABLET | Freq: Every day | ORAL | Status: DC

## 2014-05-30 MED ORDER — AZITHROMYCIN 250 MG PO TABS: ORAL_TABLET | ORAL | Status: DC

## 2014-05-30 MED ORDER — PROMETHAZINE-CODEINE 6.25-10 MG/5ML PO SYRP: 10.0000 mL | ORAL_SOLUTION | Freq: Three times a day (TID) | ORAL | Status: DC | PRN

## 2014-05-30 MED ORDER — ALBUTEROL SULFATE (2.5 MG/3ML) 0.083% IN NEBU: 2.5000 mg | INHALATION_SOLUTION | Freq: Four times a day (QID) | RESPIRATORY_TRACT | Status: DC | PRN

## 2014-05-30 NOTE — Assessment & Plan Note (Signed)
Referring acute exacerbation she is given azithromycin as well as prednisone 40 mg 5 days she will continue with her inhaler Cough medicine with codeine as needed

## 2014-05-30 NOTE — Patient Instructions (Signed)
Take cough medicine Steroids and antibiotics We will call with labs Use nebulizer  F/U 3 months

## 2014-05-30 NOTE — Assessment & Plan Note (Signed)
She is very upset regarding the hepatitis C I will recheck hepatitis C as well as hepatitis B and I will do a court level from hepatitis C HIV test and RPR also ordered Tried to reassure her that even if she has hepatitis C they're very good treatments available to cure this disease

## 2014-05-30 NOTE — Progress Notes (Signed)
Patient ID: Kara Kelly, female   DOB: 1966/07/12, 48 y.o.   MRN: [id number]   Subjective:    Patient ID: Kara Kelly, female    DOB: 1966/07/12, 48 y.o.   MRN: [id number]  Patient presents for concern for Hep C and COPD  patient here with cough with production wheezing for the past 4 days she's been using over-the-counter medicines as well as her albuterol with minimal improvement. She's not had any significant fever.  She's been donating plasma for the past 2 weeks they did screening prior their guidelines and her hepatitis C came back positive. She does have a history of intravenous drug use about 15 years ago she denies multiple sexual partners denies any new tattoos or piercings no blood transfusions    Review Of Systems:  GEN- denies fatigue, fever, weight loss,weakness, recent illness HEENT- denies eye drainage, change in vision, nasal discharge, CVS- denies chest pain, palpitations RESP- denies SOB, cough, wheeze ABD- denies N/V, change in stools, abd pain GU- denies dysuria, hematuria, dribbling, incontinence MSK- denies joint pain, muscle aches, injury Neuro- denies headache, dizziness, syncope, seizure activity       Objective:    BP 120/86  Pulse 96  Temp(Src) 98.1 F (36.7 C) (Oral)  Resp 20  Wt 190 lb (86.183 kg) GEN- NAD, alert and oriented x3 HEENT- PERRL, EOMI, non injected sclera, pink conjunctiva, MMM, oropharynx clear Neck- Supple, no thyromegaly CVS- RRR, no murmur RESP-bilat wheeze, bronchospasm, mild rhonchi bilat, good air movement, no retractions ABD-NABS,soft,NT,ND, no Hepatomegaly EXT- No edema Pulses- Radial 2+        Assessment & Plan:      Problem List Items Addressed This Visit   Hepatitis C antibody test positive     She is very upset regarding the hepatitis C I will recheck hepatitis C as well as hepatitis B and I will do a court level from hepatitis C HIV test and RPR also ordered Tried to reassure her that  even if she has hepatitis C they're very good treatments available to cure this disease    Relevant Orders      Hepatitis C RNA quantitative      CBC with Differential      Comprehensive metabolic panel      Hepatitis panel, acute   COPD exacerbation     Referring acute exacerbation she is given azithromycin as well as prednisone 40 mg 5 days she will continue with her inhaler Cough medicine with codeine as needed    Relevant Medications      tiotropium (SPIRIVA) 18 MCG inhalation capsule      predniSONE (DELTASONE) tablet      azithromycin (ZITHROMAX) tablet      promethazine-codeine (PHENERGAN WITH CODEINE) 6.25-10 MG/5ML syrup      albuterol (PROVENTIL) (2.5 MG/3ML) 0.083% nebulizer solution   COPD (chronic obstructive pulmonary disease) - Primary   Relevant Medications      tiotropium (SPIRIVA) 18 MCG inhalation capsule      predniSONE (DELTASONE) tablet      azithromycin (ZITHROMAX) tablet      promethazine-codeine (PHENERGAN WITH CODEINE) 6.25-10 MG/5ML syrup      albuterol (PROVENTIL) (2.5 MG/3ML) 0.083% nebulizer solution    Other Visit Diagnoses   Screen for STD (sexually transmitted disease)        Relevant Orders       HIV antibody       RPR       Note: This dictation was prepared with Reubin Milanragon  dictation along with smaller phrase technology. Any transcriptional errors that result from this process are unintentional.

## 2014-06-02 LAB — HEPATITIS C RNA QUANTITATIVE
HCV QUANT LOG: 5.62 {Log} — AB (ref <1.18)
HCV QUANT: 420559 [IU]/mL — AB (ref <15)

## 2014-06-02 NOTE — Addendum Note (Signed)
Addended by: Milinda AntisURHAM, KAWANTA F on: 06/02/2014 09:34 PM   Modules accepted: Orders

## 2014-06-03 ENCOUNTER — Other Ambulatory Visit: Payer: Self-pay | Admitting: *Deleted

## 2014-06-17 ENCOUNTER — Other Ambulatory Visit (INDEPENDENT_AMBULATORY_CARE_PROVIDER_SITE_OTHER)

## 2014-06-17 DIAGNOSIS — B182 Chronic viral hepatitis C: Secondary | ICD-10-CM

## 2014-06-18 LAB — PROTIME-INR
INR: 0.93 (ref <1.50)
Prothrombin Time: 12.5 seconds (ref 11.6–15.2)

## 2014-06-18 LAB — IRON: Iron: 56 ug/dL (ref 42–145)

## 2014-06-18 LAB — ANA: ANA: NEGATIVE

## 2014-06-18 LAB — HEPATITIS B SURFACE ANTIBODY,QUALITATIVE: Hep B S Ab: NEGATIVE

## 2014-06-19 LAB — HEPATITIS C GENOTYPE

## 2014-07-08 ENCOUNTER — Encounter (HOSPITAL_COMMUNITY): Payer: Self-pay

## 2014-07-08 ENCOUNTER — Emergency Department (HOSPITAL_COMMUNITY)
Admission: EM | Admit: 2014-07-08 | Discharge: 2014-07-08 | Disposition: A | Discharge status: Home / Self Care | Attending: Emergency Medicine | Admitting: Emergency Medicine

## 2014-07-08 DIAGNOSIS — Z79899 Other long term (current) drug therapy: Secondary | ICD-10-CM | POA: Insufficient documentation

## 2014-07-08 DIAGNOSIS — G8929 Other chronic pain: Secondary | ICD-10-CM | POA: Insufficient documentation

## 2014-07-08 DIAGNOSIS — R51 Headache: Secondary | ICD-10-CM | POA: Diagnosis present

## 2014-07-08 DIAGNOSIS — F319 Bipolar disorder, unspecified: Secondary | ICD-10-CM | POA: Insufficient documentation

## 2014-07-08 DIAGNOSIS — Z8701 Personal history of pneumonia (recurrent): Secondary | ICD-10-CM | POA: Diagnosis not present

## 2014-07-08 DIAGNOSIS — G4489 Other headache syndrome: Secondary | ICD-10-CM | POA: Diagnosis not present

## 2014-07-08 DIAGNOSIS — F410 Panic disorder [episodic paroxysmal anxiety] without agoraphobia: Secondary | ICD-10-CM | POA: Insufficient documentation

## 2014-07-08 DIAGNOSIS — Z720 Tobacco use: Secondary | ICD-10-CM | POA: Diagnosis not present

## 2014-07-08 DIAGNOSIS — Z8611 Personal history of tuberculosis: Secondary | ICD-10-CM | POA: Diagnosis not present

## 2014-07-08 DIAGNOSIS — Z8742 Personal history of other diseases of the female genital tract: Secondary | ICD-10-CM | POA: Insufficient documentation

## 2014-07-08 DIAGNOSIS — J449 Chronic obstructive pulmonary disease, unspecified: Secondary | ICD-10-CM | POA: Insufficient documentation

## 2014-07-08 MED ORDER — DIPHENHYDRAMINE HCL 50 MG/ML IJ SOLN
25.0000 mg | Freq: Once | INTRAMUSCULAR | Status: AC
  Administered 2014-07-08: 25 mg via INTRAVENOUS
  Filled 2014-07-08: qty 1

## 2014-07-08 MED ORDER — KETOROLAC TROMETHAMINE 30 MG/ML IJ SOLN
30.0000 mg | Freq: Once | INTRAMUSCULAR | Status: AC
  Administered 2014-07-08: 30 mg via INTRAVENOUS
  Filled 2014-07-08: qty 1

## 2014-07-08 MED ORDER — HYDROCODONE-ACETAMINOPHEN 5-325 MG PO TABS
2.0000 | ORAL_TABLET | Freq: Once | ORAL | Status: AC
  Administered 2014-07-08: 2 via ORAL
  Filled 2014-07-08: qty 2

## 2014-07-08 MED ORDER — SODIUM CHLORIDE 0.9 % IV BOLUS (SEPSIS)
1000.0000 mL | Freq: Once | INTRAVENOUS | Status: AC
  Administered 2014-07-08: 1000 mL via INTRAVENOUS

## 2014-07-08 MED ORDER — PROCHLORPERAZINE MALEATE 10 MG PO TABS: 10.0000 mg | ORAL_TABLET | Freq: Two times a day (BID) | ORAL | Status: DC | PRN

## 2014-07-08 MED ORDER — NAPROXEN 500 MG PO TABS
500.0000 mg | ORAL_TABLET | Freq: Two times a day (BID) | ORAL | Status: DC
Start: 2014-07-08 — End: 2015-09-10

## 2014-07-08 MED ORDER — PROCHLORPERAZINE EDISYLATE 5 MG/ML IJ SOLN
10.0000 mg | Freq: Once | INTRAMUSCULAR | Status: AC
  Administered 2014-07-08: 10 mg via INTRAVENOUS
  Filled 2014-07-08: qty 2

## 2014-07-08 NOTE — ED Notes (Signed)
MD at bedside. 

## 2014-07-08 NOTE — ED Notes (Signed)
PT c/o migraine headache with noise/light sensitivity, n/v x2 days with hx of recurrent migraines.

## 2014-07-08 NOTE — ED Notes (Signed)
PT requesting d/c papers at this time. ER MD made aware.

## 2014-07-08 NOTE — ED Notes (Signed)
Pt c/o migraine with n/v x 2 days.  Reports history of migraines.

## 2014-07-08 NOTE — ED Provider Notes (Signed)
CSN: [id number]     Arrival date & time 07/08/14  1437 History   First MD Initiated Contact with Patient 07/08/14 1452     Chief Complaint  Patient presents with  . Headache     (Consider location/radiation/quality/duration/timing/severity/associated sxs/prior Treatment) HPI Comments: The patient is a 48 year old female, history of headaches which she reports are approximately once or twice a month, presents with a headache that started yesterday, gradual in onset, persistent, fluctuates in intensity, located on the top of the head with radiation to the posterior head, feels like sharp and stabbing pains and is associated with nausea and recurrent episodes of vomiting. She has no changes in her ability to walk, her coordination, her speech, her strength or sensation of the arms or legs. She does have some mild blurry vision that happens with her headaches and this is similar today. She has been prescribed Imitrex in the past, she states that this does not work, she denies taking any other medications prior to arrival. She does have associated photophobia.  Patient is a 48 y.o. female presenting with headaches. The history is provided by the patient.  Headache   Past Medical History  Diagnosis Date  . Panic attack   . Depression   . Endometriosis   . Bipolar 1 disorder   . Headache(784.0)     daily  . Personality disorder   . Tuberculosis     been tested positive for it  . COPD (chronic obstructive pulmonary disease)   . Pneumonia complicating pregnancy   . Pneumonia     chronic per lung doctor  . Chronic neck and back pain   . Migraine    Past Surgical History  Procedure Laterality Date  . Appendectomy    . Cholecystectomy    . Abdominal hysterectomy    . Neck surgery    . Cholecystectomy     Family History  Problem Relation Age of Onset  . Kidney cancer Father   . Heart disease Mother    History  Substance Use Topics  . Smoking status: Current Every Day Smoker --  1.00 packs/day    Types: Cigarettes  . Smokeless tobacco: Never Used  . Alcohol Use: No   OB History    No data available     Review of Systems  Neurological: Positive for headaches.  All other systems reviewed and are negative.     Allergies  Flexeril and Morphine and related  Home Medications   Prior to Admission medications   Medication Sig Start Date End Date Taking? Authorizing Provider  albuterol (PROVENTIL HFA;VENTOLIN HFA) 108 (90 BASE) MCG/ACT inhaler Inhale 2 puffs into the lungs every 6 (six) hours as needed for wheezing or shortness of breath.   Yes Historical Provider, MD  albuterol (PROVENTIL) (2.5 MG/3ML) 0.083% nebulizer solution Take 3 mLs (2.5 mg total) by nebulization every 6 (six) hours as needed for wheezing or shortness of breath. 05/30/14  Yes Salley ScarletKawanta F Stanley, MD  ALPRAZolam Prudy Feeler(XANAX) 1 MG tablet Take 1 mg by mouth 4 (four) times daily.   Yes Historical Provider, MD  Ascorbic Acid (VITAMIN C PO) Take 1 tablet by mouth daily.   Yes Historical Provider, MD  asenapine (SAPHRIS) 5 MG SUBL 24 hr tablet Place 5 mg under the tongue at bedtime.   Yes Historical Provider, MD  CINNAMON PO Take 1 capsule by mouth daily.   Yes Historical Provider, MD  cloNIDine (CATAPRES) 0.1 MG tablet Take 0.1 mg by mouth 2 (two) times daily.  Yes Historical Provider, MD  Cyanocobalamin (VITAMIN B 12 PO) Take 1 tablet by mouth daily.   Yes Historical Provider, MD  prazosin (MINIPRESS) 2 MG capsule Take 2 mg by mouth at bedtime.   Yes Historical Provider, MD  Pyridoxine HCl (VITAMIN B-6 PO) Take 1 tablet by mouth daily.   Yes Historical Provider, MD  sertraline (ZOLOFT) 100 MG tablet Take 100 mg by mouth daily.   Yes Historical Provider, MD  tiotropium (SPIRIVA) 18 MCG inhalation capsule Place 18 mcg into inhaler and inhale daily.   Yes Historical Provider, MD  azithromycin (ZITHROMAX) 250 MG tablet Take 2 tablets x 1 day, then 1 tab daily for 4 days Patient not taking: Reported on  07/08/2014 05/30/14   Salley Scarlet, MD  naproxen (NAPROSYN) 500 MG tablet Take 1 tablet (500 mg total) by mouth 2 (two) times daily with a meal. 07/08/14   Vida Roller, MD  predniSONE (DELTASONE) 20 MG tablet Take 2 tablets (40 mg total) by mouth daily with breakfast. Patient not taking: Reported on 07/08/2014 05/30/14   Salley Scarlet, MD  prochlorperazine (COMPAZINE) 10 MG tablet Take 1 tablet (10 mg total) by mouth 2 (two) times daily as needed for nausea or vomiting (Nausea ). 07/08/14   Vida Roller, MD  promethazine-codeine (PHENERGAN WITH CODEINE) 6.25-10 MG/5ML syrup Take 10 mLs by mouth every 8 (eight) hours as needed for cough. Patient not taking: Reported on 07/08/2014 05/30/14   Salley Scarlet, MD   BP 100/70 mmHg  Pulse 70  Temp(Src) 97.6 F (36.4 C) (Oral)  Resp 18  Ht 5\' 10"  (1.778 m)  Wt 188 lb (85.276 kg)  BMI 26.98 kg/m2  SpO2 98% Physical Exam  Constitutional: She appears well-developed and well-nourished. No distress.  HENT:  Head: Normocephalic and atraumatic.  Mouth/Throat: Oropharynx is clear and moist. No oropharyngeal exudate.  Oropharynx clear and moist  Eyes: Conjunctivae and EOM are normal. Pupils are equal, round, and reactive to light. Right eye exhibits no discharge. Left eye exhibits no discharge. No scleral icterus.  Neck: Normal range of motion. Neck supple. No JVD present. No thyromegaly present.  Cardiovascular: Normal rate, regular rhythm, normal heart sounds and intact distal pulses.  Exam reveals no gallop and no friction rub.   No murmur heard. Pulmonary/Chest: Effort normal and breath sounds normal. No respiratory distress. She has no wheezes. She has no rales.  Abdominal: Soft. Bowel sounds are normal. She exhibits no distension and no mass. There is no tenderness.  Musculoskeletal: Normal range of motion. She exhibits no edema or tenderness.  Lymphadenopathy:    She has no cervical adenopathy.  Neurological: She is alert. Coordination  normal.  Speech is clear, cranial nerves III through XII are intact, memory is intact, strength is normal in all 4 extremities including grips, sensation is intact to light touch and pinprick in all 4 extremities. Coordination as tested by finger-nose-finger is normal, no limb ataxia. Normal gait, normal reflexes at the patellar tendons bilaterally  Skin: Skin is warm and dry. No rash noted. No erythema.  Psychiatric: She has a normal mood and affect. Her behavior is normal.  Nursing note and vitals reviewed.   ED Course  Procedures (including critical care time) Labs Review Labs Reviewed - No data to display  Imaging Review No results found.    MDM   Final diagnoses:  Other headache syndrome    Vitals unremarkable, ongoing headache, likely migrainous, low risk for pathologic sources, start with headache  cocktail, fluids, reevaluate. Patient is in agreement with the plan.  Improved somewhat - pt has removed IV and asked for d/c - agreeable to 2 vicodin ptd for improved pain relief.  Meds given in ED:  Medications  HYDROcodone-acetaminophen (NORCO/VICODIN) 5-325 MG per tablet 2 tablet (not administered)  prochlorperazine (COMPAZINE) injection 10 mg (10 mg Intravenous Given 07/08/14 1521)  ketorolac (TORADOL) 30 MG/ML injection 30 mg (30 mg Intravenous Given 07/08/14 1521)  sodium chloride 0.9 % bolus 1,000 mL (0 mLs Intravenous Stopped 07/08/14 1620)  diphenhydrAMINE (BENADRYL) injection 25 mg (25 mg Intravenous Given 07/08/14 1521)    New Prescriptions   NAPROXEN (NAPROSYN) 500 MG TABLET    Take 1 tablet (500 mg total) by mouth 2 (two) times daily with a meal.   PROCHLORPERAZINE (COMPAZINE) 10 MG TABLET    Take 1 tablet (10 mg total) by mouth 2 (two) times daily as needed for nausea or vomiting (Nausea ).      Vida RollerBrian D Abella Shugart, MD 07/08/14 [phone]

## 2014-07-08 NOTE — ED Notes (Signed)
PT states she still has a headache. ER MD made aware.

## 2014-07-08 NOTE — Discharge Instructions (Signed)
Please call your doctor for a followup appointment within 24-48 hours. When you talk to your doctor please let them know that you were seen in the emergency department and have them acquire all of your records so that they can discuss the findings with you and formulate a treatment plan to fully care for your new and ongoing problems. ° °

## 2014-07-10 ENCOUNTER — Encounter: Payer: Self-pay | Admitting: Internal Medicine

## 2014-07-10 ENCOUNTER — Ambulatory Visit (INDEPENDENT_AMBULATORY_CARE_PROVIDER_SITE_OTHER): Admitting: *Deleted

## 2014-07-10 ENCOUNTER — Ambulatory Visit (INDEPENDENT_AMBULATORY_CARE_PROVIDER_SITE_OTHER): Admitting: Internal Medicine

## 2014-07-10 VITALS — Temp 97.7°F | Ht 70.0 in | Wt 187.0 lb

## 2014-07-10 DIAGNOSIS — B182 Chronic viral hepatitis C: Secondary | ICD-10-CM

## 2014-07-10 DIAGNOSIS — Z23 Encounter for immunization: Secondary | ICD-10-CM

## 2014-07-10 NOTE — Progress Notes (Signed)
+Kara Kelly is a 48 y.o. female who presents for initial evaluation and management of a positive Hepatitis C antibody test.  Patient tested positive recently with plasma donation. Hepatitis C risk factors present are: drug abuse (details: Last used over 15 years ago, never IV, only snorting). Patient denies history of blood transfusion, multiple sexual partners, sexual contact with person with liver disease, tattoos. Patient has had other studies performed. Results: hepatitis C RNA by PCR, result: positive. Patient has not had prior treatment for Hepatitis C. Patient does not have a past history of liver disease. Patient does not have a family history of liver disease.   HPI: She was recently diagnosed. Last drug use over 10 years ago.  Patient does not have documented immunity to Hepatitis A. Patient does not have documented immunity to Hepatitis B.     Review of Systems A comprehensive review of systems was negative.   Past Medical History  Diagnosis Date  . Panic attack   . Depression   . Endometriosis   . Bipolar 1 disorder   . Headache(784.0)     daily  . Personality disorder   . Tuberculosis     been tested positive for it  . COPD (chronic obstructive pulmonary disease)   . Pneumonia complicating pregnancy   . Pneumonia     chronic per lung doctor  . Chronic neck and back pain   . Migraine     Prior to Admission medications   Medication Sig Start Date End Date Taking? Authorizing Provider  albuterol (PROVENTIL HFA;VENTOLIN HFA) 108 (90 BASE) MCG/ACT inhaler Inhale 2 puffs into the lungs every 6 (six) hours as needed for wheezing or shortness of breath.   Yes Historical Provider, MD  albuterol (PROVENTIL) (2.5 MG/3ML) 0.083% nebulizer solution Take 3 mLs (2.5 mg total) by nebulization every 6 (six) hours as needed for wheezing or shortness of breath. 05/30/14  Yes Salley ScarletKawanta F [place], MD  ALPRAZolam Prudy Feeler(XANAX) 1 MG tablet Take 1 mg by mouth 4 (four) times daily.   Yes  Historical Provider, MD  Ascorbic Acid (VITAMIN C PO) Take 1 tablet by mouth daily.   Yes Historical Provider, MD  asenapine (SAPHRIS) 5 MG SUBL 24 hr tablet Place 5 mg under the tongue at bedtime.   Yes Historical Provider, MD  CINNAMON PO Take 1 capsule by mouth daily.   Yes Historical Provider, MD  cloNIDine (CATAPRES) 0.1 MG tablet Take 0.1 mg by mouth 2 (two) times daily.    Historical Provider, MD  Cyanocobalamin (VITAMIN B 12 PO) Take 1 tablet by mouth daily.    Historical Provider, MD  naproxen (NAPROSYN) 500 MG tablet Take 1 tablet (500 mg total) by mouth 2 (two) times daily with a meal. 07/08/14   Vida RollerBrian D Miller, MD  prazosin (MINIPRESS) 2 MG capsule Take 2 mg by mouth at bedtime.    Historical Provider, MD  predniSONE (DELTASONE) 20 MG tablet Take 2 tablets (40 mg total) by mouth daily with breakfast. Patient not taking: Reported on 07/08/2014 05/30/14   Salley ScarletKawanta F [place], MD  prochlorperazine (COMPAZINE) 10 MG tablet Take 1 tablet (10 mg total) by mouth 2 (two) times daily as needed for nausea or vomiting (Nausea ). 07/08/14   Vida RollerBrian D Miller, MD  promethazine-codeine (PHENERGAN WITH CODEINE) 6.25-10 MG/5ML syrup Take 10 mLs by mouth every 8 (eight) hours as needed for cough. Patient not taking: Reported on 07/08/2014 05/30/14   Salley ScarletKawanta F [place], MD  Pyridoxine HCl (VITAMIN B-6 PO) Take  1 tablet by mouth daily.    Historical Provider, MD  sertraline (ZOLOFT) 100 MG tablet Take 100 mg by mouth daily.    Historical Provider, MD  tiotropium (SPIRIVA) 18 MCG inhalation capsule Place 18 mcg into inhaler and inhale daily.    Historical Provider, MD    Allergies  Allergen Reactions  . Flexeril [Cyclobenzaprine] Other (See Comments)    Headaches. Doesn't like the way it makes her feel.   . Morphine And Related Other (See Comments)    Alters mental status: Makes violent    History  Substance Use Topics  . Smoking status: Current Every Day Smoker -- 1.00 packs/day    Types: Cigarettes  .  Smokeless tobacco: Never Used  . Alcohol Use: No    Family History  Problem Relation Age of Onset  . Kidney cancer Father   . Heart disease Mother       Objective:   Ceasar MonsFiled Vitals:   07/10/14 0857  Temp: 97.7 F (36.5 C)   in no apparent distress and alert HEENT: anicteric Cor RRR and No murmurs clear Bowel sounds are normal, liver is not enlarged, spleen is not enlarged peripheral pulses normal, no pedal edema, no clubbing or cyanosis negative for - jaundice, spider hemangioma, telangiectasia, palmar erythema, ecchymosis and atrophy  Laboratory Genotype:  Lab Results  Component Value Date   HCVGENOTYPE 1a 06/17/2014   HCV viral load:  Lab Results  Component Value Date   HCVQUANT [id number]* 05/30/2014   Lab Results  Component Value Date   WBC 11.0* 05/30/2014   HGB 15.1* 05/30/2014   HCT 45.0 05/30/2014   MCV 94.1 05/30/2014   PLT 289 05/30/2014    Lab Results  Component Value Date   CREATININE 0.84 05/30/2014   BUN 7 05/30/2014   NA 138 05/30/2014   K 4.4 05/30/2014   CL 108 05/30/2014   CO2 25 05/30/2014    Lab Results  Component Value Date   ALT 27 05/30/2014   AST 25 05/30/2014   ALKPHOS 86 05/30/2014   BILITOT 0.3 05/30/2014   INR 0.93 06/17/2014      Assessment: Hepatitis C genotype 1a  Plan: 1) Patient counseled extensively on limiting acetaminophen to no more than 2 grams daily, avoidance of alcohol. 2) Transmission discussed with patient including sexual transmission, sharing razors and toothbrush.   3) Will need referral to gastroenterology if concern for cirrhosis 4) Will need referral for substance abuse counseling: No. 5) Will prescribe Harvoni for 12 weeks once work up complete 6) Hepatitis A vaccine Yes.   7) Hepatitis B vaccine Yes.   8) Pneumovax vaccine if concern for cirrhosis 9) will follow up after elastography

## 2014-07-10 NOTE — Addendum Note (Signed)
Addended by: Wendall MolaOCKERHAM, JACQUELINE A on: 07/10/2014 11:20 AM   Modules accepted: Orders

## 2014-07-10 NOTE — Patient Instructions (Signed)
Date 07/10/14  Dear Ms Jacqualyn PoseySartin Linnabery, As discussed in the ID Clinic, your hepatitis C therapy will include the following medications:          Harvoni 90mg /400mg  tablet:           Take 1 tablet by mouth once daily   Please note that ALL MEDICATIONS WILL START ON THE SAME DATE for a total of 12 weeks. ---------------------------------------------------------------- Your HCV Treatment Start Date: TBA   Your HCV genotype:  1a    Liver Fibrosis: TBD    ---------------------------------------------------------------- YOUR PHARMACY CONTACT:   [facility] [address] Phone: [phone] Hours: Monday to Friday 7:30 am to 6:00 pm   Please always contact your pharmacy at least 3-4 business days before you run out of medications to ensure your next month's medication is ready or 1 week prior to running out if you receive it by mail.  Remember, each prescription is for 28 days. ---------------------------------------------------------------- GENERAL NOTES REGARDING YOUR HEPATITIS C MEDICATION:  SOFOSBUVIR/LEDIPASVIR (HARVONI): - Harvoni tablet is taken daily with OR without food. - The tablets are orange. - The tablets should be stored at room temperature.  - Acid reducing agents such as H2 blockers (ie. Pepcid (famotidine), Zantac (ranitidine), Tagamet (cimetidine), Axid (nizatidine) and proton pump inhibitors (ie. Prilosec (omeprazole), Protonix (pantoprazole), Nexium (esomeprazole), or Aciphex (rabeprazole)) can decrease effectiveness of Harvoni. Do not take until you have discussed with a health care provider.    -Antacids that contain magnesium and/or aluminum hydroxide (ie. Milk of Magensia, Rolaids, Gaviscon, Maalox, Mylanta, an dArthritis Pain Formula)can reduce absorption of Harvoni, so take them at least 4 hours before or after Harvoni.  -Calcium carbonate (calcium supplements or antacids such as Tums,  Caltrate, Os-Cal)needs to be taken at least 4 hours hours before or after Harvoni.  -St. John's wort or any products that contain St. John's wort like some herbal supplements  Please inform the office prior to starting any of these medications.  - The common side effects with Harvoni:      1. Fatigue      2. Headache      3. Nausea      4. Diarrhea      5. Insomnia   Support Path is a suite of resources designed to help patients start with HARVONI and move toward treatment completion GETTING STARTED Support Path helps patients access therapy and get off to an efficient start  Benefits investigation and prior authorization support Co-pay and other financial assistance A specialty pharmacy finder CO-PAY COUPON The HARVONI co-pay coupon may help eligible patients lower their out-of-pocket costs. With a co-pay coupon, most eligible patients may pay no more than $5 per co-pay (restrictions apply) www.harvoni.com call 419-620-24521-5413977428 Not valid for patients enrolled in government healthcare prescription drug programs, such as Medicare Part D and Medicaid. Patients in the coverage gap known as the "donut hole" also are not eligible The HARVONI co-pay coupon program will cover the out-of-pocket costs for HARVONI prescriptions up to a maximum of 25% of the catalog price of a 12-week regimen of HARVONI  Please note that this only lists the most common side effects and is NOT a comprehensive list of the potential side effects of these medications. For more information, please review the drug information sheets that come with your medication package from the pharmacy.  ---------------------------------------------------------------- GENERAL HELPFUL HINTS ON HCV THERAPY: 1. No alcohol. 2. Protect against sun-sensitivity/sunburns (wear sunglasses, hat, long sleeves,  pants and sunscreen). 3. Stay well-hydrated/well-moisturized. 4. Notify the [facility] Clinic of any changes in your other  over-the-counter/herbal or prescription medications. 5. If you miss a dose of your medication, take the missed dose as soon as you remember. Return to your regular time/dose schedule the next day.  6.  Do not stop taking your medications without first talking with your healthcare provider. 7.  You may take Tylenol (acetaminophen), as long as the dose is less than 2000 mg (OR no more than 4 tablets of the Tylenol Extra Strengths 500mg  tablet) in 24 hours. 8.  You will need to obtain routine labs and/or office visits at [facility] at weeks 2, 4, 8,  and 12 as well as 12 and 24 weeks after completion of treatment.   Scharlene Gloss, [place] for [place] [address] [place], [place]  [phone]

## 2014-07-14 ENCOUNTER — Ambulatory Visit (HOSPITAL_COMMUNITY)
Admission: RE | Admit: 2014-07-14 | Discharge: 2014-07-14 | Disposition: A | Discharge status: Home / Self Care | Source: Ambulatory Visit | Attending: Internal Medicine | Admitting: Internal Medicine

## 2014-07-14 ENCOUNTER — Other Ambulatory Visit: Payer: Self-pay | Admitting: Internal Medicine

## 2014-07-14 DIAGNOSIS — Z9049 Acquired absence of other specified parts of digestive tract: Secondary | ICD-10-CM | POA: Insufficient documentation

## 2014-07-14 DIAGNOSIS — B182 Chronic viral hepatitis C: Secondary | ICD-10-CM

## 2014-07-14 MED ORDER — LEDIPASVIR-SOFOSBUVIR 90-400 MG PO TABS: 1.0000 | ORAL_TABLET | Freq: Every day | ORAL | Status: DC

## 2014-07-15 ENCOUNTER — Other Ambulatory Visit: Payer: Self-pay | Admitting: *Deleted

## 2014-07-15 DIAGNOSIS — B182 Chronic viral hepatitis C: Secondary | ICD-10-CM

## 2014-07-16 ENCOUNTER — Telehealth: Payer: Self-pay | Admitting: *Deleted

## 2014-07-16 NOTE — Telephone Encounter (Signed)
Patient is starting Harvoni today; scheduled lab appointment for 08/14/14. She already had a scheduled appt on 08/06/14 to go over results of elastography with Dr. Luciana Axeomer and she wanted to keep this appointment. Kara Kelly

## 2014-07-29 ENCOUNTER — Ambulatory Visit (INDEPENDENT_AMBULATORY_CARE_PROVIDER_SITE_OTHER): Admitting: Family Medicine

## 2014-07-29 VITALS — BP 136/70 | HR 78 | Temp 98.0°F | Resp 16 | Ht 70.0 in | Wt 194.0 lb

## 2014-07-29 DIAGNOSIS — B353 Tinea pedis: Secondary | ICD-10-CM

## 2014-07-29 DIAGNOSIS — G43711 Chronic migraine without aura, intractable, with status migrainosus: Secondary | ICD-10-CM

## 2014-07-29 DIAGNOSIS — L0292 Furuncle, unspecified: Secondary | ICD-10-CM

## 2014-07-29 MED ORDER — HYDROCODONE-ACETAMINOPHEN 5-325 MG PO TABS: 1.0000 | ORAL_TABLET | Freq: Four times a day (QID) | ORAL | Status: DC | PRN

## 2014-07-29 MED ORDER — SULFAMETHOXAZOLE-TRIMETHOPRIM 800-160 MG PO TABS: 1.0000 | ORAL_TABLET | Freq: Two times a day (BID) | ORAL | Status: DC

## 2014-07-29 MED ORDER — ALBUTEROL SULFATE HFA 108 (90 BASE) MCG/ACT IN AERS: 2.0000 | INHALATION_SPRAY | Freq: Four times a day (QID) | RESPIRATORY_TRACT | Status: DC | PRN

## 2014-07-29 MED ORDER — TERBINAFINE HCL 1 % EX CREA: 1.0000 "application " | TOPICAL_CREAM | Freq: Two times a day (BID) | CUTANEOUS | Status: DC

## 2014-07-29 NOTE — Patient Instructions (Signed)
Take antibiotics Apply cream twice a day  Pain meds Warm soaks F/U 3 months

## 2014-07-30 NOTE — Assessment & Plan Note (Signed)
We'll give her topical terbinafine I do not one up with her on oral medication because of her hepatitis C current treatment I do not want to worsen any liver function She does seem to have some super infection with bacteria she is already placed on antibiotics because of the boils

## 2014-07-30 NOTE — Assessment & Plan Note (Signed)
Chronic problem for patient with headaches on and off multiple medications however because of her psychiatric medications will not make any additional changes. I did give her some pain medicine which will help from the abscesses as well
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
## 2014-07-30 NOTE — Assessment & Plan Note (Signed)
Status post incision and drainage and she continues to get them in the same spot and will recommend surgical removal

## 2014-08-06 ENCOUNTER — Ambulatory Visit (INDEPENDENT_AMBULATORY_CARE_PROVIDER_SITE_OTHER): Admitting: Internal Medicine

## 2014-08-06 ENCOUNTER — Encounter: Payer: Self-pay | Admitting: Internal Medicine

## 2014-08-06 VITALS — BP 113/77 | HR 90 | Temp 98.1°F | Wt 186.0 lb

## 2014-08-06 DIAGNOSIS — B182 Chronic viral hepatitis C: Secondary | ICD-10-CM

## 2014-08-06 DIAGNOSIS — G43019 Migraine without aura, intractable, without status migrainosus: Secondary | ICD-10-CM

## 2014-08-06 DIAGNOSIS — G43919 Migraine, unspecified, intractable, without status migrainosus: Secondary | ICD-10-CM | POA: Insufficient documentation

## 2014-08-06 DIAGNOSIS — G43009 Migraine without aura, not intractable, without status migrainosus: Secondary | ICD-10-CM

## 2014-08-06 MED ORDER — KETOROLAC TROMETHAMINE 30 MG/ML IJ SOLN
30.0000 mg | Freq: Once | INTRAMUSCULAR | Status: AC
  Administered 2014-08-06: 30 mg via INTRAVENOUS

## 2014-08-06 NOTE — Progress Notes (Signed)
   Subjective:    Patient ID: Trinna PostShelia Sartin Linnabery, female    DOB: 1966/02/07, 49 y.o.   MRN: [id number]  HPI Here for follow up of HCV.  Has chronic infection with genotype 1a, viral load 420,559.  F3/4 on elastography with high risk of fibrosis.  Never treated.  Started Harvoni 12/16.    Unfortunately, she has had severe headaches ever since starting Harvoni.  These have been daily.  No n/v. Is not worse in the am.  Constant.  Feels she is unable to continue.     Review of Systems  Constitutional: Negative for fatigue.  Gastrointestinal: Negative for abdominal pain and abdominal distention.  Skin: Negative for rash.  Neurological: Positive for headaches.       Severe headaches       Objective:   Physical Exam  Constitutional: She appears well-developed and well-nourished. No distress.  Eyes: No scleral icterus.  Cardiovascular: Normal rate, regular rhythm and normal heart sounds.   No murmur heard. Pulmonary/Chest: Effort normal and breath sounds normal. No respiratory distress.  Skin: No rash noted.          Assessment & Plan:

## 2014-08-06 NOTE — Assessment & Plan Note (Signed)
Gave a dose of toradol.  Told her to see her PCP or urgent care if it persists after 24-48 hours.

## 2014-08-06 NOTE — Assessment & Plan Note (Signed)
Unfortunately, unable to continue with Harvoni.  Will have her stop and return in 6 months when other new options are available.    Also could consider Delmar LandauViekira Pak but some interactions with norvir and her meds.

## 2014-08-07 ENCOUNTER — Encounter: Payer: Self-pay | Admitting: Physician Assistant

## 2014-08-07 ENCOUNTER — Ambulatory Visit (INDEPENDENT_AMBULATORY_CARE_PROVIDER_SITE_OTHER): Admitting: Physician Assistant

## 2014-08-07 VITALS — BP 126/88 | HR 84 | Temp 98.1°F | Resp 18 | Wt 187.0 lb

## 2014-08-07 DIAGNOSIS — R51 Headache: Secondary | ICD-10-CM

## 2014-08-07 DIAGNOSIS — R519 Headache, unspecified: Secondary | ICD-10-CM

## 2014-08-07 DIAGNOSIS — G43011 Migraine without aura, intractable, with status migrainosus: Secondary | ICD-10-CM

## 2014-08-07 MED ORDER — KETOROLAC TROMETHAMINE 60 MG/2ML IM SOLN
60.0000 mg | Freq: Once | INTRAMUSCULAR | Status: AC
Start: 2014-08-07 — End: 2014-08-07
  Administered 2014-08-07: 60 mg via INTRAMUSCULAR

## 2014-08-07 NOTE — Progress Notes (Signed)
Patient ID: Kara Kelly MRN: [id number], DOB: [birth year], 49 y.o. Date of Encounter: 08/07/2014, 1:45 PM    Chief Complaint:  Chief Complaint  Patient presents with  . constant recurrent headaches     HPI: 49 y.o. year old female presents with above complaint.  She also says that she saw Dr.Comer yesterday at [facility]---wants me to explain what he is doing. Says that she did not understand what he was trying to say yesterday. Says she is still having this same constant headache.      Home Meds:   Outpatient Prescriptions Prior to Visit  Medication Sig Dispense Refill  . albuterol (PROVENTIL HFA;VENTOLIN HFA) 108 (90 BASE) MCG/ACT inhaler Inhale 2 puffs into the lungs every 6 (six) hours as needed for wheezing or shortness of breath. 18 g 11  . albuterol (PROVENTIL) (2.5 MG/3ML) 0.083% nebulizer solution Take 3 mLs (2.5 mg total) by nebulization every 6 (six) hours as needed for wheezing or shortness of breath. 75 mL 3  . ALPRAZolam (XANAX) 1 MG tablet Take 1 mg by mouth 4 (four) times daily.    . Ascorbic Acid (VITAMIN C PO) Take 1 tablet by mouth daily.    Marland Kitchen. asenapine (SAPHRIS) 5 MG SUBL 24 hr tablet Place 5 mg under the tongue at bedtime.    Marland Kitchen. CINNAMON PO Take 1 capsule by mouth daily.    . cloNIDine (CATAPRES) 0.1 MG tablet Take 0.1 mg by mouth 2 (two) times daily.    . Cyanocobalamin (VITAMIN B 12 PO) Take 1 tablet by mouth daily.    . naproxen (NAPROSYN) 500 MG tablet Take 1 tablet (500 mg total) by mouth 2 (two) times daily with a meal. 30 tablet 0  . prazosin (MINIPRESS) 2 MG capsule Take 2 mg by mouth at bedtime.    . Pyridoxine HCl (VITAMIN B-6 PO) Take 1 tablet by mouth daily.    Marland Kitchen. terbinafine (LAMISIL AT) 1 % cream Apply 1 application topically 2 (two) times daily. For 2 weeks 42 g 1  . tiotropium (SPIRIVA) 18 MCG inhalation capsule Place 18 mcg into inhaler and inhale daily.    Marland Kitchen. sulfamethoxazole-trimethoprim (BACTRIM DS,SEPTRA DS) 800-160 MG per tablet Take 1  tablet by mouth 2 (two) times daily. 14 tablet 0   No facility-administered medications prior to visit.    Allergies:  Allergies  Allergen Reactions  . Flexeril [Cyclobenzaprine] Other (See Comments)    Headaches. Doesn't like the way it makes her feel.   . Morphine And Related Other (See Comments)    Alters mental status: Makes violent      Review of Systems: See HPI for pertinent ROS. All other ROS negative.    Physical Exam: Blood pressure 126/88, pulse 84, temperature 98.1 F (36.7 C), temperature source Oral, resp. rate 18, weight 187 lb (84.823 kg)., Body mass index is 26.83 kg/(m^2). General:  Appears in no acute distress. Neck: Supple. No thyromegaly. No lymphadenopathy. Lungs: Clear bilaterally to auscultation without wheezes, rales, or rhonchi. Breathing is unlabored. Heart: Regular rhythm. No murmurs, rubs, or gallops. Msk:  Strength and tone normal for age. Extremities/Skin: Warm and dry. Neuro: Alert and oriented X 3. Moves all extremities spontaneously. Gait is normal. CNII-XII grossly in tact. Psych:  Responds to questions appropriately with a normal affect.     ASSESSMENT AND PLAN:  49 y.o. year old female with  1. Headache, unspecified headache type - ketorolac (TORADOL) injection 60 mg; Inject 2 mLs (60 mg total) into the muscle  once.  2. Intractable migraine without aura and with status migrainosus  I reviewed her chart and see that she has had multiple visits with multiple different medical providers regarding headache recently. Also reviewed her note by Dr. Luciana Axe at [facility] disease from yesterday.   Discussed that he was stopping the medicine called Harvoni because ever since he started that,  she has been complaining of headache and that he is stopping this medication in case this is the cause of the headache.  She is also complaining that she still is having headache and cannot get rid of it. I asked what his helped in the past that she provides no  answer. I reviewed her ER notes and other records. Yesterday Dr. Luciana Axe at [facility] gave her Toradol 30 mg and she said this provided no relief. I will give her Toradol 60 mg IM now see if this will provide some relief. She does have someone with her who can drive her.   Signed, [address] [place], [place], [facility] 08/07/2014 1:45 PM

## 2014-08-14 ENCOUNTER — Other Ambulatory Visit

## 2014-08-20 ENCOUNTER — Ambulatory Visit: Admitting: Internal Medicine

## 2014-09-23 ENCOUNTER — Encounter: Payer: Self-pay | Admitting: Family Medicine

## 2014-10-21 ENCOUNTER — Encounter: Payer: Self-pay | Admitting: Family Medicine

## 2014-10-28 ENCOUNTER — Encounter: Payer: Self-pay | Admitting: Family Medicine

## 2014-10-28 ENCOUNTER — Ambulatory Visit: Admitting: Family Medicine

## 2014-11-24 ENCOUNTER — Encounter: Payer: Self-pay | Admitting: Family Medicine

## 2015-03-26 ENCOUNTER — Telehealth: Payer: Self-pay | Admitting: *Deleted

## 2015-03-26 NOTE — Telephone Encounter (Signed)
Patient has relocated to [facility], left message asking for a referral to a provider there for Hepatitis C. RN returned call, let her know that we cannot  Make that referral, as we are not her primary care provider.  We are more than happy to fax her notes once she has established care. Andree Coss, RN

## 2015-03-27 NOTE — Telephone Encounter (Signed)
Please call pt, see if she has moved to [facility], see if she has a nEW PCP or if she plans to continue driving back to our office. I would recommend that she get a local PCP to help with her medications and referrals.

## 2015-03-27 NOTE — Telephone Encounter (Signed)
Call placed to patient. LMTRC.  

## 2015-03-30 NOTE — Telephone Encounter (Signed)
Call placed to patient. LMTRC.  

## 2015-03-31 NOTE — Telephone Encounter (Signed)
Multiple calls to patient with no answer and no return call.   Message to be closed.

## 2015-04-07 ENCOUNTER — Telehealth: Payer: Self-pay | Admitting: *Deleted

## 2015-04-07 NOTE — Telephone Encounter (Signed)
Received signed release of information.  Faxed [facility]'s office notes to [facility] and Internal Medicine as requested.   Andree Coss, RN

## 2015-07-21 ENCOUNTER — Other Ambulatory Visit (HOSPITAL_COMMUNITY): Payer: Self-pay | Admitting: Family Medicine

## 2015-07-21 DIAGNOSIS — Z1231 Encounter for screening mammogram for malignant neoplasm of breast: Secondary | ICD-10-CM

## 2015-07-23 ENCOUNTER — Ambulatory Visit (HOSPITAL_COMMUNITY)

## 2015-09-10 ENCOUNTER — Encounter (HOSPITAL_COMMUNITY): Payer: Self-pay | Admitting: *Deleted

## 2015-09-10 ENCOUNTER — Emergency Department (HOSPITAL_COMMUNITY)
Admission: EM | Admit: 2015-09-10 | Discharge: 2015-09-11 | Disposition: A | Discharge status: Other Acute Inpatient Hospital | Attending: Emergency Medicine | Admitting: Emergency Medicine

## 2015-09-10 ENCOUNTER — Emergency Department (HOSPITAL_COMMUNITY)

## 2015-09-10 DIAGNOSIS — Z8742 Personal history of other diseases of the female genital tract: Secondary | ICD-10-CM | POA: Diagnosis not present

## 2015-09-10 DIAGNOSIS — Y9389 Activity, other specified: Secondary | ICD-10-CM | POA: Diagnosis not present

## 2015-09-10 DIAGNOSIS — R51 Headache: Secondary | ICD-10-CM | POA: Insufficient documentation

## 2015-09-10 DIAGNOSIS — F17210 Nicotine dependence, cigarettes, uncomplicated: Secondary | ICD-10-CM | POA: Diagnosis not present

## 2015-09-10 DIAGNOSIS — F329 Major depressive disorder, single episode, unspecified: Secondary | ICD-10-CM | POA: Insufficient documentation

## 2015-09-10 DIAGNOSIS — F1310 Sedative, hypnotic or anxiolytic abuse, uncomplicated: Secondary | ICD-10-CM | POA: Diagnosis not present

## 2015-09-10 DIAGNOSIS — Y998 Other external cause status: Secondary | ICD-10-CM | POA: Insufficient documentation

## 2015-09-10 DIAGNOSIS — X778XXA Intentional self-harm by other hot objects, initial encounter: Secondary | ICD-10-CM | POA: Insufficient documentation

## 2015-09-10 DIAGNOSIS — F410 Panic disorder [episodic paroxysmal anxiety] without agoraphobia: Secondary | ICD-10-CM | POA: Diagnosis not present

## 2015-09-10 DIAGNOSIS — Z8611 Personal history of tuberculosis: Secondary | ICD-10-CM | POA: Diagnosis not present

## 2015-09-10 DIAGNOSIS — F32A Depression, unspecified: Secondary | ICD-10-CM

## 2015-09-10 DIAGNOSIS — Z8701 Personal history of pneumonia (recurrent): Secondary | ICD-10-CM | POA: Diagnosis not present

## 2015-09-10 DIAGNOSIS — R45851 Suicidal ideations: Secondary | ICD-10-CM | POA: Diagnosis not present

## 2015-09-10 DIAGNOSIS — Z79899 Other long term (current) drug therapy: Secondary | ICD-10-CM | POA: Diagnosis not present

## 2015-09-10 DIAGNOSIS — F1210 Cannabis abuse, uncomplicated: Secondary | ICD-10-CM | POA: Insufficient documentation

## 2015-09-10 DIAGNOSIS — Y9289 Other specified places as the place of occurrence of the external cause: Secondary | ICD-10-CM | POA: Insufficient documentation

## 2015-09-10 DIAGNOSIS — Z008 Encounter for other general examination: Secondary | ICD-10-CM | POA: Diagnosis present

## 2015-09-10 DIAGNOSIS — R451 Restlessness and agitation: Secondary | ICD-10-CM | POA: Diagnosis not present

## 2015-09-10 DIAGNOSIS — G8929 Other chronic pain: Secondary | ICD-10-CM | POA: Diagnosis not present

## 2015-09-10 DIAGNOSIS — T23272A Burn of second degree of left wrist, initial encounter: Secondary | ICD-10-CM | POA: Diagnosis not present

## 2015-09-10 DIAGNOSIS — F1410 Cocaine abuse, uncomplicated: Secondary | ICD-10-CM | POA: Insufficient documentation

## 2015-09-10 DIAGNOSIS — Z7289 Other problems related to lifestyle: Secondary | ICD-10-CM

## 2015-09-10 LAB — RAPID URINE DRUG SCREEN, HOSP PERFORMED
Amphetamines: NOT DETECTED
Barbiturates: NOT DETECTED
Benzodiazepines: POSITIVE — AB
Cocaine: POSITIVE — AB
OPIATES: NOT DETECTED
Tetrahydrocannabinol: POSITIVE — AB

## 2015-09-10 LAB — COMPREHENSIVE METABOLIC PANEL
ALT: 9 U/L — AB (ref 14–54)
AST: 13 U/L — AB (ref 15–41)
Albumin: 3.6 g/dL (ref 3.5–5.0)
Alkaline Phosphatase: 67 U/L (ref 38–126)
Anion gap: 7 (ref 5–15)
BUN: 11 mg/dL (ref 6–20)
CALCIUM: 9 mg/dL (ref 8.9–10.3)
CO2: 25 mmol/L (ref 22–32)
CREATININE: 0.92 mg/dL (ref 0.44–1.00)
Chloride: 107 mmol/L (ref 101–111)
Glucose, Bld: 103 mg/dL — ABNORMAL HIGH (ref 65–99)
Potassium: 3.7 mmol/L (ref 3.5–5.1)
SODIUM: 139 mmol/L (ref 135–145)
Total Bilirubin: 0.3 mg/dL (ref 0.3–1.2)
Total Protein: 6.6 g/dL (ref 6.5–8.1)

## 2015-09-10 LAB — CBC WITH DIFFERENTIAL/PLATELET
Basophils Absolute: 0 10*3/uL (ref 0.0–0.1)
Basophils Relative: 0 %
Eosinophils Absolute: 0.1 10*3/uL (ref 0.0–0.7)
Eosinophils Relative: 2 %
HCT: 42.7 % (ref 36.0–46.0)
HEMOGLOBIN: 14.6 g/dL (ref 12.0–15.0)
LYMPHS ABS: 4.3 10*3/uL — AB (ref 0.7–4.0)
LYMPHS PCT: 47 %
MCH: 31.3 pg (ref 26.0–34.0)
MCHC: 34.2 g/dL (ref 30.0–36.0)
MCV: 91.6 fL (ref 78.0–100.0)
Monocytes Absolute: 0.6 10*3/uL (ref 0.1–1.0)
Monocytes Relative: 7 %
Neutro Abs: 4.1 10*3/uL (ref 1.7–7.7)
Neutrophils Relative %: 44 %
Platelets: 224 10*3/uL (ref 150–400)
RBC: 4.66 MIL/uL (ref 3.87–5.11)
RDW: 14.2 % (ref 11.5–15.5)
WBC: 9.2 10*3/uL (ref 4.0–10.5)

## 2015-09-10 LAB — ACETAMINOPHEN LEVEL: Acetaminophen (Tylenol), Serum: <10 ug/mL — ABNORMAL LOW (ref 10–30)

## 2015-09-10 LAB — ETHANOL: Alcohol, Ethyl (B): 7 mg/dL — ABNORMAL HIGH (ref <5)

## 2015-09-10 LAB — SALICYLATE LEVEL: Salicylate Lvl: <4 mg/dL (ref 2.8–30.0)

## 2015-09-10 MED ORDER — ALBUTEROL SULFATE HFA 108 (90 BASE) MCG/ACT IN AERS
2.0000 | INHALATION_SPRAY | Freq: Four times a day (QID) | RESPIRATORY_TRACT | Status: DC | PRN
  Filled 2015-09-10: qty 6.7

## 2015-09-10 MED ORDER — LORAZEPAM 1 MG PO TABS
1.0000 mg | ORAL_TABLET | Freq: Once | ORAL | Status: AC
  Administered 2015-09-10: 1 mg via ORAL
  Filled 2015-09-10: qty 1

## 2015-09-10 MED ORDER — ASPIRIN 325 MG PO TABS
325.0000 mg | ORAL_TABLET | Freq: Once | ORAL | Status: DC
  Filled 2015-09-10: qty 1

## 2015-09-10 MED ORDER — PRAZOSIN HCL 1 MG PO CAPS: 2.0000 mg | ORAL_CAPSULE | Freq: Every day | ORAL | Status: DC

## 2015-09-10 MED ORDER — CLONAZEPAM 0.5 MG PO TABS
2.0000 mg | ORAL_TABLET | Freq: Three times a day (TID) | ORAL | Status: DC
  Administered 2015-09-11: 2 mg via ORAL
  Filled 2015-09-10: qty 4

## 2015-09-10 MED ORDER — VITAMIN C 500 MG PO TABS
250.0000 mg | ORAL_TABLET | Freq: Every day | ORAL | Status: DC
  Administered 2015-09-11: 250 mg via ORAL
  Filled 2015-09-10 (×4): qty 0.5

## 2015-09-10 MED ORDER — DIPHENHYDRAMINE-APAP (SLEEP) 25-500 MG PO TABS: 1.0000 | ORAL_TABLET | Freq: Every evening | ORAL | Status: DC | PRN

## 2015-09-10 MED ORDER — ALBUTEROL SULFATE (2.5 MG/3ML) 0.083% IN NEBU
2.5000 mg | INHALATION_SOLUTION | Freq: Four times a day (QID) | RESPIRATORY_TRACT | Status: DC | PRN
  Filled 2015-09-10: qty 3

## 2015-09-10 MED ORDER — SODIUM CHLORIDE 0.9 % IV BOLUS (SEPSIS)
1000.0000 mL | Freq: Once | INTRAVENOUS | Status: AC
  Administered 2015-09-10: 1000 mL via INTRAVENOUS
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
## 2015-09-10 NOTE — ED Notes (Signed)
Spoke with Kara Kelly at [employer] [facility] who advised that pt would need to be monitored for 6 hours before she would be medically cleared, Dr Estell Harpin notified,

## 2015-09-10 NOTE — ED Notes (Signed)
Pt anxious, tearful, Dr Estell Harpin notified, additional orders given,

## 2015-09-11 ENCOUNTER — Inpatient Hospital Stay (HOSPITAL_COMMUNITY)
Admission: AD | Admit: 2015-09-11 | Discharge: 2015-09-17 | DRG: 885 | Disposition: A | Discharge status: Home / Self Care | Payer: No Typology Code available for payment source | Attending: Psychiatry | Admitting: Psychiatry

## 2015-09-11 ENCOUNTER — Encounter (HOSPITAL_COMMUNITY): Payer: Self-pay

## 2015-09-11 DIAGNOSIS — M549 Dorsalgia, unspecified: Secondary | ICD-10-CM | POA: Diagnosis present

## 2015-09-11 DIAGNOSIS — F1220 Cannabis dependence, uncomplicated: Secondary | ICD-10-CM | POA: Diagnosis present

## 2015-09-11 DIAGNOSIS — K219 Gastro-esophageal reflux disease without esophagitis: Secondary | ICD-10-CM | POA: Diagnosis present

## 2015-09-11 DIAGNOSIS — F1020 Alcohol dependence, uncomplicated: Secondary | ICD-10-CM | POA: Diagnosis present

## 2015-09-11 DIAGNOSIS — F332 Major depressive disorder, recurrent severe without psychotic features: Secondary | ICD-10-CM | POA: Insufficient documentation

## 2015-09-11 DIAGNOSIS — F411 Generalized anxiety disorder: Secondary | ICD-10-CM | POA: Diagnosis present

## 2015-09-11 DIAGNOSIS — F3162 Bipolar disorder, current episode mixed, moderate: Secondary | ICD-10-CM | POA: Diagnosis present

## 2015-09-11 DIAGNOSIS — F603 Borderline personality disorder: Secondary | ICD-10-CM | POA: Diagnosis not present

## 2015-09-11 DIAGNOSIS — B182 Chronic viral hepatitis C: Secondary | ICD-10-CM | POA: Diagnosis present

## 2015-09-11 DIAGNOSIS — G4700 Insomnia, unspecified: Secondary | ICD-10-CM | POA: Diagnosis present

## 2015-09-11 DIAGNOSIS — Z8249 Family history of ischemic heart disease and other diseases of the circulatory system: Secondary | ICD-10-CM

## 2015-09-11 DIAGNOSIS — Z8051 Family history of malignant neoplasm of kidney: Secondary | ICD-10-CM | POA: Diagnosis not present

## 2015-09-11 DIAGNOSIS — F410 Panic disorder [episodic paroxysmal anxiety] without agoraphobia: Secondary | ICD-10-CM | POA: Diagnosis present

## 2015-09-11 DIAGNOSIS — J449 Chronic obstructive pulmonary disease, unspecified: Secondary | ICD-10-CM | POA: Diagnosis present

## 2015-09-11 DIAGNOSIS — J45909 Unspecified asthma, uncomplicated: Secondary | ICD-10-CM | POA: Diagnosis present

## 2015-09-11 DIAGNOSIS — F1420 Cocaine dependence, uncomplicated: Secondary | ICD-10-CM | POA: Diagnosis present

## 2015-09-11 DIAGNOSIS — G8929 Other chronic pain: Secondary | ICD-10-CM | POA: Diagnosis present

## 2015-09-11 DIAGNOSIS — F1320 Sedative, hypnotic or anxiolytic dependence, uncomplicated: Secondary | ICD-10-CM | POA: Diagnosis present

## 2015-09-11 DIAGNOSIS — E785 Hyperlipidemia, unspecified: Secondary | ICD-10-CM | POA: Diagnosis present

## 2015-09-11 DIAGNOSIS — F4310 Post-traumatic stress disorder, unspecified: Secondary | ICD-10-CM | POA: Diagnosis present

## 2015-09-11 DIAGNOSIS — F1994 Other psychoactive substance use, unspecified with psychoactive substance-induced mood disorder: Secondary | ICD-10-CM | POA: Diagnosis present

## 2015-09-11 DIAGNOSIS — F329 Major depressive disorder, single episode, unspecified: Secondary | ICD-10-CM | POA: Diagnosis not present

## 2015-09-11 DIAGNOSIS — R45851 Suicidal ideations: Secondary | ICD-10-CM | POA: Diagnosis present

## 2015-09-11 DIAGNOSIS — F17210 Nicotine dependence, cigarettes, uncomplicated: Secondary | ICD-10-CM | POA: Diagnosis present

## 2015-09-11 DIAGNOSIS — Z811 Family history of alcohol abuse and dependence: Secondary | ICD-10-CM | POA: Diagnosis not present

## 2015-09-11 MED ORDER — ALUM & MAG HYDROXIDE-SIMETH 200-200-20 MG/5ML PO SUSP: 30.0000 mL | ORAL | Status: DC | PRN

## 2015-09-11 MED ORDER — ACETAMINOPHEN 325 MG PO TABS
650.0000 mg | ORAL_TABLET | Freq: Four times a day (QID) | ORAL | Status: DC | PRN
  Administered 2015-09-13 – 2015-09-17 (×4): 650 mg via ORAL
  Filled 2015-09-11 (×4): qty 2

## 2015-09-11 MED ORDER — NICOTINE 14 MG/24HR TD PT24
14.0000 mg | MEDICATED_PATCH | Freq: Once | TRANSDERMAL | Status: DC
  Administered 2015-09-11: 14 mg via TRANSDERMAL

## 2015-09-11 MED ORDER — TRAZODONE HCL 50 MG PO TABS
50.0000 mg | ORAL_TABLET | Freq: Every evening | ORAL | Status: DC | PRN
  Administered 2015-09-11 – 2015-09-12 (×2): 50 mg via ORAL
  Filled 2015-09-11 (×9): qty 1

## 2015-09-11 MED ORDER — LORAZEPAM 1 MG PO TABS
1.0000 mg | ORAL_TABLET | Freq: Three times a day (TID) | ORAL | Status: DC | PRN
  Administered 2015-09-11: 1 mg via ORAL
  Filled 2015-09-11: qty 1

## 2015-09-11 MED ORDER — NICOTINE 14 MG/24HR TD PT24
MEDICATED_PATCH | TRANSDERMAL | Status: AC
  Filled 2015-09-11: qty 1

## 2015-09-11 MED ORDER — MAGNESIUM HYDROXIDE 400 MG/5ML PO SUSP: 30.0000 mL | Freq: Every day | ORAL | Status: DC | PRN

## 2015-09-11 MED ORDER — NICOTINE 7 MG/24HR TD PT24: 7.0000 mg | MEDICATED_PATCH | Freq: Once | TRANSDERMAL | Status: DC

## 2015-09-11 MED ORDER — ACETAMINOPHEN 500 MG PO TABS: 500.0000 mg | ORAL_TABLET | Freq: Every evening | ORAL | Status: DC | PRN

## 2015-09-11 MED ORDER — DIPHENHYDRAMINE HCL 25 MG PO CAPS: 25.0000 mg | ORAL_CAPSULE | Freq: Every evening | ORAL | Status: DC | PRN

## 2015-09-11 MED ORDER — SILVER SULFADIAZINE 1 % EX CREA
TOPICAL_CREAM | Freq: Every day | CUTANEOUS | Status: DC
  Administered 2015-09-11: 1 via TOPICAL
  Filled 2015-09-11: qty 50

## 2015-09-11 MED ORDER — PRAZOSIN HCL 5 MG PO CAPS: 5.0000 mg | ORAL_CAPSULE | Freq: Two times a day (BID) | ORAL | Status: DC

## 2015-09-11 MED ORDER — PRAZOSIN HCL 5 MG PO CAPS
5.0000 mg | ORAL_CAPSULE | Freq: Two times a day (BID) | ORAL | Status: DC
  Administered 2015-09-11: 5 mg via ORAL
  Filled 2015-09-11 (×7): qty 1

## 2015-09-11 MED ORDER — GABAPENTIN 300 MG PO CAPS
600.0000 mg | ORAL_CAPSULE | Freq: Three times a day (TID) | ORAL | Status: DC
  Administered 2015-09-11: 600 mg via ORAL
  Filled 2015-09-11: qty 2

## 2015-09-11 MED ORDER — ONDANSETRON HCL 4 MG PO TABS: 4.0000 mg | ORAL_TABLET | Freq: Three times a day (TID) | ORAL | Status: DC | PRN

## 2015-09-11 MED ORDER — NICOTINE 21 MG/24HR TD PT24
21.0000 mg | MEDICATED_PATCH | Freq: Every day | TRANSDERMAL | Status: DC
  Administered 2015-09-12 – 2015-09-17 (×6): 21 mg via TRANSDERMAL
  Filled 2015-09-11 (×9): qty 1

## 2015-09-11 MED ORDER — ACETAMINOPHEN 325 MG PO TABS: 650.0000 mg | ORAL_TABLET | ORAL | Status: DC | PRN

## 2015-09-11 MED ORDER — HYDROXYZINE HCL 25 MG PO TABS
25.0000 mg | ORAL_TABLET | Freq: Four times a day (QID) | ORAL | Status: DC | PRN
  Administered 2015-09-12: 25 mg via ORAL
  Filled 2015-09-11: qty 1

## 2015-09-11 MED ORDER — ZOLPIDEM TARTRATE 5 MG PO TABS: 5.0000 mg | ORAL_TABLET | Freq: Every evening | ORAL | Status: DC | PRN

## 2015-09-11 MED ORDER — IBUPROFEN 400 MG PO TABS
600.0000 mg | ORAL_TABLET | Freq: Three times a day (TID) | ORAL | Status: DC | PRN
  Administered 2015-09-11 (×2): 600 mg via ORAL
  Filled 2015-09-11 (×2): qty 2

## 2015-09-11 NOTE — ED Notes (Signed)
[facility] here to serve pt, sitter remains at bedside,
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
## 2015-09-11 NOTE — ED Notes (Signed)
Pt c/o feeling anxious

## 2015-09-11 NOTE — Tx Team (Signed)
Initial Interdisciplinary Treatment Plan   PATIENT STRESSORS: Financial difficulties Medication change or noncompliance Occupational concerns   PATIENT STRENGTHS: Ability for insight Average or above average intelligence Capable of independent living General fund of knowledge Supportive family/friends   PROBLEM LIST: Problem List/Patient Goals Date to be addressed Date deferred Reason deferred Estimated date of resolution  "Get me straight" 09/11/2015     "Get some counseling"  09/11/2015     "Get a psychiatrist in my town" 09/11/2015                                          DISCHARGE CRITERIA:  Ability to meet basic life and health needs Adequate post-discharge living arrangements Improved stabilization in mood, thinking, and/or behavior Reduction of life-threatening or endangering symptoms to within safe limits Verbal commitment to aftercare and medication compliance Withdrawal symptoms are absent or subacute and managed without 24-hour nursing intervention  PRELIMINARY DISCHARGE PLAN: Attend 12-step recovery group Outpatient therapy Return to previous living arrangement  PATIENT/FAMIILY INVOLVEMENT: This treatment plan has been presented to and reviewed with the patient, Kara Kelly.  The patient and family have been given the opportunity to ask questions and make suggestions.  [name] 09/11/2015, 8:54 PM
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
## 2015-09-11 NOTE — ED Notes (Signed)
Pt's belongings given in care of [facility]. Patient clothes in bag that was locked up in locker, no shoes in bag, patient states she had a pair of camo crocs slip on shoes. These shoes were not in bag or in any other locker. Pt ok with this but will continue to look for shoes. Pt. Given anti slip socks to wear. Patient IV removed.

## 2015-09-11 NOTE — ED Notes (Signed)
telepsych being performed, pt given warm blanket,

## 2015-09-11 NOTE — ED Notes (Signed)
[facility] in route to transport patient to [facility].

## 2015-09-11 NOTE — ED Notes (Signed)
Patient put in burgundy scrubs.  

## 2015-09-11 NOTE — ED Notes (Signed)
resp even and non labored, sitter remains at bedside,

## 2015-09-11 NOTE — Progress Notes (Signed)
Pt accepted to [facility] bed 305-1 by Dr. Dub Mikes. Pt is under IVC. Report can be called at [phone].  Ilean Skill, MSW, LCSW Clinical Social Work, Disposition  09/11/2015 [phone]
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
## 2015-09-11 NOTE — ED Notes (Signed)
C/o back pain.  Ibuprofen given.

## 2015-09-11 NOTE — Progress Notes (Signed)
Spoke with Vernona Rieger, Clinical cytogeneticist at [facility]. She states that as pt has [employer], pt is not eligible for behavioral health service through the [place]. Recommends placement at non-va facilities is appropriate. Will seek inpatient placement.  Ilean Skill, MSW, LCSW Clinical Social Work, Disposition  09/11/2015 [phone]
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
## 2015-09-11 NOTE — ED Notes (Signed)
Pt requesting something for headache, anxiety, Dr Preston Fleeting notified, additional orders given,

## 2015-09-12 ENCOUNTER — Encounter (HOSPITAL_COMMUNITY): Payer: Self-pay | Admitting: Registered Nurse

## 2015-09-12 DIAGNOSIS — F1020 Alcohol dependence, uncomplicated: Secondary | ICD-10-CM

## 2015-09-12 DIAGNOSIS — F1220 Cannabis dependence, uncomplicated: Secondary | ICD-10-CM

## 2015-09-12 DIAGNOSIS — F1420 Cocaine dependence, uncomplicated: Secondary | ICD-10-CM | POA: Clinically undetermined

## 2015-09-12 DIAGNOSIS — F4310 Post-traumatic stress disorder, unspecified: Secondary | ICD-10-CM

## 2015-09-12 DIAGNOSIS — F1390 Sedative, hypnotic, or anxiolytic use, unspecified, uncomplicated: Secondary | ICD-10-CM

## 2015-09-12 DIAGNOSIS — F603 Borderline personality disorder: Secondary | ICD-10-CM | POA: Diagnosis present

## 2015-09-12 DIAGNOSIS — F1320 Sedative, hypnotic or anxiolytic dependence, uncomplicated: Secondary | ICD-10-CM | POA: Diagnosis present

## 2015-09-12 MED ORDER — ONDANSETRON 4 MG PO TBDP
4.0000 mg | ORAL_TABLET | Freq: Four times a day (QID) | ORAL | Status: AC | PRN
  Administered 2015-09-12 – 2015-09-13 (×2): 4 mg via ORAL
  Filled 2015-09-12 (×2): qty 1

## 2015-09-12 MED ORDER — CHLORDIAZEPOXIDE HCL 25 MG PO CAPS
25.0000 mg | ORAL_CAPSULE | Freq: Once | ORAL | Status: AC
Start: 2015-09-12 — End: 2015-09-12
  Administered 2015-09-12: 25 mg via ORAL
  Filled 2015-09-12: qty 1

## 2015-09-12 MED ORDER — LOPERAMIDE HCL 2 MG PO CAPS: 2.0000 mg | ORAL_CAPSULE | ORAL | Status: AC | PRN

## 2015-09-12 MED ORDER — GABAPENTIN 300 MG PO CAPS
600.0000 mg | ORAL_CAPSULE | Freq: Three times a day (TID) | ORAL | Status: DC
  Administered 2015-09-12 – 2015-09-14 (×5): 600 mg via ORAL
  Filled 2015-09-12 (×8): qty 2

## 2015-09-12 MED ORDER — CHLORDIAZEPOXIDE HCL 25 MG PO CAPS: 25.0000 mg | ORAL_CAPSULE | Freq: Four times a day (QID) | ORAL | Status: DC | PRN

## 2015-09-12 MED ORDER — HYDROXYZINE HCL 25 MG PO TABS
25.0000 mg | ORAL_TABLET | Freq: Four times a day (QID) | ORAL | Status: AC | PRN
  Administered 2015-09-14: 25 mg via ORAL
  Filled 2015-09-12: qty 1

## 2015-09-12 MED ORDER — VITAMIN B-1 100 MG PO TABS
100.0000 mg | ORAL_TABLET | Freq: Every day | ORAL | Status: DC
  Administered 2015-09-13 – 2015-09-17 (×5): 100 mg via ORAL
  Filled 2015-09-12 (×8): qty 1

## 2015-09-12 MED ORDER — THIAMINE HCL 100 MG/ML IJ SOLN: 100.0000 mg | Freq: Once | INTRAMUSCULAR | Status: DC

## 2015-09-12 MED ORDER — CHLORDIAZEPOXIDE HCL 25 MG PO CAPS: 25.0000 mg | ORAL_CAPSULE | Freq: Every day | ORAL | Status: DC

## 2015-09-12 MED ORDER — SILVER SULFADIAZINE 1 % EX CREA
TOPICAL_CREAM | Freq: Every day | CUTANEOUS | Status: DC | PRN
  Administered 2015-09-12 – 2015-09-16 (×3): via TOPICAL
  Filled 2015-09-12: qty 85

## 2015-09-12 MED ORDER — GABAPENTIN 600 MG PO TABS
300.0000 mg | ORAL_TABLET | Freq: Three times a day (TID) | ORAL | Status: DC
  Filled 2015-09-12 (×6): qty 0.5

## 2015-09-12 MED ORDER — ADULT MULTIVITAMIN W/MINERALS CH
1.0000 | ORAL_TABLET | Freq: Every day | ORAL | Status: DC
  Administered 2015-09-12 – 2015-09-17 (×6): 1 via ORAL
  Filled 2015-09-12 (×9): qty 1

## 2015-09-12 MED ORDER — CHLORDIAZEPOXIDE HCL 25 MG PO CAPS: 25.0000 mg | ORAL_CAPSULE | ORAL | Status: DC

## 2015-09-12 MED ORDER — VITAMIN B-1 100 MG PO TABS
100.0000 mg | ORAL_TABLET | Freq: Once | ORAL | Status: AC
  Administered 2015-09-12: 100 mg via ORAL
  Filled 2015-09-12: qty 1

## 2015-09-12 MED ORDER — CHLORDIAZEPOXIDE HCL 25 MG PO CAPS
25.0000 mg | ORAL_CAPSULE | Freq: Four times a day (QID) | ORAL | Status: AC
  Administered 2015-09-12 – 2015-09-13 (×4): 25 mg via ORAL
  Filled 2015-09-12 (×4): qty 1

## 2015-09-12 MED ORDER — CHLORDIAZEPOXIDE HCL 25 MG PO CAPS
25.0000 mg | ORAL_CAPSULE | Freq: Three times a day (TID) | ORAL | Status: AC
  Administered 2015-09-13 – 2015-09-14 (×3): 25 mg via ORAL
  Filled 2015-09-12 (×3): qty 1
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
## 2015-09-12 NOTE — Progress Notes (Signed)
D:Pt has been agitated and anxious on the unit this morning and tearful at times. Pt c/o medications not working and wanting additional medications. A:Reported to NP for pt evaluation. Offered support, encouragement and 15 minute checks. R:Safety maintained on the unit.

## 2015-09-12 NOTE — Progress Notes (Signed)
Patient attended wrap-up group and rated her day a 2. Stated she had a bad day because she received her medications late. Goal is to have a better day tomorrow.

## 2015-09-12 NOTE — [facility] Group Notes (Signed)
[facility] Group Notes:  (Clinical Social Work)   05/30/2015     10:00-11:00AM  Summary of Progress/Problems:   In today's process group a decisional balance exercise was used to explore in depth the perceived benefits and costs of alcohol and drugs, as well as the  benefits and costs of replacing these with healthy coping skills.  Patients listed human needs and unhealthy coping techniques of trying to get needs met, determining with CSW guidance that unhealthy coping techniques work initially, but eventually become harmful.  Motivational Interviewing and the whiteboard were utilized for the exercises.  The patient expressed that the unhealthy coping she uses is NOT alcohol or drugs, although she has a recent relapse.  She eventually shared that she used to use pills a lot, to the extent they gravely impacted her liver function.  She was initially very jittery and tearful while sharing in group, but was pulled out to see a practitioner, and later was much calmer, with minimal leg agitation.  She participated fully and with insight.  Type of Therapy:  Group Therapy - Process   Participation Level:  Active  Participation Quality:  Attentive and Sharing  Affect:  Anxious and Blunted  Cognitive:  Appropriate  Insight:  Developing/Improving  Engagement in Therapy:  Engaged  Modes of Intervention:  Education, Motivational Interviewing  Selmer Dominion, LCSW 09/12/2015, 12:20 PM
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
## 2015-09-13 DIAGNOSIS — F332 Major depressive disorder, recurrent severe without psychotic features: Secondary | ICD-10-CM | POA: Insufficient documentation

## 2015-09-13 MED ORDER — SERTRALINE HCL 25 MG PO TABS
25.0000 mg | ORAL_TABLET | Freq: Every day | ORAL | Status: DC
  Administered 2015-09-13: 25 mg via ORAL
  Filled 2015-09-13 (×3): qty 1

## 2015-09-13 MED ORDER — PRAZOSIN HCL 2 MG PO CAPS
2.0000 mg | ORAL_CAPSULE | Freq: Every day | ORAL | Status: DC
  Administered 2015-09-13 – 2015-09-16 (×4): 2 mg via ORAL
  Filled 2015-09-13: qty 1
  Filled 2015-09-13: qty 7
  Filled 2015-09-13 (×4): qty 1

## 2015-09-13 MED ORDER — TRAZODONE HCL 100 MG PO TABS
100.0000 mg | ORAL_TABLET | Freq: Every evening | ORAL | Status: DC | PRN
  Administered 2015-09-13: 100 mg via ORAL
  Filled 2015-09-13: qty 1
  Filled 2015-09-13: qty 7

## 2015-09-13 MED ORDER — ALBUTEROL SULFATE HFA 108 (90 BASE) MCG/ACT IN AERS: 2.0000 | INHALATION_SPRAY | Freq: Four times a day (QID) | RESPIRATORY_TRACT | Status: DC | PRN

## 2015-09-13 NOTE — Progress Notes (Signed)
Pt attended AA group this evening.  

## 2015-09-13 NOTE — Progress Notes (Signed)
D: Patient alert and oriented x 4. Patient denies pain/SI/HI/AVH. Patient states, "I am doing much better today than I was last night."  A: Staff to monitor Q 15 mins for safety. Encouragement and support offered. Scheduled medications administered per orders. R: Patient remains safe on the unit. Patient attended group tonight. Patient visible on hte unit and interacting with peers. Patient taking administered medications.
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
## 2015-09-13 NOTE — Plan of Care (Signed)
Problem: Ineffective individual coping Goal: STG: Patient will remain free from self harm Outcome: Progressing Patient has not engaged in self harm. Denies SI  Problem: Alteration in mood & ability to function due to Goal: STG-Patient will comply with prescribed medication regimen (Patient will comply with prescribed medication regimen)  Outcome: Progressing Patient has been med compliant.     

## 2015-09-13 NOTE — Progress Notes (Signed)
Patient up and visible in the milieu. Rates depression at a 9/10, denies hopelessness and anxiety is at a 5/10. Affect is flat, depressed with congruent mood. Reports chronic back pain of a 5/10 and received tylenol prior to start of shift. Reports her goal for today is to "stay calm." Patient medicated per orders. Emotional support offered. Self inventory reviewed. Patient denies SI/HI and remains safe on level III obs. Lawrence [place]
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
## 2015-09-13 NOTE — Progress Notes (Signed)
D: Patient in the dayroom on approach.  Patient states she had an ok day.  Patient states she has been increasingly anxiety today.  Patient states her goal was to make it through  the day.  Patient has blunted affect and anxious mood.  Patient denies SI/HI and denies AVH. A: Staff to monitor Q 15 mins for safety.  Encouragement and support offered.  Scheduled medications administered per orders.  Trazodone administered prn for sleep. R: Patient remains safe on the unit.  Patient attended group tonight.  Patient visible on the unit and interacting with peers.  Patient taking administered medicatons

## 2015-09-13 NOTE — [facility] Group Notes (Signed)
[facility] Group Notes:  (Nursing/MHT/Case Management/Adjunct)  Date:  09/13/2015  Time:  1:15 pm  Type of Therapy:  Nurse Education  Participation Level:  Active  Participation Quality:  Appropriate, Attentive, Sharing and Supportive  Affect:  Appropriate  Cognitive:  Alert and Oriented  Insight:  Appropriate  Engagement in Group:  Engaged and Supportive  Modes of Intervention:  Discussion and Education  Summary of Progress/Problems:  Group topic was [employer].  Discussed importance of setting daily goals.  She was attentive and supportive of others during the group.  She talked about her past and how she got lax about her recovery.   Norm Parcel Conley Pawling 09/13/2015, 2:13 PM

## 2015-09-14 ENCOUNTER — Encounter (HOSPITAL_COMMUNITY): Payer: Self-pay | Admitting: Psychiatry

## 2015-09-14 DIAGNOSIS — R45851 Suicidal ideations: Secondary | ICD-10-CM

## 2015-09-14 DIAGNOSIS — F3162 Bipolar disorder, current episode mixed, moderate: Principal | ICD-10-CM

## 2015-09-14 MED ORDER — CARBAMAZEPINE 100 MG PO CHEW
100.0000 mg | CHEWABLE_TABLET | Freq: Three times a day (TID) | ORAL | Status: DC
  Administered 2015-09-15 – 2015-09-17 (×8): 100 mg via ORAL
  Filled 2015-09-14 (×7): qty 1
  Filled 2015-09-14 (×2): qty 21
  Filled 2015-09-14 (×4): qty 1
  Filled 2015-09-14: qty 21

## 2015-09-14 MED ORDER — TOPIRAMATE 25 MG PO TABS
50.0000 mg | ORAL_TABLET | Freq: Every day | ORAL | Status: DC
  Administered 2015-09-14 – 2015-09-16 (×3): 50 mg via ORAL
  Filled 2015-09-14 (×3): qty 2
  Filled 2015-09-14 (×2): qty 1
  Filled 2015-09-14: qty 2
  Filled 2015-09-14: qty 14

## 2015-09-14 MED ORDER — GABAPENTIN 400 MG PO CAPS
800.0000 mg | ORAL_CAPSULE | Freq: Three times a day (TID) | ORAL | Status: DC
  Administered 2015-09-14 – 2015-09-17 (×10): 800 mg via ORAL
  Filled 2015-09-14 (×2): qty 2
  Filled 2015-09-14: qty 42
  Filled 2015-09-14 (×7): qty 2
  Filled 2015-09-14 (×2): qty 42
  Filled 2015-09-14 (×4): qty 2

## 2015-09-14 MED ORDER — TOPIRAMATE 25 MG PO TABS: 25.0000 mg | ORAL_TABLET | Freq: Every day | ORAL | Status: DC

## 2015-09-14 MED ORDER — SERTRALINE HCL 50 MG PO TABS
50.0000 mg | ORAL_TABLET | Freq: Every day | ORAL | Status: DC
  Administered 2015-09-14 – 2015-09-16 (×3): 50 mg via ORAL
  Filled 2015-09-14: qty 1
  Filled 2015-09-14: qty 7
  Filled 2015-09-14 (×4): qty 1

## 2015-09-14 MED ORDER — CLONAZEPAM 1 MG PO TABS
1.0000 mg | ORAL_TABLET | Freq: Three times a day (TID) | ORAL | Status: DC
  Administered 2015-09-14 – 2015-09-17 (×9): 1 mg via ORAL
  Filled 2015-09-14 (×9): qty 1

## 2015-09-14 NOTE — [facility] Group Notes (Signed)
[facility] LCSW Aftercare Discharge Planning Group Note   09/14/2015 11:36 AM  Participation Quality:  Appropriate   Mood/Affect:  Angry, Irritable and Labile  Depression Rating:  10  Anxiety Rating:  10  Thoughts of Suicide:  No Will you contract for safety?   NA  Current AVH:  No  Plan for Discharge/Comments:  Pt reports that she is anxious, has PTSD with nightmares, and is agitated this morning. Pt wants "to just get my medication right so I can go home." Pt reports that she lives alone, next door to her mother. Pt states that she does not want to return to [facility] if she has to see Dr. Geanie Cooley. CSW exploring other outpatient options.   Transportation Means: family member  Supports: mother  Counselling psychologist, Parnell LCSW

## 2015-09-14 NOTE — Progress Notes (Addendum)
Patient woke up this morning tearful complaining of severe night terrors. Patient states this is the first time in in 4 years she has had dreams like that.

## 2015-09-14 NOTE — Progress Notes (Signed)
Adult Psychoeducational Group Note  Date:  09/14/2015 Time:  10:27 PM  Group Topic/Focus:  Wrap-Up Group:   The focus of this group is to help patients review their daily goal of treatment and discuss progress on daily workbooks.  Participation Level:  Active  Participation Quality:  Appropriate  Affect:  Appropriate  Cognitive:  Alert  Insight: Appropriate  Engagement in Group:  Engaged  Modes of Intervention:  Discussion  Additional Comments:  Patient goal for today was to keep calm and make it through the day. On a scale between 1-10, (1=worse, 10=best) patient rated her day a 2 up until 5pm because "my anxiety was real bad.   Tieler Cournoyer L Sapna Padron 09/14/2015, 10:27 PM
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
## 2015-09-14 NOTE — Progress Notes (Signed)
D:Patient in the dayroom on approach.  Patient states she had a better day after talking to the doctor.  Patient states she had some med changes and thinks they were helpful.  Patient does rate depression 2/10 and anxiety 0 tonight.  Patient denies SI/HI and denies AVH.   A: Staff to monitor Q 15 mins for safety.  Encouragement and support offered.  Scheduled medications administered per orders.   R: Patient remains safe on the unit.  Patient attended group tonight.  Patient visible on the unit and interacting with peers.  Patient taking administered medications.
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
## 2015-09-14 NOTE — Plan of Care (Signed)
Problem: Alteration in mood & ability to function due to Goal: STG-Patient will report withdrawal symptoms Outcome: Progressing Patient states she is not withdrawing from substances. Denies all symptoms.

## 2015-09-14 NOTE — [facility] Group Notes (Signed)
[facility] LCSW Group Therapy  09/14/2015 1:28 PM  Type of Therapy:  Group Therapy  Participation Level:  Did Not Attend-pt invited. Chose to remain in bed.   Modes of Intervention:  Confrontation, Discussion, Education, Exploration, Problem-solving, Rapport Building, Socialization and Support  Summary of Progress/Problems: Today's Topic: Overcoming Obstacles. Patients identified one short term goal and potential obstacles in reaching this goal. Patients processed barriers involved in overcoming these obstacles. Patients identified steps necessary for overcoming these obstacles and explored motivation (internal and external) for facing these difficulties head on.   Smart, Kara Rarick LCSW 09/14/2015, 1:28 PM
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
## 2015-09-15 NOTE — Plan of Care (Signed)
Problem: Ineffective individual coping Goal: LTG: Patient will report a decrease in negative feelings Outcome: Not Progressing Pt mood labile, attitude negative

## 2015-09-15 NOTE — Progress Notes (Signed)
Patient did attend the evening speaker AA meeting.  

## 2015-09-15 NOTE — Progress Notes (Signed)
Patient ID: Kara Kelly, female   DOB: 09/09/1965, 50 y.o.   MRN: [id number]  Pt currently presents with a blunted affect and labile behavior. Per self inventory, pt rates depression at a 3, hopelessness 0 and anxiety 0. Pt's daily goal is to "to be positive and stay sober" and they intend to do so by "talk." Pt reports good sleep, a good appetite and normal energy. Pt is easily agitated, concerned about taking a mood stabilizer.   Pt provided with medications per providers orders, pt adherant. Pt's labs and vitals were monitored throughout the day. Pt supported emotionally and encouraged to express concerns and questions. Pt educated on medications.  Pt's safety ensured with 15 minute and environmental checks. Pt currently denies SI/HI and A/V hallucinations. Pt verbally agrees to seek staff if SI/HI or A/VH occurs and to consult with staff before acting on these thoughts. Will continue POC.

## 2015-09-15 NOTE — Progress Notes (Signed)
Patient ID: Kara Kelly, female   DOB: 11-30-1965, 50 y.o.   MRN: [id number]  Adult Psychoeducational Group Note  Date:  09/15/2015 Time: 09:00am  Group Topic/Focus:  Recovery Goals:   The focus of this group is to identify appropriate goals for recovery and establish a plan to achieve them.  Participation Level:  None  Participation Quality:  Resistant  Affect:  Flat  Cognitive:  Alert and Oriented  Insight: None (expressed)  Engagement in Group: Poor, Resistant  Modes of Intervention:  Discussion, Education and Support  Additional Comments:  Pt choose not to participate in discussion about Recovery Goals. Pt was alert, oriented and left in the middle of the group.  Aurora Mask 09/15/2015, 12:38 PM

## 2015-09-15 NOTE — Progress Notes (Signed)
Recreation Therapy Notes  Animal-Assisted Activity (AAA) Program Checklist/Progress Notes Patient Eligibility Criteria Checklist & Daily Group note for Rec Tx Intervention  Date: 02.14.2017 Time: 2:45pm Location: [address] Dayroom    AAA/T Program Assumption of Risk Form signed by Patient/ or Parent Legal Guardian yes  Patient is free of allergies or sever asthma yes  Patient reports no fear of animals yes  Patient reports no history of cruelty to animals yes  Patient understands his/her participation is voluntary yes  Behavioral Response: Did not attend.   Tamica Covell L Ardyce Heyer, LRT/CTRS        Caila Cirelli L 09/15/2015 3:16 PM 

## 2015-09-15 NOTE — Plan of Care (Signed)
Problem: Ineffective individual coping Goal: STG: Patient will remain free from self harm Outcome: Progressing Pt remains safe at this time

## 2015-09-15 NOTE — [facility] Group Notes (Signed)
[facility] LCSW Group Therapy  09/15/2015 3:43 PM  Type of Therapy:  Group Therapy  Participation Level:  Did Not Attend-pt left shortly after group starting, saying that she felt sick.   Summary of Progress/Problems: [facility]HA Speaker came to talk about his personal journey with substance abuse and addiction. The pt processed ways by which to relate to the speaker. MHA speaker provided handouts and educational information pertaining to groups and services offered by the [facility].   Smart, Tremont Gavitt LCSW 09/15/2015, 3:43 PM
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
## 2015-09-16 NOTE — Progress Notes (Signed)
Pt attended evening NA group.  

## 2015-09-16 NOTE — [facility] Suicide Risk Assessment (Signed)
[facility] INPATIENT:  Family/Significant Other Suicide Prevention Education  Suicide Prevention Education:  Education Completed; Ms. Christena Flake (pt's mother) [phone] has been identified by the patient as the family member/significant other with whom the patient will be residing, and identified as the person(s) who will aid the patient in the event of a mental health crisis (suicidal ideations/suicide attempt).  With written consent from the patient, the family member/significant other has been provided the following suicide prevention education, prior to the and/or following the discharge of the patient.  The suicide prevention education provided includes the following:  Suicide risk factors  Suicide prevention and interventions  National Suicide Hotline telephone number  [facility] assessment telephone number  [facility] Emergency Assistance 911  [facility] and/or Residential Mobile Crisis Unit telephone number  Request made of family/significant other to:  Remove weapons (e.g., guns, rifles, knives), all items previously/currently identified as safety concern.    Remove drugs/medications (over-the-counter, prescriptions, illicit drugs), all items previously/currently identified as a safety concern.  The family member/significant other verbalizes understanding of the suicide prevention education information provided.  The family member/significant other agrees to remove the items of safety concern listed above.  Smart, Joyell Emami LCSW 09/16/2015, 11:09 AM

## 2015-09-16 NOTE — Progress Notes (Signed)
Recreation Therapy Notes  Date: 02.17.2017 Time: 9:30am Location: [address] Group Room   Group Topic: Stress Management  Goal Area(s) Addresses:  Patient will actively participate in stress management techniques presented during session.   Behavioral Response: Appropriate, Engaged   Intervention: Stress management techniques  Activity :  Deep Breathing and Guided Imagery. LRT provided instruction and demonstration on practice of Guided Imagery. Technique was coupled with deep breathing.   Education:  Stress Management, Discharge Planning.   Education Outcome: Acknowledges education  Clinical Observations/Feedback: Patient actively engaged in technique introduced, expressed no concerns and demonstrated ability to practice independently post d/c.   Kara Kelly, Kara Kelly        Kara Kelly 09/16/2015 2:04 PM 

## 2015-09-16 NOTE — Progress Notes (Signed)
D    Pt can be loud and intrusive at times   She is somewhat confrontive with other patients but redirects easily   She attends and participates in group    She endorses anxiety and depression A    Verbal support given   Medications administered and effectiveness monitored   Q 15 min checks R    Pt safe at present

## 2015-09-16 NOTE — [facility] Group Notes (Signed)
[facility] LCSW Aftercare Discharge Planning Group Note   09/16/2015 10:32 AM  Participation Quality:  Appropriate   Mood/Affect:  Appropriate  Depression Rating:  0  Anxiety Rating:  0  Thoughts of Suicide:  No Will you contract for safety?   NA  Current AVH:  No  Plan for Discharge/Comments:  Pt reports that she feels her medications are working well. Pt is requesting appt with her PCP and therapy at [facility]. CSW assessing. Pt reports that she will return home at d/c.   Transportation Means: mother  Supports: mother/some family supports  Counselling psychologist, Conservation officer, nature

## 2015-09-16 NOTE — Progress Notes (Addendum)
Patient ID: Trinna Post, female   DOB: 1965/09/01, 50 y.o.   MRN: [id number]   Pt currently presents with a blunted affect and exaggerated behaviors. Per self inventory, pt rates depression at a 0, hopelessness 0 and anxiety 8. Pt's daily goal is to "talking to Doc about helping me with some things" and they intend to do so by "talk to Doc." Pt reports good sleep, a good appetite, normal energy and good concentration. Pt mood less labile today, pt still easily agitated.   Pt provided with medications per providers orders. Pt's labs and vitals were monitored throughout the day. Pt supported emotionally and encouraged to express concerns and questions. Pt educated on medications. Pt wound assessed and new dressing placed this am, no new drainage noted, surrounding skin pink.   Pt's safety ensured with 15 minute and environmental checks. Pt currently denies SI/HI and A/V hallucinations. Pt verbally agrees to seek staff if SI/HI or A/VH occurs and to consult with staff before acting on these thoughts. Will continue POC.

## 2015-09-16 NOTE — Progress Notes (Signed)
Pt agreeable to seeing her PCP for medication management (prescribed a controlled substance) and was hoping for [facility] for counseling. At this time, [facility] is not accepting new clients without insurance. [facility] in [place], [place] will see patient for counseling only-she must walk in M-F between 8-10AM for assessment. CSW notified pt.   Trula Slade, MSW, LCSW Clinical Social Worker 09/16/2015 11:03 AM
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
## 2015-09-17 MED ORDER — CLONAZEPAM 1 MG PO TABS: 1.0000 mg | ORAL_TABLET | Freq: Three times a day (TID) | ORAL | Status: AC

## 2015-09-17 MED ORDER — PRAZOSIN HCL 2 MG PO CAPS: 2.0000 mg | ORAL_CAPSULE | Freq: Every day | ORAL | Status: AC

## 2015-09-17 MED ORDER — ALBUTEROL SULFATE HFA 108 (90 BASE) MCG/ACT IN AERS: 2.0000 | INHALATION_SPRAY | Freq: Four times a day (QID) | RESPIRATORY_TRACT | Status: AC | PRN

## 2015-09-17 MED ORDER — TRAZODONE HCL 100 MG PO TABS: 100.0000 mg | ORAL_TABLET | Freq: Every evening | ORAL | Status: AC | PRN

## 2015-09-17 MED ORDER — GABAPENTIN 400 MG PO CAPS: 800.0000 mg | ORAL_CAPSULE | Freq: Three times a day (TID) | ORAL | Status: AC

## 2015-09-17 MED ORDER — CARBAMAZEPINE 100 MG PO CHEW: 100.0000 mg | CHEWABLE_TABLET | Freq: Three times a day (TID) | ORAL | Status: AC

## 2015-09-17 MED ORDER — SERTRALINE HCL 50 MG PO TABS: 50.0000 mg | ORAL_TABLET | Freq: Every day | ORAL | Status: DC

## 2015-09-17 MED ORDER — TOPIRAMATE 50 MG PO TABS: 50.0000 mg | ORAL_TABLET | Freq: Every day | ORAL | Status: AC

## 2015-09-17 MED ORDER — SILVER SULFADIAZINE 1 % EX CREA: TOPICAL_CREAM | Freq: Every day | CUTANEOUS | Status: DC | PRN

## 2015-09-17 MED ORDER — NICOTINE 21 MG/24HR TD PT24: 21.0000 mg | MEDICATED_PATCH | Freq: Every day | TRANSDERMAL | Status: DC

## 2015-09-17 NOTE — Progress Notes (Signed)
Pt reports she is doing good this evening.  She denies SI/HI/AVH.  She says that she may be discharged tomorrow and will be returning home until she can get into a residential program.  She says that her mother has agreed to keep her medications and she will go to her mom's when it is time to take her meds.  She is waiting on a bed at [facility] and speaks highly of the program there.  She has been in the dayroom talking with peers and interacting appropriately.  Pt makes her needs known to staff.  Support and encouragement offered.  Safety maintained with q15 minute checks.

## 2015-09-17 NOTE — Progress Notes (Signed)
  [facility] Adult Case Management Discharge Plan :  Will you be returning to the same living situation after discharge:  Yes,  home At discharge, do you have transportation home?: Yes,  pt's mother Do you have the ability to pay for your medications: Yes,  mental health  Release of information consent forms completed and in the chart;  Patient's signature needed at discharge.  Patient to Follow up at: Follow-up Information    Follow up with [facility]. Go on 09/21/2015.   Why:  Walk in between 8am-10am on this date for counseling/therapy assessment. Please bring: Photo ID, Social [employer], and if you have it, [employer]. Please be prepared to stay for a few hours to complete process and paperwork. Thank you!   Contact information:   [address] [place]  [address] Telephone:  [phone]      Follow up with Dr. Janna Arch MD-Medication Management On 09/30/2015.   Why:  Appt on this date at 9:00AM with Dr. Janna Arch for medication management/hospital follow-up.    Contact information:   [address] [place] [address] Phone: [phone] Fax: [phone]      Next level of care provider has access to [facility] Link:no  Safety Planning and Suicide Prevention discussed: Yes,  SPE completed with pt's mother. SPI pamphlet and mobile crisis information provided to pt.   Have you used any form of tobacco in the last 30 days? (Cigarettes, Smokeless Tobacco, Cigars, and/or Pipes): Yes  Has patient been referred to the Quitline?: Patient refused referral  Patient has been referred for addiction treatment: Yes-s/a classes at [facility] and counseling.   Smart, Bacilio Abascal LCSW 09/17/2015, 10:29 AM
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
## 2015-09-17 NOTE — Progress Notes (Signed)
Discharge note:  Patient discharged home per MD order.  Patient received all personal belongings from unit and locker.  Reviewed AVS/discharge/medications/prescriptions/followup appointments.  Patient indicated understanding.  She denied SI/HI/AVH.  Patient left in good spirits.  She left ambulatory with her mother.
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
## 2015-09-17 NOTE — [facility] Group Notes (Signed)
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
## 2016-01-02 ENCOUNTER — Emergency Department
Admission: EM | Admit: 2016-01-02 | Discharge: 2016-01-02 | Disposition: A | Discharge status: Home / Self Care | Attending: Emergency Medicine | Admitting: Emergency Medicine

## 2016-01-02 ENCOUNTER — Emergency Department

## 2016-01-02 ENCOUNTER — Other Ambulatory Visit: Payer: Self-pay

## 2016-01-02 DIAGNOSIS — F1490 Cocaine use, unspecified, uncomplicated: Secondary | ICD-10-CM | POA: Diagnosis not present

## 2016-01-02 DIAGNOSIS — R1013 Epigastric pain: Secondary | ICD-10-CM | POA: Diagnosis present

## 2016-01-02 DIAGNOSIS — F419 Anxiety disorder, unspecified: Secondary | ICD-10-CM | POA: Diagnosis not present

## 2016-01-02 DIAGNOSIS — M5136 Other intervertebral disc degeneration, lumbar region: Secondary | ICD-10-CM | POA: Insufficient documentation

## 2016-01-02 DIAGNOSIS — K219 Gastro-esophageal reflux disease without esophagitis: Secondary | ICD-10-CM

## 2016-01-02 DIAGNOSIS — F1290 Cannabis use, unspecified, uncomplicated: Secondary | ICD-10-CM | POA: Diagnosis not present

## 2016-01-02 DIAGNOSIS — Z79899 Other long term (current) drug therapy: Secondary | ICD-10-CM | POA: Diagnosis not present

## 2016-01-02 DIAGNOSIS — F332 Major depressive disorder, recurrent severe without psychotic features: Secondary | ICD-10-CM | POA: Insufficient documentation

## 2016-01-02 DIAGNOSIS — J441 Chronic obstructive pulmonary disease with (acute) exacerbation: Secondary | ICD-10-CM | POA: Diagnosis not present

## 2016-01-02 DIAGNOSIS — F17210 Nicotine dependence, cigarettes, uncomplicated: Secondary | ICD-10-CM | POA: Insufficient documentation

## 2016-01-02 LAB — COMPREHENSIVE METABOLIC PANEL
ALBUMIN: 4.4 g/dL (ref 3.5–5.0)
ALT: 12 U/L — AB (ref 14–54)
AST: 22 U/L (ref 15–41)
Alkaline Phosphatase: 75 U/L (ref 38–126)
Anion gap: 8 (ref 5–15)
BUN: 13 mg/dL (ref 6–20)
CHLORIDE: 108 mmol/L (ref 101–111)
CO2: 23 mmol/L (ref 22–32)
CREATININE: 1.1 mg/dL — AB (ref 0.44–1.00)
Calcium: 9.4 mg/dL (ref 8.9–10.3)
GFR calc Af Amer: >60 mL/min (ref >60)
GFR calc non Af Amer: 58 mL/min — ABNORMAL LOW (ref >60)
GLUCOSE: 147 mg/dL — AB (ref 65–99)
POTASSIUM: 4.3 mmol/L (ref 3.5–5.1)
Sodium: 139 mmol/L (ref 135–145)
Total Bilirubin: 0.5 mg/dL (ref 0.3–1.2)
Total Protein: 7.9 g/dL (ref 6.5–8.1)

## 2016-01-02 LAB — CBC
HEMATOCRIT: 46.6 % (ref 35.0–47.0)
Hemoglobin: 16 g/dL (ref 12.0–16.0)
MCH: 31.2 pg (ref 26.0–34.0)
MCHC: 34.3 g/dL (ref 32.0–36.0)
MCV: 90.8 fL (ref 80.0–100.0)
PLATELETS: 204 10*3/uL (ref 150–440)
RBC: 5.14 MIL/uL (ref 3.80–5.20)
RDW: 14.1 % (ref 11.5–14.5)
WBC: 14.2 10*3/uL — ABNORMAL HIGH (ref 3.6–11.0)

## 2016-01-02 LAB — LIPASE, BLOOD: LIPASE: 29 U/L (ref 11–51)

## 2016-01-02 LAB — TROPONIN I: Troponin I: <0.03 ng/mL (ref <0.031)

## 2016-01-02 MED ORDER — FAMOTIDINE 20 MG PO TABS: 20.0000 mg | ORAL_TABLET | Freq: Two times a day (BID) | ORAL | Status: AC

## 2016-01-02 MED ORDER — ONDANSETRON 4 MG PO TBDP
4.0000 mg | ORAL_TABLET | Freq: Once | ORAL | Status: AC
  Administered 2016-01-02: 4 mg via ORAL
  Filled 2016-01-02: qty 1

## 2016-01-02 MED ORDER — ONDANSETRON HCL 4 MG PO TABS: 4.0000 mg | ORAL_TABLET | Freq: Every day | ORAL | Status: AC | PRN

## 2016-01-02 MED ORDER — FAMOTIDINE 20 MG PO TABS
20.0000 mg | ORAL_TABLET | Freq: Once | ORAL | Status: AC
  Administered 2016-01-02: 20 mg via ORAL
  Filled 2016-01-02: qty 1

## 2016-01-02 NOTE — ED Provider Notes (Signed)
[facility] Emergency Department Provider Note        Time seen: ----------------------------------------- 12:09 PM on 01/02/2016 -----------------------------------------    I have reviewed the triage vital signs and the nursing notes.   HISTORY  Chief Complaint Abdominal Pain    HPI Kara Kelly is a 50 y.o. female who presents to ER for epigastric pain that radiated into her chest that started last night. Patient does not report a history of this before. She has vomited twice. Patient did stop a couple times on the way to her work to vomit. Patient tried drinking Coca-Cola without any improvement. She does report going to bed very angry, has a history of anxiety depression. She does not report anxiety attacks feeling like this in the past. Currently all of her symptoms are resolved, she has no abdominal pain or nausea.   Past Medical History  Diagnosis Date  . Panic attack   . Depression   . Endometriosis   . Bipolar 1 disorder (HCC)   . Headache(784.0)     daily  . Personality disorder   . Tuberculosis     been tested positive for it  . COPD (chronic obstructive pulmonary disease) (HCC)   . Pneumonia complicating pregnancy   . Pneumonia     chronic per lung doctor  . Chronic neck and back pain   . Migraine     Patient Active Problem List   Diagnosis Date Noted  . MDD (major depressive disorder), recurrent severe, without psychosis (HCC)   . Moderate benzodiazepine use disorder 09/12/2015  . Cocaine use disorder, moderate, dependence (HCC) 09/12/2015  . Cannabis use disorder, moderate, dependence (HCC) 09/12/2015  . Borderline personality disorder 09/12/2015  . PTSD (post-traumatic stress disorder) 09/12/2015  . Alcohol use disorder, moderate, dependence (HCC) 09/12/2015  . Substance induced mood disorder (HCC) 09/11/2015  . Intractable migraine 08/06/2014  . Tinea pedis 07/29/2014  . Boil 07/29/2014  . Chronic hepatitis C  without hepatic coma (HCC) 05/30/2014  . Kidney stone 01/08/2014  . DDD (degenerative disc disease), lumbar 10/09/2013  . Other and unspecified hyperlipidemia 10/08/2013  . Paresthesia 08/22/2013  . Incontinence 08/22/2013  . Pneumonia   . Acute sinusitis 08/12/2013  . COPD exacerbation (HCC) 08/12/2013  . GERD (gastroesophageal reflux disease) 08/07/2013  . Dysphagia, unspecified(787.20) 07/11/2013  . Difficulty urinating 07/11/2013  . Migraine 05/31/2013  . Chronic back pain 05/31/2013  . Bipolar 1 disorder, mixed, moderate (HCC) 05/31/2013  . COPD (chronic obstructive pulmonary disease) (HCC) 05/31/2013  . Personality disorder 05/31/2013  . Depressive disorder 05/30/2013  . Tobacco use 05/15/2013    Past Surgical History  Procedure Laterality Date  . Appendectomy    . Cholecystectomy    . Abdominal hysterectomy    . Neck surgery    . Cholecystectomy      Allergies Flexeril and Morphine and related  Social History Social History  Substance Use Topics  . Smoking status: Current Every Day Smoker -- 1.50 packs/day    Types: Cigarettes  . Smokeless tobacco: Never Used  . Alcohol Use: No    Review of Systems Constitutional: Positive for possible fever Cardiovascular: Positive for recent chest pain Respiratory: Negative for shortness of breath. Gastrointestinal: Positive for recent abdominal pain and vomiting Genitourinary: Negative for dysuria. Musculoskeletal: Negative for back pain. Skin: Negative for rash. Neurological: Negative for headaches, focal weakness or numbness. Psychiatric: Positive for recent stress and anxiety  10-point ROS otherwise negative.  ____________________________________________   PHYSICAL EXAM:  VITAL SIGNS:  ED Triage Vitals  Enc Vitals Group     BP 01/02/16 1047 102/66 mmHg     Pulse Rate 01/02/16 1047 75     Resp 01/02/16 1047 20     Temp 01/02/16 1047 97.7 F (36.5 C)     Temp Source 01/02/16 1047 Oral     SpO2 01/02/16  1047 100 %     Weight 01/02/16 1047 194 lb (87.998 kg)     Height 01/02/16 1047  (1.753 m)     Head Cir --      Peak Flow --      Pain Score 01/02/16 1050 8     Pain Loc --      Pain Edu? --      Excl. in GC? --     Constitutional: Alert and oriented. Well appearing and in no distress. Eyes: Conjunctivae are normal. PERRL. Normal extraocular movements. ENT   Head: Normocephalic and atraumatic.   Nose: No congestion/rhinnorhea.   Mouth/Throat: Mucous membranes are moist.   Neck: No stridor. Cardiovascular: Normal rate, regular rhythm. No murmurs, rubs, or gallops. Respiratory: Normal respiratory effort without tachypnea nor retractions. Breath sounds are clear and equal bilaterally. No wheezes/rales/rhonchi. Gastrointestinal: Soft and nontender. Normal bowel sounds Musculoskeletal: Nontender with normal range of motion in all extremities. No lower extremity tenderness nor edema. Neurologic:  Normal speech and language. No gross focal neurologic deficits are appreciated.  Skin:  Skin is warm, dry and intact. No rash noted. Psychiatric: Mood and affect are normal. Speech and behavior are normal.  ____________________________________________  EKG: Interpreted by me. Sinus rhythm with a rate of 71 bpm, normal PR interval, normal QRS width, normal QT interval. Normal axis.  ____________________________________________  ED COURSE:  Pertinent labs & imaging results that were available during my care of the patient were reviewed by me and considered in my medical decision making (see chart for details). Patient presents to ER with symptoms of GERD possibly with the addition of anxiety. She is currently feeling completely better. We will check basic labs and reevaluate. ____________________________________________    LABS (pertinent positives/negatives)  Labs Reviewed  COMPREHENSIVE METABOLIC PANEL - Abnormal; Notable for the following:    Glucose, Bld 147 (*)     Creatinine, Ser 1.10 (*)    ALT 12 (*)    GFR calc non Af Amer 58 (*)    All other components within normal limits  CBC - Abnormal; Notable for the following:    WBC 14.2 (*)    All other components within normal limits  LIPASE, BLOOD  TROPONIN I  URINALYSIS COMPLETEWITH MICROSCOPIC ([facility] ONLY)    RADIOLOGY  IMPRESSION: No active cardiopulmonary disease.  ____________________________________________  FINAL ASSESSMENT AND PLAN  Epigastric pain, anxiety  Plan: Patient with labs and imaging as dictated above. Symptoms likely secondary to GERD with the addition of anxiety or panic attack. She'll be started on Pepcid and Zofran. I will prescribe similar for her to take at home. She is stable for outpatient follow-up with her doctor.   Emily Filbert, MD   Note: This dictation was prepared with Dragon dictation. Any transcriptional errors that result from this process are unintentional   Emily Filbert, MD 01/02/16 1212

## 2016-01-02 NOTE — ED Notes (Signed)
Pt reports upper abd pain that radiates to chest that started last night. Pt reports vomiting twice. Denies diarrhea. Pt also reports low grade fever of 99.

## 2016-01-02 NOTE — Discharge Instructions (Signed)
Gastroesophageal Reflux Disease, Adult Normally, food travels down the esophagus and stays in the stomach to be digested. However, when a person has gastroesophageal reflux disease (GERD), food and stomach acid move back up into the esophagus. When this happens, the esophagus becomes sore and inflamed. Over time, GERD can create small holes (ulcers) in the lining of the esophagus.  CAUSES This condition is caused by a problem with the muscle between the esophagus and the stomach (lower esophageal sphincter, or LES). Normally, the LES muscle closes after food passes through the esophagus to the stomach. When the LES is weakened or abnormal, it does not close properly, and that allows food and stomach acid to go back up into the esophagus. The LES can be weakened by certain dietary substances, medicines, and medical conditions, including:  Tobacco use.  Pregnancy.  Having a hiatal hernia.  Heavy alcohol use.  Certain foods and beverages, such as coffee, chocolate, onions, and peppermint. RISK FACTORS This condition is more likely to develop in:  People who have an increased body weight.  People who have connective tissue disorders.  People who use NSAID medicines. SYMPTOMS Symptoms of this condition include:  Heartburn.  Difficult or painful swallowing.  The feeling of having a lump in the throat.  Abitter taste in the mouth.  Bad breath.  Having a large amount of saliva.  Having an upset or bloated stomach.  Belching.  Chest pain.  Shortness of breath or wheezing.  Ongoing (chronic) cough or a night-time cough.  Wearing away of tooth enamel.  Weight loss. Different conditions can cause chest pain. Make sure to see your health care provider if you experience chest pain. DIAGNOSIS Your health care provider will take a medical history and perform a physical exam. To determine if you have mild or severe GERD, your health care provider may also monitor how you respond  to treatment. You may also have other tests, including:  An endoscopy toexamine your stomach and esophagus with a small camera.  A test thatmeasures the acidity level in your esophagus.  A test thatmeasures how much pressure is on your esophagus.  A barium swallow or modified barium swallow to show the shape, size, and functioning of your esophagus. TREATMENT The goal of treatment is to help relieve your symptoms and to prevent complications. Treatment for this condition may vary depending on how severe your symptoms are. Your health care provider may recommend:  Changes to your diet.  Medicine.  Surgery. HOME CARE INSTRUCTIONS Diet  Follow a diet as recommended by your health care provider. This may involve avoiding foods and drinks such as:  Coffee and tea (with or without caffeine).  Drinks that containalcohol.  Energy drinks and sports drinks.  Carbonated drinks or sodas.  Chocolate and cocoa.  Peppermint and mint flavorings.  Garlic and onions.  Horseradish.  Spicy and acidic foods, including peppers, chili powder, curry powder, vinegar, hot sauces, and barbecue sauce.  Citrus fruit juices and citrus fruits, such as oranges, lemons, and limes.  Tomato-based foods, such as red sauce, chili, salsa, and pizza with red sauce.  Fried and fatty foods, such as donuts, french fries, potato chips, and high-fat dressings.  High-fat meats, such as hot dogs and fatty cuts of red and white meats, such as rib eye steak, sausage, ham, and bacon.  High-fat dairy items, such as whole milk, butter, and cream cheese.  Eat small, frequent meals instead of large meals.  Avoid drinking large amounts of liquid with your  meals.  Avoid eating meals during the 2-3 hours before bedtime.  Avoid lying down right after you eat.  Do not exercise right after you eat. General Instructions  Pay attention to any changes in your symptoms.  Take over-the-counter and prescription  medicines only as told by your health care provider. Do not take aspirin, ibuprofen, or other NSAIDs unless your health care provider told you to do so.  Do not use any tobacco products, including cigarettes, chewing tobacco, and e-cigarettes. If you need help quitting, ask your health care provider.  Wear loose-fitting clothing. Do not wear anything tight around your waist that causes pressure on your abdomen.  Raise (elevate) the head of your bed 6 inches (15cm).  Try to reduce your stress, such as with yoga or meditation. If you need help reducing stress, ask your health care provider.  If you are overweight, reduce your weight to an amount that is healthy for you. Ask your health care provider for guidance about a safe weight loss goal.  Keep all follow-up visits as told by your health care provider. This is important. SEEK MEDICAL CARE IF:  You have new symptoms.  You have unexplained weight loss.  You have difficulty swallowing, or it hurts to swallow.  You have wheezing or a persistent cough.  Your symptoms do not improve with treatment.  You have a hoarse voice. SEEK IMMEDIATE MEDICAL CARE IF:  You have pain in your arms, neck, jaw, teeth, or back.  You feel sweaty, dizzy, or light-headed.  You have chest pain or shortness of breath.  You vomit and your vomit looks like blood or coffee grounds.  You faint.  Your stool is bloody or black.  You cannot swallow, drink, or eat.   This information is not intended to replace advice given to you by your health care provider. Make sure you discuss any questions you have with your health care provider.   Document Released: 04/27/2005 Document Revised: 04/08/2015 Document Reviewed: 11/12/2014 Elsevier Interactive Patient Education 2016 Elsevier Inc.  Panic Attacks Panic attacks are sudden, short-livedsurges of severe anxiety, fear, or discomfort. They may occur for no reason when you are relaxed, when you are anxious,  or when you are sleeping. Panic attacks may occur for a number of reasons:   Healthy people occasionally have panic attacks in extreme, life-threatening situations, such as war or natural disasters. Normal anxiety is a protective mechanism of the body that helps [place] react to danger (fight or flight response).  Panic attacks are often seen with anxiety disorders, such as panic disorder, social anxiety disorder, generalized anxiety disorder, and phobias. Anxiety disorders cause excessive or uncontrollable anxiety. They may interfere with your relationships or other life activities.  Panic attacks are sometimes seen with other mental illnesses, such as depression and posttraumatic stress disorder.  Certain medical conditions, prescription medicines, and drugs of abuse can cause panic attacks. SYMPTOMS  Panic attacks start suddenly, peak within 20 minutes, and are accompanied by four or more of the following symptoms:  Pounding heart or fast heart rate (palpitations).  Sweating.  Trembling or shaking.  Shortness of breath or feeling smothered.  Feeling choked.  Chest pain or discomfort.  Nausea or strange feeling in your stomach.  Dizziness, light-headedness, or feeling like you will faint.  Chills or hot flushes.  Numbness or tingling in your lips or hands and feet.  Feeling that things are not real or feeling that you are not yourself.  Fear of losing control or  going crazy.  Fear of dying. Some of these symptoms can mimic serious medical conditions. For example, you may think you are having a heart attack. Although panic attacks can be very scary, they are not life threatening. DIAGNOSIS  Panic attacks are diagnosed through an assessment by your health care provider. Your health care provider will ask questions about your symptoms, such as where and when they occurred. Your health care provider will also ask about your medical history and use of alcohol and drugs, including  prescription medicines. Your health care provider may order blood tests or other studies to rule out a serious medical condition. Your health care provider may refer you to a mental health professional for further evaluation. TREATMENT   Most healthy people who have one or two panic attacks in an extreme, life-threatening situation will not require treatment.  The treatment for panic attacks associated with anxiety disorders or other mental illness typically involves counseling with a mental health professional, medicine, or a combination of both. Your health care provider will help determine what treatment is best for you.  Panic attacks due to physical illness usually go away with treatment of the illness. If prescription medicine is causing panic attacks, talk with your health care provider about stopping the medicine, decreasing the dose, or substituting another medicine.  Panic attacks due to alcohol or drug abuse go away with abstinence. Some adults need professional help in order to stop drinking or using drugs. HOME CARE INSTRUCTIONS   Take all medicines as directed by your health care provider.   Schedule and attend follow-up visits as directed by your health care provider. It is important to keep all your appointments. SEEK MEDICAL CARE IF:  You are not able to take your medicines as prescribed.  Your symptoms do not improve or get worse. SEEK IMMEDIATE MEDICAL CARE IF:   You experience panic attack symptoms that are different than your usual symptoms.  You have serious thoughts about hurting yourself or others.  You are taking medicine for panic attacks and have a serious side effect. MAKE SURE YOU:  Understand these instructions.  Will watch your condition.  Will get help right away if you are not doing well or get worse.   This information is not intended to replace advice given to you by your health care provider. Make sure you discuss any questions you have with  your health care provider.   Document Released: 07/18/2005 Document Revised: 07/23/2013 Document Reviewed: 03/01/2013 Elsevier Interactive Patient Education [employer]2016 Elsevier Inc.

## 2016-01-19 ENCOUNTER — Encounter (HOSPITAL_COMMUNITY): Payer: Self-pay | Admitting: *Deleted

## 2016-01-19 ENCOUNTER — Emergency Department (HOSPITAL_COMMUNITY)
Admission: EM | Admit: 2016-01-19 | Discharge: 2016-01-19 | Disposition: A | Discharge status: Home / Self Care | Attending: Emergency Medicine | Admitting: Emergency Medicine

## 2016-01-19 DIAGNOSIS — F329 Major depressive disorder, single episode, unspecified: Secondary | ICD-10-CM | POA: Insufficient documentation

## 2016-01-19 DIAGNOSIS — J449 Chronic obstructive pulmonary disease, unspecified: Secondary | ICD-10-CM | POA: Insufficient documentation

## 2016-01-19 DIAGNOSIS — N611 Abscess of the breast and nipple: Secondary | ICD-10-CM | POA: Diagnosis not present

## 2016-01-19 DIAGNOSIS — F17210 Nicotine dependence, cigarettes, uncomplicated: Secondary | ICD-10-CM | POA: Diagnosis not present

## 2016-01-19 MED ORDER — LIDOCAINE HCL (PF) 1 % IJ SOLN
INTRAMUSCULAR | Status: AC
  Filled 2016-01-19: qty 5

## 2016-01-19 MED ORDER — LIDOCAINE-EPINEPHRINE (PF) 2 %-1:200000 IJ SOLN
20.0000 mL | Freq: Once | INTRAMUSCULAR | Status: AC
  Administered 2016-01-19: 20 mL
  Filled 2016-01-19: qty 20

## 2016-01-19 NOTE — ED Notes (Signed)
Pt has abscess noted to her right breast.

## 2016-01-19 NOTE — Discharge Instructions (Signed)

## 2016-01-19 NOTE — ED Provider Notes (Signed)
CSN: [id number]     Arrival date & time 01/19/16  4098 History  By signing my name below, I, Rosario Adie, attest that this documentation has been prepared under the direction and in the presence of Azalia Bilis, MD.  Electronically Signed: Rosario Adie, ED Scribe. 01/19/2016. 8:13 AM.  Chief Complaint  Patient presents with  . Abscess   The history is provided by the patient. No language interpreter was used.   HPI Comments: Kara Kelly is a 50 y.o. female who presents to the Emergency Department complaining of a gradual onset, gradually worsening abscess to her R breast with associated pain onset 7 days ago. She has had an associated intermittent fever. Pt reports that she noticed discomfort to her R breast and R nipple x 7 days ago, and saw her PCP Dr. Oval Linsey for her the lump 6 days ago. She states that he prescribed her Levaquin. She has been compliant with her medications, and reports that the lump and her pain have not improved. She has also tried using warm compresses and hot tea bags with no relief. She also states that she has not been able to sleep well because of her pain. She reports that she does have a hx of boils, but notes that they are mostly confined to her groin area, and normally appear after shaving.   Past Medical History  Diagnosis Date  . Panic attack   . Depression   . Endometriosis   . Bipolar 1 disorder (HCC)   . Headache(784.0)     daily  . Personality disorder   . Tuberculosis     been tested positive for it  . COPD (chronic obstructive pulmonary disease) (HCC)   . Pneumonia complicating pregnancy   . Pneumonia     chronic per lung doctor  . Chronic neck and back pain   . Migraine    Past Surgical History  Procedure Laterality Date  . Appendectomy    . Cholecystectomy    . Abdominal hysterectomy    . Neck surgery    . Cholecystectomy     Family History  Problem Relation Age of Onset  . Kidney cancer Father    . Heart disease Mother   . Alcoholism Brother    Social History  Substance Use Topics  . Smoking status: Current Every Day Smoker -- 1.00 packs/day    Types: Cigarettes  . Smokeless tobacco: Never Used  . Alcohol Use: No   OB History    No data available     Review of Systems A complete 10 system review of systems was obtained and all systems are negative except as noted in the HPI and PMH.   Allergies  Flexeril and Morphine and related  Home Medications   Prior to Admission medications   Medication Sig Start Date End Date Taking? Authorizing Provider  albuterol (PROVENTIL HFA;VENTOLIN HFA) 108 (90 Base) MCG/ACT inhaler Inhale 2 puffs into the lungs every 6 (six) hours as needed for wheezing or shortness of breath. 09/17/15   Sanjuana Kava, NP  carbamazepine (TEGRETOL) 100 MG chewable tablet Chew 1 tablet (100 mg total) by mouth 3 (three) times daily. For mood stabilization 09/17/15   Sanjuana Kava, NP  clonazePAM (KLONOPIN) 1 MG tablet Take 1 tablet (1 mg total) by mouth 3 (three) times daily. For anxiety 09/17/15   Sanjuana Kava, NP  famotidine (PEPCID) 20 MG tablet Take 1 tablet (20 mg total) by mouth 2 (two) times daily.  01/02/16   Emily FilbertJonathan E Williams, MD  gabapentin (NEURONTIN) 400 MG capsule Take 2 capsules (800 mg total) by mouth 3 (three) times daily. For agitation/substance withdrawal syndrome 09/17/15   Sanjuana KavaAgnes I Nwoko, NP  nicotine (NICODERM CQ - DOSED IN MG/24 HOURS) 21 mg/24hr patch Place 1 patch (21 mg total) onto the skin daily. For smoking cessation 09/17/15   Sanjuana KavaAgnes I Nwoko, NP  ondansetron (ZOFRAN) 4 MG tablet Take 1 tablet (4 mg total) by mouth daily as needed for nausea or vomiting. 01/02/16   Emily FilbertJonathan E Williams, MD  prazosin (MINIPRESS) 2 MG capsule Take 1 capsule (2 mg total) by mouth at bedtime. For nightmares 09/17/15   Sanjuana KavaAgnes I Nwoko, NP  sertraline (ZOLOFT) 50 MG tablet Take 1 tablet (50 mg total) by mouth at bedtime. For depression 09/17/15   Sanjuana KavaAgnes I Nwoko, NP  silver  sulfADIAZINE (SILVADENE) 1 % cream Apply topically daily as needed (apply to lt arm : For wound care). 09/17/15   Sanjuana KavaAgnes I Nwoko, NP  topiramate (TOPAMAX) 50 MG tablet Take 1 tablet (50 mg total) by mouth at bedtime. For mood stabilization 09/17/15   Sanjuana KavaAgnes I Nwoko, NP  traZODone (DESYREL) 100 MG tablet Take 1 tablet (100 mg total) by mouth at bedtime as needed for sleep. 09/17/15   Sanjuana KavaAgnes I Nwoko, NP   BP 117/79 mmHg  Pulse 74  Temp(Src) 97.6 F (36.4 C) (Oral)  Resp 18  Ht 5\' 9"  (1.753 m)  Wt 188 lb (85.276 kg)  BMI 27.75 kg/m2  SpO2 96%   Physical Exam  Constitutional: She is oriented to person, place, and time. She appears well-developed and well-nourished.  HENT:  Head: Normocephalic.  Eyes: EOM are normal.  Neck: Normal range of motion.  Pulmonary/Chest: Effort normal. Right breast exhibits no nipple discharge.  Abscess noted in 9 o'clock region of R breast that has evidence of erythema with warmth and fluctuance w/o drainage. No discharge from the R nipple is present on exam.  Abdominal: She exhibits no distension.  Musculoskeletal: Normal range of motion.  Neurological: She is alert and oriented to person, place, and time.  Psychiatric: She has a normal mood and affect.  Nursing note and vitals reviewed.  ED Course  .Marland Kitchen.Incision and Drainage Performed by: Azalia BilisAMPOS, Haralambos Yeatts Authorized by: Azalia BilisAMPOS, Nicolas Banh   INCISION AND DRAINAGE Performed by: Lyanne CoAMPOS,Keshawna Dix M Consent: Verbal consent obtained. Risks and benefits: risks, benefits and alternatives were discussed Time out performed prior to procedure Type: abscess Body area: Right breast Anesthesia: local infiltration Incision was made with a scalpel. Local anesthetic: lidocaine 2 % with epinephrine Anesthetic total: 10 ml Complexity: complex Blunt dissection to break up loculations Drainage: purulent Drainage amount: Moderate  Packing material: None  Patient tolerance: Patient tolerated the procedure well with no immediate  complications.      DIAGNOSTIC STUDIES: Oxygen Saturation is 96% on RA, normal by my interpretation.   COORDINATION OF CARE: 8:06 AM-Discussed next steps with pt including I&D. Pt verbalized understanding and is agreeable with the plan.   MDM   Final diagnoses:  Abscess of right breast    Incision and drainage of abscess.  Patient has 7 days of Levaquin left.  She will continue with this.  She understands to return to the ER for new or worsening symptoms   I personally performed the services described in this documentation, which was scribed in my presence. The recorded information has been reviewed and is accurate.        Azalia BilisKevin Evelynne Spiers, MD  01/19/16 0824 

## 2016-01-19 NOTE — ED Notes (Signed)
MD at bedside. 

## 2016-01-19 NOTE — ED Notes (Signed)
Pt states seeing her primary care physician (Dr. Janna Archondiego) who prescribed levaquin.

## 2016-01-20 ENCOUNTER — Encounter (HOSPITAL_COMMUNITY): Payer: Self-pay

## 2016-01-20 ENCOUNTER — Emergency Department (HOSPITAL_COMMUNITY)
Admission: EM | Admit: 2016-01-20 | Discharge: 2016-01-20 | Disposition: A | Discharge status: Home / Self Care | Attending: Emergency Medicine | Admitting: Emergency Medicine

## 2016-01-20 DIAGNOSIS — Z79899 Other long term (current) drug therapy: Secondary | ICD-10-CM | POA: Insufficient documentation

## 2016-01-20 DIAGNOSIS — F17210 Nicotine dependence, cigarettes, uncomplicated: Secondary | ICD-10-CM | POA: Diagnosis not present

## 2016-01-20 DIAGNOSIS — N644 Mastodynia: Secondary | ICD-10-CM | POA: Diagnosis present

## 2016-01-20 DIAGNOSIS — F329 Major depressive disorder, single episode, unspecified: Secondary | ICD-10-CM | POA: Diagnosis not present

## 2016-01-20 DIAGNOSIS — J449 Chronic obstructive pulmonary disease, unspecified: Secondary | ICD-10-CM | POA: Diagnosis not present

## 2016-01-20 DIAGNOSIS — E876 Hypokalemia: Secondary | ICD-10-CM | POA: Diagnosis not present

## 2016-01-20 DIAGNOSIS — N611 Abscess of the breast and nipple: Secondary | ICD-10-CM | POA: Insufficient documentation

## 2016-01-20 LAB — BASIC METABOLIC PANEL
ANION GAP: 4 — AB (ref 5–15)
BUN: 14 mg/dL (ref 6–20)
CALCIUM: 8.6 mg/dL — AB (ref 8.9–10.3)
CO2: 23 mmol/L (ref 22–32)
Chloride: 111 mmol/L (ref 101–111)
Creatinine, Ser: 0.9 mg/dL (ref 0.44–1.00)
GFR calc non Af Amer: >60 mL/min (ref >60)
Glucose, Bld: 88 mg/dL (ref 65–99)
Potassium: 3.4 mmol/L — ABNORMAL LOW (ref 3.5–5.1)
Sodium: 138 mmol/L (ref 135–145)

## 2016-01-20 LAB — CBC WITH DIFFERENTIAL/PLATELET
BASOS ABS: 0 10*3/uL (ref 0.0–0.1)
BASOS PCT: 0 %
Eosinophils Absolute: 0.1 10*3/uL (ref 0.0–0.7)
Eosinophils Relative: 1 %
HEMATOCRIT: 40.5 % (ref 36.0–46.0)
HEMOGLOBIN: 14.1 g/dL (ref 12.0–15.0)
Lymphocytes Relative: 30 %
Lymphs Abs: 2.5 10*3/uL (ref 0.7–4.0)
MCH: 31.5 pg (ref 26.0–34.0)
MCHC: 34.8 g/dL (ref 30.0–36.0)
MCV: 90.4 fL (ref 78.0–100.0)
MONOS PCT: 6 %
Monocytes Absolute: 0.5 10*3/uL (ref 0.1–1.0)
NEUTROS ABS: 5.3 10*3/uL (ref 1.7–7.7)
NEUTROS PCT: 63 %
Platelets: 166 10*3/uL (ref 150–400)
RBC: 4.48 MIL/uL (ref 3.87–5.11)
RDW: 13.7 % (ref 11.5–15.5)
WBC: 8.4 10*3/uL (ref 4.0–10.5)

## 2016-01-20 MED ORDER — POTASSIUM CHLORIDE CRYS ER 20 MEQ PO TBCR
40.0000 meq | EXTENDED_RELEASE_TABLET | Freq: Once | ORAL | Status: AC
  Administered 2016-01-20: 40 meq via ORAL
  Filled 2016-01-20: qty 2

## 2016-01-20 MED ORDER — HYDROMORPHONE HCL 1 MG/ML IJ SOLN
1.0000 mg | Freq: Once | INTRAMUSCULAR | Status: AC
  Administered 2016-01-20: 1 mg via INTRAVENOUS
  Filled 2016-01-20: qty 1

## 2016-01-20 MED ORDER — TRAMADOL HCL 50 MG PO TABS: 50.0000 mg | ORAL_TABLET | Freq: Four times a day (QID) | ORAL | Status: AC | PRN

## 2016-01-20 MED ORDER — LIDOCAINE HCL (PF) 1 % IJ SOLN
INTRAMUSCULAR | Status: AC
  Filled 2016-01-20: qty 5

## 2016-01-20 MED ORDER — HYDROMORPHONE HCL 1 MG/ML IJ SOLN
0.5000 mg | Freq: Once | INTRAMUSCULAR | Status: AC
  Administered 2016-01-20: 0.5 mg via INTRAVENOUS
  Filled 2016-01-20: qty 1

## 2016-01-20 NOTE — ED Notes (Signed)
Pt seen yesterday and area to right breast was opened and drained. Pt reports increased pain at site and into right armpit

## 2016-01-20 NOTE — Consult Note (Signed)
SURGICAL CONSULTATION NOTE (initial)  HISTORY OF PRESENT ILLNESS (HPI):  50 y.o. female presented yesterday with Right breast pain and intermittent fever x 7 days, for which her PMD prescribed for her Levaquin that she's reportedly been taking. She denies any nipple discharge or preceding palpable breast mass, reports she is up to date with breast screening mammograms, and states the pain was not relieved with warm compresses. At presentation yesterday, she underwent incision and drainage of the fluctuant mass with return of purulent drainage. No packing was left, and patient returns today for recurrent pain and worsened cellulitis at the previously drained site.  PAST MEDICAL HISTORY (PMH):  Past Medical History  Diagnosis Date  . Panic attack   . Depression   . Endometriosis   . Bipolar 1 disorder (HCC)   . Headache(784.0)     daily  . Personality disorder   . Tuberculosis     been tested positive for it  . COPD (chronic obstructive pulmonary disease) (HCC)   . Pneumonia complicating pregnancy   . Pneumonia     chronic per lung doctor  . Chronic neck and back pain   . Migraine      PAST SURGICAL HISTORY [facility]):  Past Surgical History  Procedure Laterality Date  . Appendectomy    . Cholecystectomy    . Abdominal hysterectomy    . Neck surgery    . Cholecystectomy       MEDICATIONS:  Prior to Admission medications   Medication Sig Start Date End Date Taking? Authorizing Provider  albuterol (PROVENTIL HFA;VENTOLIN HFA) 108 (90 Base) MCG/ACT inhaler Inhale 2 puffs into the lungs every 6 (six) hours as needed for wheezing or shortness of breath. 09/17/15  Yes Sanjuana Kava, NP  carbamazepine (TEGRETOL) 100 MG chewable tablet Chew 1 tablet (100 mg total) by mouth 3 (three) times daily. For mood stabilization 09/17/15  Yes Sanjuana Kava, NP  clonazePAM (KLONOPIN) 1 MG tablet Take 1 tablet (1 mg total) by mouth 3 (three) times daily. For anxiety 09/17/15  Yes Sanjuana Kava, NP   famotidine (PEPCID) 20 MG tablet Take 1 tablet (20 mg total) by mouth 2 (two) times daily. 01/02/16  Yes Emily Filbert, MD  gabapentin (NEURONTIN) 400 MG capsule Take 2 capsules (800 mg total) by mouth 3 (three) times daily. For agitation/substance withdrawal syndrome 09/17/15  Yes Sanjuana Kava, NP  prazosin (MINIPRESS) 2 MG capsule Take 1 capsule (2 mg total) by mouth at bedtime. For nightmares 09/17/15  Yes Sanjuana Kava, NP  topiramate (TOPAMAX) 50 MG tablet Take 1 tablet (50 mg total) by mouth at bedtime. For mood stabilization 09/17/15  Yes Sanjuana Kava, NP  traZODone (DESYREL) 100 MG tablet Take 1 tablet (100 mg total) by mouth at bedtime as needed for sleep. 09/17/15  Yes Sanjuana Kava, NP  ondansetron (ZOFRAN) 4 MG tablet Take 1 tablet (4 mg total) by mouth daily as needed for nausea or vomiting. Patient not taking: Reported on 01/20/2016 01/02/16   Emily Filbert, MD  traMADol (ULTRAM) 50 MG tablet Take 1 tablet (50 mg total) by mouth every 6 (six) hours as needed. 01/20/16   Doug Sou, MD     ALLERGIES:  Allergies  Allergen Reactions  . Flexeril [Cyclobenzaprine] Other (See Comments)    Headaches. Doesn't like the way it makes her feel.   . Morphine And Related Other (See Comments)    Alters mental status: Makes violent     SOCIAL HISTORY:  Social History   Social History  . Marital Status: Single    Spouse Name: Onalee Hua  . Number of Children: 1  . Years of Education: GED   Occupational History  .      disabled   Social History Main Topics  . Smoking status: Current Every Day Smoker -- 1.00 packs/day    Types: Cigarettes  . Smokeless tobacco: Never Used  . Alcohol Use: No  . Drug Use: No     Comment: last use x 4 weeks ago, 09-24-13- PT REPORTS HX OF Cocaine Use, last use reported was in 2005  . Sexual Activity: Yes    Birth Control/ Protection: None   Other Topics Concern  . Not on file   Social History Narrative   Patient lives at home with her husband  Onalee Hua)    Disabled   Education GED   Right handed.   Caffeine- one glass of sweet daily.    The patient currently resides (home / rehab facility / nursing home): Home  The patient normally is (ambulatory / bedbound): Ambulatory   FAMILY HISTORY:  Family History  Problem Relation Age of Onset  . Kidney cancer Father   . Heart disease Mother   . Alcoholism Brother     REVIEW OF SYSTEMS:  Constitutional: denies weight loss, fever, chills, or sweats  Eyes: denies any other vision changes, history of eye injury  ENT: denies sore throat, hearing problems  Respiratory: denies shortness of breath, wheezing  Cardiovascular: denies chest pain, palpitations  Gastrointestinal: denies abdominal pain, N/V, or diarrhea  Musculoskeletal: denies any other joint pains or cramps  Skin: Right breast abscess/wound as per HPI, denies any other rashes or skin discolorations  Neurological: denies any other headache, dizziness, weakness  Psychiatric: denies any other depression, anxiety   All other review of systems were negative   VITAL SIGNS:  Temp:  [98.1 F (36.7 C)-98.2 F (36.8 C)] 98.1 F (36.7 C) (06/21 1530) Pulse Rate:  [62-76] 66 (06/21 1830) Resp:  [18-20] 18 (06/21 1655) BP: (111-128)/(56-93) 111/79 mmHg (06/21 1830) SpO2:  [93 %-100 %] 99 % (06/21 1830) Weight:  [85.276 kg (188 lb)] 85.276 kg (188 lb) (06/21 1331)     Height: 5\' 9"  (175.3 cm) Weight: 85.276 kg (188 lb) BMI (Calculated): 27.8   INTAKE/OUTPUT:  This shift:    Last 2 shifts: @IOLAST2SHIFTS @   PHYSICAL EXAM:  Constitutional:  -- Normal body habitus  -- Awake, alert, and oriented x3  Eyes:  -- Pupils equally round and reactive to light  -- No scleral icterus  Ear, nose, and throat:  -- No jugular venous distension  Pulmonary:  -- No crackles  -- Equal breath sounds bilaterally  Cardiovascular:  -- S1, S2 present  -- No pericardial rubs Abdomen:  -- Soft, nontender, nondistended, no guarding/rebound  --  No abdominal masses appreciated, pulsatile or otherwise  Musculoskeletal / Integumentary:  -- Wounds or skin discoloration: erythematous, indurated, and tender Right breast wound with concern for fluctuance s/p closure of recent abscess incision and drainage in Emergency Department  -- Extremities: B/L UE and LE FROM, hands and feet warm, no edema  Neurologic:  -- Motor function: intact and symmetric -- Sensation: intact and symmetric  Labs:  CBC:  Lab Results  Component Value Date   WBC 8.4 01/20/2016   RBC 4.48 01/20/2016     Imaging studies:  No pertinent imaging studies  Assessment/Plan:  50 y.o. female with Right breast abscess, complicated by pertinent comorbidities  including anxiety, personality disorder, chronic neck and back pain, tobacco abuse, and COPD.   - complete prescribed course of oral antibiotics   - abscess cavity re-opened and packed after informed consent obtained  - case management consult ordered for home health wound care (gauze packing changes daily)  - follow-up in surgery office in 1 week to assess progress/wound care/improvement  - routine outpatient mammogram following resolution of current acute process  - smoking cessation strongly advised and discussed  All of the above findings and recommendations were discussed with the patient, and all of her questions were answered to her expressed satisfaction.  Thank you for the opportunity to participate in the care for this patient.   -- Scherrie GerlachJason E. Earlene Plateravis, MD [place]: [facility] Office: [phone]

## 2016-01-20 NOTE — ED Provider Notes (Addendum)
CSN: [id number]     Arrival date & time 01/20/16  1306 History   First MD Initiated Contact with Patient 01/20/16 1459     Chief Complaint  Patient presents with  . Wound Check     (Consider location/radiation/quality/duration/timing/severity/associated sxs/prior Treatment) HPI Complains of right breast pain for a week. Pain is nonradiating. Patient was seen by Dr.Dondiego with Levaquin. Seen here yesterday had incision and drainage performed by Dr. Patria Mane. Presents today his pain and redness or worse. Pain and redness localized to right breast. Worse when she moves her right arm. No other associated symptoms Past Medical History  Diagnosis Date  . Panic attack   . Depression   . Endometriosis   . Bipolar 1 disorder (HCC)   . Headache(784.0)     daily  . Personality disorder   . Tuberculosis     been tested positive for it  . COPD (chronic obstructive pulmonary disease) (HCC)   . Pneumonia complicating pregnancy   . Pneumonia     chronic per lung doctor  . Chronic neck and back pain   . Migraine    Past Surgical History  Procedure Laterality Date  . Appendectomy    . Cholecystectomy    . Abdominal hysterectomy    . Neck surgery    . Cholecystectomy     Family History  Problem Relation Age of Onset  . Kidney cancer Father   . Heart disease Mother   . Alcoholism Brother    Social History  Substance Use Topics  . Smoking status: Current Every Day Smoker -- 1.00 packs/day    Types: Cigarettes  . Smokeless tobacco: Never Used  . Alcohol Use: No   OB History    No data available     Review of Systems  Constitutional: Negative.   HENT: Negative.   Respiratory: Negative.   Cardiovascular: Negative.   Gastrointestinal: Negative.   Musculoskeletal: Negative.   Skin: Positive for wound.  Neurological: Negative.   Psychiatric/Behavioral: Negative.   All other systems reviewed and are negative.     Allergies  Flexeril and Morphine and related  Home  Medications   Prior to Admission medications   Medication Sig Start Date End Date Taking? Authorizing Provider  albuterol (PROVENTIL HFA;VENTOLIN HFA) 108 (90 Base) MCG/ACT inhaler Inhale 2 puffs into the lungs every 6 (six) hours as needed for wheezing or shortness of breath. 09/17/15   Sanjuana Kava, NP  carbamazepine (TEGRETOL) 100 MG chewable tablet Chew 1 tablet (100 mg total) by mouth 3 (three) times daily. For mood stabilization 09/17/15   Sanjuana Kava, NP  clonazePAM (KLONOPIN) 1 MG tablet Take 1 tablet (1 mg total) by mouth 3 (three) times daily. For anxiety 09/17/15   Sanjuana Kava, NP  famotidine (PEPCID) 20 MG tablet Take 1 tablet (20 mg total) by mouth 2 (two) times daily. 01/02/16   Emily Filbert, MD  gabapentin (NEURONTIN) 400 MG capsule Take 2 capsules (800 mg total) by mouth 3 (three) times daily. For agitation/substance withdrawal syndrome 09/17/15   Sanjuana Kava, NP  nicotine (NICODERM CQ - DOSED IN MG/24 HOURS) 21 mg/24hr patch Place 1 patch (21 mg total) onto the skin daily. For smoking cessation 09/17/15   Sanjuana Kava, NP  ondansetron (ZOFRAN) 4 MG tablet Take 1 tablet (4 mg total) by mouth daily as needed for nausea or vomiting. 01/02/16   Emily Filbert, MD  prazosin (MINIPRESS) 2 MG capsule Take 1 capsule (2 mg total)  by mouth at bedtime. For nightmares 09/17/15   Sanjuana KavaAgnes I Nwoko, NP  sertraline (ZOLOFT) 50 MG tablet Take 1 tablet (50 mg total) by mouth at bedtime. For depression 09/17/15   Sanjuana KavaAgnes I Nwoko, NP  silver sulfADIAZINE (SILVADENE) 1 % cream Apply topically daily as needed (apply to lt arm : For wound care). 09/17/15   Sanjuana KavaAgnes I Nwoko, NP  topiramate (TOPAMAX) 50 MG tablet Take 1 tablet (50 mg total) by mouth at bedtime. For mood stabilization 09/17/15   Sanjuana KavaAgnes I Nwoko, NP  traZODone (DESYREL) 100 MG tablet Take 1 tablet (100 mg total) by mouth at bedtime as needed for sleep. 09/17/15   Sanjuana KavaAgnes I Nwoko, NP   BP 128/81 mmHg  Pulse 62  Temp(Src) 98.2 F (36.8 C) (Oral)   Resp 20  Ht 5\' 9"  (1.753 m)  Wt 188 lb (85.276 kg)  BMI 27.75 kg/m2  SpO2 96% Physical Exam  Constitutional: She appears well-developed and well-nourished.  HENT:  Head: Normocephalic and atraumatic.  Eyes: Conjunctivae are normal. Pupils are equal, round, and reactive to light.  Neck: Neck supple. No tracheal deviation present. No thyromegaly present.  Cardiovascular: Normal rate and regular rhythm.   No murmur heard. Pulmonary/Chest: Effort normal and breath sounds normal.  Right breast tennis  ball reddened area with incision at Center immediately lateral to nipple. Area is swollen and warm and tender. Nipple appears normal  Abdominal: Soft. Bowel sounds are normal. She exhibits no distension. There is no tenderness.  Musculoskeletal: Normal range of motion. She exhibits no edema or tenderness.  Neurological: She is alert. Coordination normal.  Skin: Skin is warm and dry. No rash noted.  Psychiatric: She has a normal mood and affect.  Nursing note and vitals reviewed.   ED Course  Procedures (including critical care time) Labs Review Labs Reviewed - No data to display  Imaging Review No results found. I have personally reviewed and evaluated these images and lab results as part of my medical decision-making.   EKG Interpretation None     6:15 PM pain improved after treatment with intravenous hydromorphone Results for orders placed or performed during the hospital encounter of 01/20/16  CBC with Differential/Platelet  Result Value Ref Range   WBC 8.4 4.0 - 10.5 K/uL   RBC 4.48 3.87 - 5.11 MIL/uL   Hemoglobin 14.1 12.0 - 15.0 g/dL   HCT 81.140.5 91.436.0 - 78.246.0 %   MCV 90.4 78.0 - 100.0 fL   MCH 31.5 26.0 - 34.0 pg   MCHC 34.8 30.0 - 36.0 g/dL   RDW 95.613.7 21.311.5 - 08.615.5 %   Platelets 166 150 - 400 K/uL   Neutrophils Relative % 63 %   Neutro Abs 5.3 1.7 - 7.7 K/uL   Lymphocytes Relative 30 %   Lymphs Abs 2.5 0.7 - 4.0 K/uL   Monocytes Relative 6 %   Monocytes Absolute 0.5  0.1 - 1.0 K/uL   Eosinophils Relative 1 %   Eosinophils Absolute 0.1 0.0 - 0.7 K/uL   Basophils Relative 0 %   Basophils Absolute 0.0 0.0 - 0.1 K/uL  Basic metabolic panel  Result Value Ref Range   Sodium 138 135 - 145 mmol/L   Potassium 3.4 (L) 3.5 - 5.1 mmol/L   Chloride 111 101 - 111 mmol/L   CO2 23 22 - 32 mmol/L   Glucose, Bld 88 65 - 99 mg/dL   BUN 14 6 - 20 mg/dL   Creatinine, Ser 5.780.90 0.44 - 1.00 mg/dL  Calcium 8.6 (L) 8.9 - 10.3 mg/dL   GFR calc non Af Amer >60 >60 mL/min   GFR calc Af Amer >60 >60 mL/min   Anion gap 4 (L) 5 - 15   Dg Chest 2 View  01/02/2016  CLINICAL DATA:  Mid chest pain, shortness of breath, fever. Upper abdominal pain. EXAM: CHEST  2 VIEW COMPARISON:  03/27/2014 FINDINGS: Heart and mediastinal contours are within normal limits. No focal opacities or effusions. No acute bony abnormality. Mild compression fracture in the lower thoracic spine, stable. IMPRESSION: No active cardiopulmonary disease. Electronically Signed   By: Charlett Nose M.D.   On: 01/02/2016 11:24   Dr. Earlene Plater incised the right breast in the ED. He reports to me that there was no frank pus however he did pack the wound. He suggests prescription Percocet. He has ordered home health care. He will see the patient in the office one week. Oral potassium ordered. 7:40 PM patient states pain is improved after treatment with intravenous hydromorphone. She'll receive another dose of IV hydromorphone prior to discharge. MDM  Right breast abscesses inadequately drained. I consulted Dr. Earlene Plater, surgeon who will evaluate patient in emergency department for incision and drainage. Prescription Tramadol. Finish prior prescription of Levaquin Final diagnoses:  None  Dx #1 right breast abscess #2hypokalemia      Doug Sou, MD 01/20/16 1944  Doug Sou, MD 01/20/16 1945  Doug Sou, MD 01/20/16 1949

## 2016-01-20 NOTE — Discharge Instructions (Signed)
Call Dr. Jinny Sandersavis's office tomorrow to schedule an appointment for one week from now. Take pain medicine as prescribed, or take Tylenol as directed for mild pain. Don't take the pain medicine prescribed together with Klonopin (clonazepam) or with alcohol as the combination can be dangerous. Finish your Levaquin as prescribed.

## 2016-01-20 NOTE — ED Notes (Signed)
Dr. Davis into see pt.

## 2016-01-25 ENCOUNTER — Emergency Department (HOSPITAL_COMMUNITY)
Admission: EM | Admit: 2016-01-25 | Discharge: 2016-01-25 | Disposition: A | Discharge status: Home / Self Care | Attending: Emergency Medicine | Admitting: Emergency Medicine

## 2016-01-25 ENCOUNTER — Encounter (HOSPITAL_COMMUNITY): Payer: Self-pay | Admitting: Emergency Medicine

## 2016-01-25 DIAGNOSIS — F319 Bipolar disorder, unspecified: Secondary | ICD-10-CM | POA: Diagnosis not present

## 2016-01-25 DIAGNOSIS — N611 Abscess of the breast and nipple: Secondary | ICD-10-CM | POA: Diagnosis present

## 2016-01-25 DIAGNOSIS — J449 Chronic obstructive pulmonary disease, unspecified: Secondary | ICD-10-CM | POA: Insufficient documentation

## 2016-01-25 DIAGNOSIS — Z4801 Encounter for change or removal of surgical wound dressing: Secondary | ICD-10-CM | POA: Diagnosis not present

## 2016-01-25 DIAGNOSIS — F17210 Nicotine dependence, cigarettes, uncomplicated: Secondary | ICD-10-CM | POA: Insufficient documentation

## 2016-01-25 DIAGNOSIS — Z79899 Other long term (current) drug therapy: Secondary | ICD-10-CM | POA: Insufficient documentation

## 2016-01-25 DIAGNOSIS — Z5189 Encounter for other specified aftercare: Secondary | ICD-10-CM

## 2016-01-25 LAB — CBC WITH DIFFERENTIAL/PLATELET
BASOS ABS: 0 10*3/uL (ref 0.0–0.1)
BASOS PCT: 0 %
EOS ABS: 0.1 10*3/uL (ref 0.0–0.7)
EOS PCT: 1 %
HCT: 41.3 % (ref 36.0–46.0)
Hemoglobin: 14 g/dL (ref 12.0–15.0)
Lymphocytes Relative: 44 %
Lymphs Abs: 3.4 10*3/uL (ref 0.7–4.0)
MCH: 30.3 pg (ref 26.0–34.0)
MCHC: 33.9 g/dL (ref 30.0–36.0)
MCV: 89.4 fL (ref 78.0–100.0)
MONO ABS: 0.5 10*3/uL (ref 0.1–1.0)
Monocytes Relative: 6 %
NEUTROS ABS: 3.8 10*3/uL (ref 1.7–7.7)
Neutrophils Relative %: 49 %
PLATELETS: 185 10*3/uL (ref 150–400)
RBC: 4.62 MIL/uL (ref 3.87–5.11)
RDW: 13.5 % (ref 11.5–15.5)
WBC: 7.7 10*3/uL (ref 4.0–10.5)

## 2016-01-25 LAB — BASIC METABOLIC PANEL
ANION GAP: 6 (ref 5–15)
BUN: 15 mg/dL (ref 6–20)
CALCIUM: 8.6 mg/dL — AB (ref 8.9–10.3)
CO2: 21 mmol/L — ABNORMAL LOW (ref 22–32)
Chloride: 110 mmol/L (ref 101–111)
Creatinine, Ser: 0.87 mg/dL (ref 0.44–1.00)
Glucose, Bld: 98 mg/dL (ref 65–99)
Potassium: 3.2 mmol/L — ABNORMAL LOW (ref 3.5–5.1)
SODIUM: 137 mmol/L (ref 135–145)

## 2016-01-25 MED ORDER — HYDROCODONE-ACETAMINOPHEN 5-325 MG PO TABS
1.0000 | ORAL_TABLET | Freq: Once | ORAL | Status: AC
  Administered 2016-01-25: 1 via ORAL
  Filled 2016-01-25: qty 1

## 2016-01-25 NOTE — ED Notes (Signed)
Patient had abscess to right breast two weeks ago and is complaining of area "scabbing over and hurting really bad." States she has packing in abscess and "the scab is coming back over it again." States she has been treated here for same.

## 2016-01-25 NOTE — Discharge Instructions (Signed)
Continue to put packing in the wound. Follow-up with your surgeon in the office tomorrow. They will reevaluate the wound.

## 2016-01-25 NOTE — ED Provider Notes (Signed)
CSN: [id number]     Arrival date & time 01/25/16  1515 History   First MD Initiated Contact with Patient 01/25/16 1700     Chief Complaint  Patient presents with  . Abscess     (Consider location/radiation/quality/duration/timing/severity/associated sxs/prior Treatment) Patient is a 50 y.o. female presenting with abscess. The history is provided by the patient.  Abscess Associated symptoms: no fever   Patient with concern for recurrent infection to site of incision and drainage of right breast abscess. Patient in the emergency department had the right breast abscess drained on June 20. Seen in consultation by general surgery on June 21 the wound was reopened thinking there may still be another pocket of fluctuance. But there was no significant return of pulse. Patient's been undergoing of packing of the wound since then. Patient still being followed by Dr. Earlene Plateravis from general surgery. Patient is concerned that there is recurrent infection causes additional pain to lateral aspect of the wound.  Past Medical History  Diagnosis Date  . Panic attack   . Depression   . Endometriosis   . Bipolar 1 disorder (HCC)   . Headache(784.0)     daily  . Personality disorder   . Tuberculosis     been tested positive for it  . COPD (chronic obstructive pulmonary disease) (HCC)   . Pneumonia complicating pregnancy   . Pneumonia     chronic per lung doctor  . Chronic neck and back pain   . Migraine    Past Surgical History  Procedure Laterality Date  . Appendectomy    . Cholecystectomy    . Abdominal hysterectomy    . Neck surgery    . Cholecystectomy     Family History  Problem Relation Age of Onset  . Kidney cancer Father   . Heart disease Mother   . Alcoholism Brother    Social History  Substance Use Topics  . Smoking status: Current Every Day Smoker -- 1.00 packs/day    Types: Cigarettes  . Smokeless tobacco: Never Used  . Alcohol Use: No   OB History    No data available      Review of Systems  Constitutional: Negative for fever.  HENT: Negative for congestion.   Respiratory: Negative for shortness of breath.   Cardiovascular: Positive for chest pain.  Gastrointestinal: Negative for abdominal pain.  Skin: Positive for wound.  Allergic/Immunologic: Negative for immunocompromised state.  Hematological: Does not bruise/bleed easily.      Allergies  Flexeril and Morphine and related  Home Medications   Prior to Admission medications   Medication Sig Start Date End Date Taking? Authorizing Provider  albuterol (PROVENTIL HFA;VENTOLIN HFA) 108 (90 Base) MCG/ACT inhaler Inhale 2 puffs into the lungs every 6 (six) hours as needed for wheezing or shortness of breath. 09/17/15  Yes Sanjuana KavaAgnes I Nwoko, NP  carbamazepine (TEGRETOL) 100 MG chewable tablet Chew 1 tablet (100 mg total) by mouth 3 (three) times daily. For mood stabilization 09/17/15  Yes Sanjuana KavaAgnes I Nwoko, NP  clonazePAM (KLONOPIN) 1 MG tablet Take 1 tablet (1 mg total) by mouth 3 (three) times daily. For anxiety 09/17/15  Yes Sanjuana KavaAgnes I Nwoko, NP  famotidine (PEPCID) 20 MG tablet Take 1 tablet (20 mg total) by mouth 2 (two) times daily. 01/02/16  Yes Emily FilbertJonathan E Williams, MD  gabapentin (NEURONTIN) 400 MG capsule Take 2 capsules (800 mg total) by mouth 3 (three) times daily. For agitation/substance withdrawal syndrome 09/17/15  Yes Sanjuana KavaAgnes I Nwoko, NP  prazosin (MINIPRESS)  2 MG capsule Take 1 capsule (2 mg total) by mouth at bedtime. For nightmares 09/17/15  Yes Sanjuana KavaAgnes I Nwoko, NP  topiramate (TOPAMAX) 50 MG tablet Take 1 tablet (50 mg total) by mouth at bedtime. For mood stabilization 09/17/15  Yes Sanjuana KavaAgnes I Nwoko, NP  traMADol (ULTRAM) 50 MG tablet Take 1 tablet (50 mg total) by mouth every 6 (six) hours as needed. 01/20/16  Yes Doug SouSam Jacubowitz, MD  traZODone (DESYREL) 100 MG tablet Take 1 tablet (100 mg total) by mouth at bedtime as needed for sleep. 09/17/15  Yes Sanjuana KavaAgnes I Nwoko, NP  ondansetron (ZOFRAN) 4 MG tablet Take 1 tablet  (4 mg total) by mouth daily as needed for nausea or vomiting. Patient not taking: Reported on 01/20/2016 01/02/16   Emily FilbertJonathan E Williams, MD   BP 119/88 mmHg  Pulse 75  Temp(Src) 98.3 F (36.8 C) (Temporal)  Resp 16  Ht 5\' 9"  (1.753 m)  Wt 85.276 kg  BMI 27.75 kg/m2  SpO2 99% Physical Exam  Constitutional: She is oriented to person, place, and time. She appears well-developed and well-nourished. No distress.  HENT:  Head: Normocephalic and atraumatic.  Mouth/Throat: Oropharynx is clear and moist.  Eyes: Conjunctivae and EOM are normal. Pupils are equal, round, and reactive to light.  Pulmonary/Chest: Effort normal and breath sounds normal. No respiratory distress.  Right breast area lateral to the nipple area with a 2 cm open wound laterally there is about a 3-4 cm area of redness with induration no evidence of any fluctuance. The open wound seems to be healing well. No significant purulent discharge.  Abdominal: Soft. Bowel sounds are normal. There is no tenderness.  Neurological: She is alert and oriented to person, place, and time. No cranial nerve deficit. She exhibits normal muscle tone. Coordination normal.  Nursing note and vitals reviewed.   ED Course  Procedures (including critical care time) Labs Review Labs Reviewed  BASIC METABOLIC PANEL - Abnormal; Notable for the following:    Potassium 3.2 (*)    CO2 21 (*)    Calcium 8.6 (*)    All other components within normal limits  CBC WITH DIFFERENTIAL/PLATELET    Imaging Review No results found. I have personally reviewed and evaluated these images and lab results as part of my medical decision-making.   EKG Interpretation None      MDM   Final diagnoses:  Breast abscess  Wound check, abscess    Patient status post I&D of right breast abscess on June 20. Evaluated by Dr. Earlene Plateravis general surgery June 21 and he reopened the wound but did not get any significant return of additional pus. Wound is clean treated with  packing. Discussed with Dr. Earlene Plateravis today. He will see her in the office tomorrow. Patient concerned about some lateral redness and induration no evidence of any significant fluctuation here today or additional pus pockets. Wound seems to be healing well.    Vanetta MuldersScott Illa Enlow, MD 01/25/16 Rickey [id number]

## 2016-03-01 DEATH — deceased
